# Patient Record
Sex: Female | Born: 1995
Health system: Southern US, Community
[De-identification: ages and names within clinical notes are randomized; demographics above are authoritative.]

## PROBLEM LIST (undated history)

## (undated) DIAGNOSIS — E119 Type 2 diabetes mellitus without complications: Secondary | ICD-10-CM

## (undated) DIAGNOSIS — R519 Headache, unspecified: Secondary | ICD-10-CM

## (undated) DIAGNOSIS — J353 Hypertrophy of tonsils with hypertrophy of adenoids: Secondary | ICD-10-CM

## (undated) DIAGNOSIS — R51 Headache: Secondary | ICD-10-CM

## (undated) DIAGNOSIS — R030 Elevated blood-pressure reading, without diagnosis of hypertension: Secondary | ICD-10-CM

## (undated) DIAGNOSIS — F419 Anxiety disorder, unspecified: Secondary | ICD-10-CM

## (undated) DIAGNOSIS — O24419 Gestational diabetes mellitus in pregnancy, unspecified control: Secondary | ICD-10-CM

## (undated) DIAGNOSIS — T7840XA Allergy, unspecified, initial encounter: Secondary | ICD-10-CM

## (undated) HISTORY — DX: Anxiety disorder, unspecified: F41.9

## (undated) HISTORY — PX: WISDOM TOOTH EXTRACTION: SHX21

## (undated) HISTORY — DX: Type 2 diabetes mellitus without complications: E11.9

## (undated) HISTORY — DX: Gestational diabetes mellitus in pregnancy, unspecified control: O24.419

---
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
## 2017-01-01 ENCOUNTER — Encounter: Payer: Self-pay | Admitting: Family Medicine

## 2017-02-01 ENCOUNTER — Encounter: Payer: Self-pay | Admitting: Family Medicine

## 2017-03-26 ENCOUNTER — Encounter: Payer: Self-pay | Admitting: Family Medicine

## 2017-08-03 ENCOUNTER — Other Ambulatory Visit: Payer: Self-pay

## 2017-08-03 ENCOUNTER — Ambulatory Visit (HOSPITAL_COMMUNITY)
Admission: EM | Admit: 2017-08-03 | Discharge: 2017-08-03 | Disposition: A | Discharge status: Home / Self Care | Payer: 59 | Attending: Internal Medicine | Admitting: Internal Medicine

## 2017-08-03 ENCOUNTER — Encounter (HOSPITAL_COMMUNITY): Payer: Self-pay | Admitting: *Deleted

## 2017-08-03 DIAGNOSIS — J029 Acute pharyngitis, unspecified: Secondary | ICD-10-CM | POA: Insufficient documentation

## 2017-08-03 DIAGNOSIS — Z91040 Latex allergy status: Secondary | ICD-10-CM | POA: Insufficient documentation

## 2017-08-03 DIAGNOSIS — Z833 Family history of diabetes mellitus: Secondary | ICD-10-CM | POA: Diagnosis not present

## 2017-08-03 DIAGNOSIS — Z841 Family history of disorders of kidney and ureter: Secondary | ICD-10-CM | POA: Diagnosis not present

## 2017-08-03 DIAGNOSIS — Z8249 Family history of ischemic heart disease and other diseases of the circulatory system: Secondary | ICD-10-CM | POA: Insufficient documentation

## 2017-08-03 DIAGNOSIS — R05 Cough: Secondary | ICD-10-CM | POA: Insufficient documentation

## 2017-08-03 HISTORY — DX: Headache, unspecified: R51.9

## 2017-08-03 HISTORY — DX: Headache: R51

## 2017-08-03 LAB — POCT RAPID STREP A: STREPTOCOCCUS, GROUP A SCREEN (DIRECT): NEGATIVE

## 2017-08-03 MED ORDER — BENZONATATE 200 MG PO CAPS: 200.0000 mg | ORAL_CAPSULE | Freq: Three times a day (TID) | ORAL | 1 refills | Status: DC | PRN

## 2017-08-03 NOTE — Discharge Instructions (Addendum)
Strep test was negative at the urgent care today; a throat culture is pending.  The urgent care will contact you if further treatment is needed.  If your symptoms seem most likely to be viral.  Anticipate gradual improvement over the next several days.  Recheck for new fever >100.5, increasing phlegm production/nasal discharge, or if not starting to improve in a few days.  Note for work today and tomorrow.  Prescription for benzonatate, a cough drop, was sent to the pharmacy.

## 2017-08-03 NOTE — ED Triage Notes (Signed)
C/O starting with sore throat 2 days ago with cough, painful talking.  Unsure if fevers.  Has been taking Robitussin.

## 2017-08-05 LAB — CULTURE, GROUP A STREP (THRC)

## 2017-08-07 NOTE — ED Provider Notes (Signed)
[facility]    CSN: [id number] Arrival date & time: 08/03/17  1455     History   Chief Complaint Chief Complaint  Patient presents with  . Sore Throat    HPI Susan Branch is a 21 y.o. female. presents with 2d hx irritated sore throat, cough, hoarseness.  Some congestion.  Not febrile.    HPI  Past Medical History:  Diagnosis Date  . Allergy   . Anxiety   . Borderline hypertension    no current med.  Marland Kitchen. Headache   . Tonsillar and adenoid hypertrophy 05/2013   snores during sleep, mother denies apnea    Patient Active Problem List   Diagnosis Date Noted  . Contraceptive management 06/20/2016  . Constipation 06/16/2014  . Obesity 04/22/2014  . Acne vulgaris 04/22/2014  . Migraine headache 04/22/2014  . Routine infant or child health check 04/22/2014  . Irregular menses 04/22/2014    Past Surgical History:  Procedure Laterality Date  . TONSILLECTOMY AND ADENOIDECTOMY N/A 06/02/2013   Procedure: TONSILLECTOMY AND ADENOIDECTOMY;  Surgeon: Darletta MollSui W Teoh, MD;  Location: [facility];  Service: ENT;  Laterality: N/A;  . TURBINATE REDUCTION Bilateral 06/02/2013   Procedure: TURBINATE REDUCTION BILATERAL;  Surgeon: Darletta MollSui W Teoh, MD;  Location: [facility];  Service: ENT;  Laterality: Bilateral;    OB History    No data available       Home Medications    Prior to Admission medications   Medication Sig Start Date End Date Taking? Authorizing Provider  eletriptan (RELPAX) 40 MG tablet One tablet by mouth at onset of headache. May repeat in 2 hours if headache persists or recurs. 06/20/16  Yes [place], Velna HatchetKawanta F, MD  amitriptyline (ELAVIL) 25 MG tablet Take 1 tablet (25 mg total) by mouth at bedtime. 06/20/16   [place], Velna HatchetKawanta F, MD  benzonatate (TESSALON) 200 MG capsule Take 1 capsule (200 mg total) by mouth 3 (three) times daily as needed for cough. 08/03/17   Eustace MooreMurray, Keionna Kinnaird W, MD    Family History Family History  Problem Relation  Age of Onset  . Diabetes Mother   . Diabetes Father   . Hypertension Father   . Heart disease Father        MI  . Hyperlipidemia Father   . Kidney disease Father   . Vision loss Father   . Vision loss Maternal Grandmother     Social History Social History   Tobacco Use  . Smoking status: Passive Smoke Exposure - Never Smoker  . Smokeless tobacco: Never Used  . Tobacco comment: father smokes outside  Substance Use Topics  . Alcohol use: No  . Drug use: No     Allergies   Adhesive [tape]; Latex; and Soap   Review of Systems Review of Systems  All other systems reviewed and are negative.    Physical Exam Triage Vital Signs ED Triage Vitals [08/03/17 1551]  Enc Vitals Group     BP 126/81     Pulse Rate 71     Resp 16     Temp 98.2 F (36.8 C)     Temp Source Oral     SpO2 100 %     Weight      Height      Head Circumference      Peak Flow      Pain Score 4     Pain Loc      Pain Edu?  Excl. in GC?    No data found.  Updated Vital Signs BP 126/81   Pulse 71   Temp 98.2 F (36.8 C) (Oral)   Resp 16   LMP 07/03/2017 (Approximate)   SpO2 100%   Visual Acuity Right Eye Distance:   Left Eye Distance:   Bilateral Distance:    Right Eye Near:   Left Eye Near:    Bilateral Near:     Physical Exam  Constitutional: She is oriented to person, place, and time. No distress.  HENT:  Head: Atraumatic.  B TMs mildly dull, no erythema Mild to mod nasal congestion Throat slighly injected   Eyes:  Conjugate gaze observed, no eye redness/discharge  Neck: Neck supple.  Cardiovascular: Normal rate and regular rhythm.  Pulmonary/Chest: No respiratory distress. She has no wheezes. She has no rales.  Lungs clear, symmetric breath sounds   Abdominal: She exhibits no distension.  Musculoskeletal: Normal range of motion.  Neurological: She is alert and oriented to person, place, and time.  Skin: Skin is warm and dry.  Nursing note and vitals  reviewed.    UC Treatments / Results  Labs Results for orders placed or performed during the hospital encounter of 08/03/17  Culture, group A strep  Result Value Ref Range   Specimen Description THROAT    Special Requests NONE    Culture NO GROUP A STREP (S.PYOGENES) ISOLATED    Report Status 08/05/2017 FINAL   POCT rapid strep A [facility]  Result Value Ref Range   Streptococcus, Group A Screen (Direct) NEGATIVE NEGATIVE    Procedures Procedures (including critical care time) None today  Final Clinical Impressions(s) / UC Diagnoses   Final diagnoses:  Acute pharyngitis, unspecified etiology   Strep test was negative at the urgent care today; a throat culture is pending.  The urgent care will contact you if further treatment is needed.  If your symptoms seem most likely to be viral.  Anticipate gradual improvement over the next several days.  Recheck for new fever >100.5, increasing phlegm production/nasal discharge, or if not starting to improve in a few days.  Note for work today and tomorrow.  Prescription for benzonatate, a cough drop, was sent to the pharmacy.  ED Discharge Orders        Ordered    benzonatate (TESSALON) 200 MG capsule  3 times daily PRN     08/03/17 1707       Controlled Substance Prescriptions [place] Controlled Substance Registry consulted? No   Eustace MooreMurray, Josetta Wigal W, MD 08/07/17 [id number]

## 2017-09-25 ENCOUNTER — Encounter: Payer: Self-pay | Admitting: Family Medicine

## 2017-09-25 ENCOUNTER — Other Ambulatory Visit: Payer: Self-pay

## 2017-09-25 ENCOUNTER — Ambulatory Visit (INDEPENDENT_AMBULATORY_CARE_PROVIDER_SITE_OTHER): Payer: 59 | Admitting: Family Medicine

## 2017-09-25 VITALS — BP 128/74 | HR 76 | Temp 98.7°F | Resp 16 | Ht 64.0 in | Wt 251.0 lb

## 2017-09-25 DIAGNOSIS — F321 Major depressive disorder, single episode, moderate: Secondary | ICD-10-CM

## 2017-09-25 DIAGNOSIS — Z6841 Body Mass Index (BMI) 40.0 and over, adult: Secondary | ICD-10-CM | POA: Diagnosis not present

## 2017-09-25 DIAGNOSIS — Z0000 Encounter for general adult medical examination without abnormal findings: Secondary | ICD-10-CM | POA: Diagnosis not present

## 2017-09-25 DIAGNOSIS — G43009 Migraine without aura, not intractable, without status migrainosus: Secondary | ICD-10-CM

## 2017-09-25 DIAGNOSIS — Z30011 Encounter for initial prescription of contraceptive pills: Secondary | ICD-10-CM | POA: Diagnosis not present

## 2017-09-25 DIAGNOSIS — E7439 Other disorders of intestinal carbohydrate absorption: Secondary | ICD-10-CM | POA: Diagnosis not present

## 2017-09-25 DIAGNOSIS — M222X2 Patellofemoral disorders, left knee: Secondary | ICD-10-CM | POA: Diagnosis not present

## 2017-09-25 DIAGNOSIS — M222X1 Patellofemoral disorders, right knee: Secondary | ICD-10-CM

## 2017-09-25 DIAGNOSIS — Z23 Encounter for immunization: Secondary | ICD-10-CM | POA: Diagnosis not present

## 2017-09-25 LAB — PREGNANCY, URINE: Preg Test, Ur: NEGATIVE

## 2017-09-25 MED ORDER — NORGESTIMATE-ETH ESTRADIOL 0.25-35 MG-MCG PO TABS: 1.0000 | ORAL_TABLET | Freq: Every day | ORAL | 11 refills | Status: DC

## 2017-09-25 MED ORDER — ELETRIPTAN HYDROBROMIDE 40 MG PO TABS: ORAL_TABLET | ORAL | 2 refills | Status: DC

## 2017-09-25 NOTE — Patient Instructions (Addendum)
F/U 3 months  We will call with orders Referral to therapy  Restart the birth control pill Work on diet, try not to eat a burgers/frieds, fried foods
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
## 2017-09-25 NOTE — Assessment & Plan Note (Signed)
Irregular menses which is long-term for her.  Urine pregnancy test negative here in the office.  Will restart Sprintec which worked for her before.

## 2017-09-25 NOTE — Assessment & Plan Note (Signed)
She finds medications.  Believe she is afraid because of her father and his response to medications and the fact that he has bipolar.  She has also the stress of not being able to go back to college until the summer.  She is in agreement with seeing a psychotherapist.  Then to discontinue the amitriptyline because of the side effects.

## 2017-09-25 NOTE — Assessment & Plan Note (Signed)
Utilize Relpax as needed

## 2017-09-26 LAB — CBC WITH DIFFERENTIAL/PLATELET
BASOS PCT: 0.5 %
Basophils Absolute: 53 cells/uL (ref 0–200)
Eosinophils Absolute: 158 cells/uL (ref 15–500)
Eosinophils Relative: 1.5 %
HCT: 41.5 % (ref 35.0–45.0)
HEMOGLOBIN: 13.5 g/dL (ref 11.7–15.5)
Lymphs Abs: 2867 cells/uL (ref 850–3900)
MCH: 27.6 pg (ref 27.0–33.0)
MCHC: 32.5 g/dL (ref 32.0–36.0)
MCV: 84.9 fL (ref 80.0–100.0)
MPV: 11.1 fL (ref 7.5–12.5)
Monocytes Relative: 6.5 %
Neutro Abs: 6741 cells/uL (ref 1500–7800)
Neutrophils Relative %: 64.2 %
Platelets: 408 10*3/uL — ABNORMAL HIGH (ref 140–400)
RBC: 4.89 10*6/uL (ref 3.80–5.10)
RDW: 12.6 % (ref 11.0–15.0)
TOTAL LYMPHOCYTE: 27.3 %
WBC: 10.5 10*3/uL (ref 3.8–10.8)
WBCMIX: 683 {cells}/uL (ref 200–950)

## 2017-09-26 LAB — COMPREHENSIVE METABOLIC PANEL
AG Ratio: 1.5 (calc) (ref 1.0–2.5)
ALT: 12 U/L (ref 5–32)
AST: 12 U/L (ref 12–32)
Albumin: 4.2 g/dL (ref 3.6–5.1)
Alkaline phosphatase (APISO): 75 U/L (ref 47–176)
BUN: 15 mg/dL (ref 7–20)
CO2: 27 mmol/L (ref 20–32)
Calcium: 9.4 mg/dL (ref 8.9–10.4)
Chloride: 104 mmol/L (ref 98–110)
Creat: 0.77 mg/dL (ref 0.50–1.00)
GLUCOSE: 99 mg/dL (ref 65–99)
Globulin: 2.8 g/dL (calc) (ref 2.0–3.8)
Potassium: 4.1 mmol/L (ref 3.8–5.1)
SODIUM: 139 mmol/L (ref 135–146)
TOTAL PROTEIN: 7 g/dL (ref 6.3–8.2)
Total Bilirubin: 0.3 mg/dL (ref 0.2–1.1)

## 2017-09-26 LAB — LIPID PANEL
CHOLESTEROL: 192 mg/dL — AB (ref <170)
HDL: 45 mg/dL — ABNORMAL LOW (ref >45)
LDL CHOLESTEROL (CALC): 130 mg/dL — AB (ref <110)
Non-HDL Cholesterol (Calc): 147 mg/dL (calc) — ABNORMAL HIGH (ref <120)
TRIGLYCERIDES: 75 mg/dL (ref <90)
Total CHOL/HDL Ratio: 4.3 (calc) (ref <5.0)

## 2017-09-26 LAB — HEMOGLOBIN A1C
EAG (MMOL/L): 6.8 (calc)
Hgb A1c MFr Bld: 5.9 % of total Hgb — ABNORMAL HIGH (ref <5.7)
Mean Plasma Glucose: 123 (calc)

## 2017-09-26 LAB — TSH: TSH: 2.06 mIU/L

## 2017-10-03 ENCOUNTER — Other Ambulatory Visit: Payer: Self-pay | Admitting: *Deleted

## 2017-10-03 MED ORDER — METFORMIN HCL 500 MG PO TABS: 500.0000 mg | ORAL_TABLET | Freq: Every day | ORAL | 3 refills | Status: DC

## 2017-10-30 ENCOUNTER — Other Ambulatory Visit: Payer: Self-pay

## 2017-10-30 ENCOUNTER — Encounter: Payer: Self-pay | Admitting: Family Medicine

## 2017-10-30 ENCOUNTER — Ambulatory Visit (INDEPENDENT_AMBULATORY_CARE_PROVIDER_SITE_OTHER): Payer: 59 | Admitting: Family Medicine

## 2017-10-30 ENCOUNTER — Telehealth: Payer: Self-pay

## 2017-10-30 DIAGNOSIS — S161XXA Strain of muscle, fascia and tendon at neck level, initial encounter: Secondary | ICD-10-CM

## 2017-10-30 MED ORDER — CYCLOBENZAPRINE HCL 5 MG PO TABS: 5.0000 mg | ORAL_TABLET | Freq: Three times a day (TID) | ORAL | 0 refills | Status: DC | PRN

## 2017-10-30 MED ORDER — IBUPROFEN 600 MG PO TABS: 600.0000 mg | ORAL_TABLET | Freq: Three times a day (TID) | ORAL | 0 refills | Status: DC | PRN

## 2017-10-30 NOTE — Patient Instructions (Addendum)
Take ibuprofen for pain  Use heating pad Muscle relaxer F/U as needed

## 2017-10-30 NOTE — Telephone Encounter (Signed)
Call placed to patient to inform her that her [employer] acknowledgement of donor Participation in the Plasmapheresis form is available for pick up at the front desk. LVM
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
## 2017-12-25 ENCOUNTER — Ambulatory Visit: Payer: 59 | Admitting: Family Medicine

## 2018-02-14 ENCOUNTER — Other Ambulatory Visit: Payer: Self-pay | Admitting: Family Medicine

## 2018-04-18 ENCOUNTER — Encounter: Payer: Self-pay | Admitting: Family Medicine

## 2018-04-18 ENCOUNTER — Ambulatory Visit (INDEPENDENT_AMBULATORY_CARE_PROVIDER_SITE_OTHER): Payer: 59 | Admitting: Family Medicine

## 2018-04-18 ENCOUNTER — Other Ambulatory Visit: Payer: Self-pay

## 2018-04-18 VITALS — BP 128/68 | HR 80 | Temp 98.4°F | Resp 12 | Ht 64.0 in | Wt 235.0 lb

## 2018-04-18 DIAGNOSIS — Z30017 Encounter for initial prescription of implantable subdermal contraceptive: Secondary | ICD-10-CM

## 2018-04-18 DIAGNOSIS — Z6841 Body Mass Index (BMI) 40.0 and over, adult: Secondary | ICD-10-CM

## 2018-04-18 DIAGNOSIS — R7303 Prediabetes: Secondary | ICD-10-CM | POA: Diagnosis not present

## 2018-04-18 DIAGNOSIS — Z113 Encounter for screening for infections with a predominantly sexual mode of transmission: Secondary | ICD-10-CM

## 2018-04-18 DIAGNOSIS — G43009 Migraine without aura, not intractable, without status migrainosus: Secondary | ICD-10-CM

## 2018-04-18 DIAGNOSIS — Z3009 Encounter for other general counseling and advice on contraception: Secondary | ICD-10-CM | POA: Diagnosis not present

## 2018-04-18 LAB — PREGNANCY, URINE: PREG TEST UR: NEGATIVE

## 2018-04-18 MED ORDER — PROPRANOLOL HCL ER 60 MG PO CP24: 60.0000 mg | ORAL_CAPSULE | Freq: Every day | ORAL | 3 refills | Status: DC

## 2018-04-18 MED ORDER — CYCLOBENZAPRINE HCL 10 MG PO TABS: 10.0000 mg | ORAL_TABLET | Freq: Three times a day (TID) | ORAL | 0 refills | Status: DC | PRN

## 2018-04-18 MED ORDER — KETOROLAC TROMETHAMINE 60 MG/2ML IM SOLN
60.0000 mg | Freq: Once | INTRAMUSCULAR | Status: AC
  Administered 2018-04-18: 60 mg via INTRAMUSCULAR

## 2018-04-18 NOTE — Assessment & Plan Note (Signed)
Has lost some weight.  She is cutting back on portion size.  Continue to cut out the junk food she does have borderline diabetes.  We will recheck her A1c she has strong family history of diabetes and parents

## 2018-04-18 NOTE — Patient Instructions (Signed)
Take inderal once a day  Stop the amitriptyline Use muscle relaxer as needed Toradol given for headache We will call with lab results  Referral to GYN for Nexplanon placement  F/U 2 months

## 2018-04-18 NOTE — Assessment & Plan Note (Addendum)
Side effect with Topamax.  The amitriptyline is not helping.  She wants something that will not make her sleepy as she will be restarting college.  We will try her on Inderal once a day Flexeril as needed for the tension migraines that she gets in neck discomfort.  Relpax will still be her breakthrough medication.  Discussed increasing her hydration cutting out the junk food salty foods that could be triggers.  Toradol given in office

## 2018-04-18 NOTE — Progress Notes (Signed)
Subjective:    Patient ID: Susan Branch, female    DOB: 01-Feb-1996, 22 y.o.   MRN: [id number]  Patient presents for Migraine (frequent HA) and Contraceptive Management (has not been taking her OBC but would like to resume) Pt here to discuss migraines   Headaches have been getting worse past few months. Has been using Relpax. If she gets severe migraine gets blurry vision/ photophobic, unable to function.   She had 1 event she was at an am[place]ement park and vision went black , she ate something and rested and it improved. She is on elavil this was given by [facility] wellness a couple years ago. Tried topamax in past side effects Has a headache currently, has had for past 5 days, no vomiting, +nausea Gets tension in back of head    NOt on metformin has borderline DM   Attends [employer]   Weight continues to decresae, she has cut down portions    Sexually active 1 partner, wants to restart her birth control   Review Of Systems:  GEN- denies fatigue, fever, weight loss,weakness, recent illness HEENT- denies eye drainage, change in vision, nasal discharge, CVS- denies chest pain, palpitations RESP- denies SOB, cough, wheeze ABD- denies N/V, change in stools, abd pain GU- denies dysuria, hematuria, dribbling, incontinence MSK- denies joint pain, muscle aches, injury Neuro- + headache, dizziness, syncope, seizure activity       Objective:    BP 128/68   Pulse 80   Temp 98.4 F (36.9 C) (Oral)   Resp 12   Ht 5\' 4"  (1.626 m)   Wt 235 lb (106.6 kg)   SpO2 97%   BMI 40.34 kg/m  GEN- NAD, alert and oriented x3 HEENT- PERRL, EOMI, non injected sclera, pink conjunctiva, MMM, oropharynx clear Neck- Supple, no thyromegaly CVS- RRR, no murmur RESP-CTAB Neuro-CNII-XII in tact no focal deficits  EXT- No edema Pulses- Radial, DP- 2+        Assessment & Plan:      Problem List Items Addressed This Visit      Unprioritized   Borderline diabetes   Relevant Orders   Basic metabolic panel   Hemoglobin A1c   Contraceptive management - Primary   Relevant Orders   Pregnancy, urine (Completed)   Ambulatory referral to Gynecology   Migraine headache    Side effect with Topamax.  The amitriptyline is not helping.  She wants something that will not make her sleepy as she will be restarting college.  We will try her on Inderal once a day Flexeril as needed for the tension migraines that she gets in neck discomfort.  Relpax will still be her breakthrough medication.  Discussed increasing her hydration cutting out the junk food salty foods that could be triggers.  Toradol given in office      Relevant Medications   cyclobenzaprine (FLEXERIL) 10 MG tablet   propranolol ER (INDERAL LA) 60 MG 24 hr capsule   ketorolac (TORADOL) injection 60 mg (Completed)   Obesity    Has lost some weight.  She is cutting back on portion size.  Continue to cut out the junk food she does have borderline diabetes.  We will recheck her A1c she has strong family history of diabetes and parents       Other Visit Diagnoses    Screen for STD (sexually transmitted disease)       Relevant Orders   C. trachomatis/N. gonorrhoeae RNA   HIV antibody   RPR  Note: This dictation was prepared with Dragon dictation along with smaller phrase technology. Any transcriptional errors that result from this process are unintentional.

## 2018-04-21 LAB — HEMOGLOBIN A1C
EAG (MMOL/L): 6.6 (calc)
Hgb A1c MFr Bld: 5.8 % of total Hgb — ABNORMAL HIGH (ref <5.7)
Mean Plasma Glucose: 120 (calc)

## 2018-04-21 LAB — HIV ANTIBODY (ROUTINE TESTING W REFLEX): HIV: NONREACTIVE

## 2018-04-21 LAB — BASIC METABOLIC PANEL
BUN: 11 mg/dL (ref 7–25)
CO2: 27 mmol/L (ref 20–32)
Calcium: 9.3 mg/dL (ref 8.6–10.2)
Chloride: 102 mmol/L (ref 98–110)
Creat: 0.93 mg/dL (ref 0.50–1.10)
Glucose, Bld: 100 mg/dL — ABNORMAL HIGH (ref 65–99)
Potassium: 4.1 mmol/L (ref 3.5–5.3)
SODIUM: 141 mmol/L (ref 135–146)

## 2018-04-21 LAB — C. TRACHOMATIS/N. GONORRHOEAE RNA
C. trachomatis RNA, TMA: NOT DETECTED
N. gonorrhoeae RNA, TMA: NOT DETECTED

## 2018-04-21 LAB — RPR: RPR: NONREACTIVE

## 2018-04-25 ENCOUNTER — Encounter: Payer: Self-pay | Admitting: *Deleted

## 2018-04-25 MED ORDER — METFORMIN HCL 500 MG PO TABS: 500.0000 mg | ORAL_TABLET | Freq: Every day | ORAL | 3 refills | Status: DC

## 2018-05-06 ENCOUNTER — Encounter: Payer: Self-pay | Admitting: Obstetrics and Gynecology

## 2018-05-06 ENCOUNTER — Other Ambulatory Visit: Payer: Self-pay

## 2018-05-06 ENCOUNTER — Ambulatory Visit: Payer: 59 | Admitting: Obstetrics and Gynecology

## 2018-05-06 VITALS — BP 137/92 | HR 79 | Ht 64.0 in | Wt 233.0 lb

## 2018-05-06 DIAGNOSIS — Z3009 Encounter for other general counseling and advice on contraception: Secondary | ICD-10-CM

## 2018-05-06 NOTE — Progress Notes (Signed)
[facility] Visit  05/06/18            Patient name: Susan Branch MRN [id number]  Date of birth: 30-Apr-1996  CC & HPI:  Susan Branch is a 22 y.o. female presenting today to discuss various types of contraception methods. She voices that she would like a nexplanon. She has been using condoms as her birth control method at this time. Patient's last menstrual period was 04/22/2018 (within days). She denies any other symptoms.    ROS:  ROS  All systems are negative except as noted in the HPI and PMH.   Pertinent History Reviewed:   Reviewed: Significant for borderline HTN Medical         Past Medical History:  Diagnosis Date  . Allergy   . Anxiety   . Borderline hypertension    no current med.  Marland Kitchen. Headache   . Tonsillar and adenoid hypertrophy 05/2013   snores during sleep, mother denies apnea                              Surgical Hx:    Past Surgical History:  Procedure Laterality Date  . TONSILLECTOMY AND ADENOIDECTOMY N/A 06/02/2013   Procedure: TONSILLECTOMY AND ADENOIDECTOMY;  Surgeon: Darletta MollSui W Teoh, MD;  Location: [facility];  Service: ENT;  Laterality: N/A;  . TURBINATE REDUCTION Bilateral 06/02/2013   Procedure: TURBINATE REDUCTION BILATERAL;  Surgeon: Darletta MollSui W Teoh, MD;  Location: [facility];  Service: ENT;  Laterality: Bilateral;   Medications: Reviewed & Updated - see associated section                       Current Outpatient Medications:  .  cyclobenzaprine (FLEXERIL) 10 MG tablet, Take 1 tablet (10 mg total) by mouth 3 (three) times daily as needed for muscle spasms., Disp: 30 tablet, Rfl: 0 .  eletriptan (RELPAX) 40 MG tablet, One tablet by mouth at onset of headache. May repeat in 2 hours if headache persists or recurs., Disp: 10 tablet, Rfl: 2 .  propranolol ER (INDERAL LA) 60 MG 24 hr capsule, Take 1 capsule (60 mg total) by mouth daily. For migraines, Disp: 30 capsule, Rfl: 3 .  metFORMIN (GLUCOPHAGE) 500 MG tablet, Take 1  tablet (500 mg total) by mouth daily with breakfast. (Patient not taking: Reported on 05/06/2018), Disp: 30 tablet, Rfl: 3   Social History: Reviewed -  reports that she is a non-smoker but has been exposed to tobacco smoke. She has never used smokeless tobacco.  Objective Findings:  Vitals: Blood pressure (!) 137/92, pulse 79, height 5\' 4"  (1.626 m), weight 233 lb (105.7 kg), last menstrual period 04/22/2018.  PHYSICAL EXAMINATION General appearance - alert, well appearing, and in no distress and oriented to person, place, and time Mental status - alert, oriented to person, place, and time, normal mood, behavior, speech, dress, motor activity, and thought processes  Discussion: 1. Discussed with pt risks and benefits of contraceptive management, nexplanon candidate  At end of discussion, pt had opportunity to ask questions and has no further questions at this time.   Specific discussion of contraceptive management, nexplanon candidate as noted above. Greater than 50% was spent in counseling and coordination of care with the patient.   Total time greater than: 15 minutes.      Assessment & Plan:   A:  1.  Contraception management, nexplanon candidate  P:  1. Obtain nexplanon through insurance at next menses in 2 weeks     By signing my name below, I, Soijett Blue, attest that this documentation has been prepared under the direction and in the presence of Tilda Burrow, MD. Electronically Signed: Soijett Blue, Stage manager. 05/06/18. 11:26 AM.  I personally performed the services described in this documentation, which was SCRIBED in my presence. The recorded information has been reviewed and considered accurate. It has been edited as necessary during review. Tilda Burrow, MD

## 2018-05-16 ENCOUNTER — Ambulatory Visit: Payer: 59 | Admitting: Obstetrics and Gynecology

## 2018-05-16 ENCOUNTER — Encounter: Payer: Self-pay | Admitting: Obstetrics and Gynecology

## 2018-05-16 VITALS — BP 139/88 | HR 77 | Ht 64.0 in | Wt 236.4 lb

## 2018-05-16 DIAGNOSIS — Z3046 Encounter for surveillance of implantable subdermal contraceptive: Secondary | ICD-10-CM | POA: Diagnosis not present

## 2018-05-16 DIAGNOSIS — Z3202 Encounter for pregnancy test, result negative: Secondary | ICD-10-CM | POA: Diagnosis not present

## 2018-05-16 DIAGNOSIS — Z30017 Encounter for initial prescription of implantable subdermal contraceptive: Secondary | ICD-10-CM

## 2018-05-16 LAB — POCT URINE PREGNANCY: PREG TEST UR: NEGATIVE

## 2018-05-16 MED ORDER — ETONOGESTREL 68 MG ~~LOC~~ IMPL
68.0000 mg | DRUG_IMPLANT | Freq: Once | SUBCUTANEOUS | Status: AC
  Administered 2018-05-16: 68 mg via SUBCUTANEOUS

## 2018-05-16 NOTE — Progress Notes (Signed)
Patient ID: Susan Branch, female   DOB: Jul 22, 1996, 22 y.o.   MRN: [id number]     GYNECOLOGY OFFICE PROCEDURE NOTE  TYAH MERY is a 22 y.o. G0P0000 here for Nexplanon insertion.  No pap under 21.  No other gynecologic concerns. Has been reliant on condoms and pregnancy test was negative today. Her LMP was around 04/22/18. Reliable contraception , neg pregnancy test  Nexplanon Insertion Procedure Patient identified, informed consent performed, consent signed.   Patient does understand that irregular bleeding is a very common side effect of this medication. She was advised to have backup contraception for one week after placement. Pregnancy test in clinic today was negative.  Appropriate time out taken.  Patient's left arm was prepped and draped in the usual sterile fashion. The ruler used to measure and mark insertion area.  Patient was prepped with alcohol swab and then injected with 3 cc of 1% lidocaine.  She was prepped with betadine, Nexplanon removed from packaging,  Device confirmed in needle, then inserted full length of needle and withdrawn per handbook instructions. Nexplanon was able to palpated in the patient's arm; patient palpated the insert herself. There was minimal blood loss.  Patient insertion site covered with guaze and a pressure bandage to reduce any bruising.  The patient tolerated the procedure well and was given post procedure instructions.     By signing my name below, I, Arnette Norris, attest that this documentation has been prepared under the direction and in the presence of Tilda Burrow, MD. Electronically Signed: Arnette Norris Medical Scribe. 05/16/18. 12:05 PM.  I personally performed the services described in this documentation, which was SCRIBED in my presence. The recorded information has been reviewed and considered accurate. It has been edited as necessary during review. Tilda Burrow, MD

## 2018-06-18 ENCOUNTER — Ambulatory Visit: Payer: 59 | Admitting: Physician Assistant

## 2018-07-21 ENCOUNTER — Encounter (HOSPITAL_COMMUNITY): Payer: Self-pay

## 2018-07-21 ENCOUNTER — Ambulatory Visit (HOSPITAL_COMMUNITY)
Admission: EM | Admit: 2018-07-21 | Discharge: 2018-07-21 | Disposition: A | Discharge status: Home / Self Care | Payer: 59 | Attending: Family Medicine | Admitting: Family Medicine

## 2018-07-21 ENCOUNTER — Other Ambulatory Visit: Payer: Self-pay

## 2018-07-21 DIAGNOSIS — B9789 Other viral agents as the cause of diseases classified elsewhere: Secondary | ICD-10-CM | POA: Diagnosis not present

## 2018-07-21 DIAGNOSIS — Z833 Family history of diabetes mellitus: Secondary | ICD-10-CM | POA: Diagnosis not present

## 2018-07-21 DIAGNOSIS — E669 Obesity, unspecified: Secondary | ICD-10-CM | POA: Diagnosis not present

## 2018-07-21 DIAGNOSIS — F419 Anxiety disorder, unspecified: Secondary | ICD-10-CM | POA: Diagnosis not present

## 2018-07-21 DIAGNOSIS — J029 Acute pharyngitis, unspecified: Secondary | ICD-10-CM | POA: Diagnosis present

## 2018-07-21 DIAGNOSIS — J069 Acute upper respiratory infection, unspecified: Secondary | ICD-10-CM | POA: Insufficient documentation

## 2018-07-21 DIAGNOSIS — G43909 Migraine, unspecified, not intractable, without status migrainosus: Secondary | ICD-10-CM | POA: Insufficient documentation

## 2018-07-21 DIAGNOSIS — Z7984 Long term (current) use of oral hypoglycemic drugs: Secondary | ICD-10-CM | POA: Insufficient documentation

## 2018-07-21 DIAGNOSIS — Z7722 Contact with and (suspected) exposure to environmental tobacco smoke (acute) (chronic): Secondary | ICD-10-CM | POA: Diagnosis not present

## 2018-07-21 DIAGNOSIS — Z79899 Other long term (current) drug therapy: Secondary | ICD-10-CM | POA: Insufficient documentation

## 2018-07-21 DIAGNOSIS — R7303 Prediabetes: Secondary | ICD-10-CM | POA: Diagnosis not present

## 2018-07-21 DIAGNOSIS — N760 Acute vaginitis: Secondary | ICD-10-CM | POA: Diagnosis not present

## 2018-07-21 DIAGNOSIS — Z888 Allergy status to other drugs, medicaments and biological substances status: Secondary | ICD-10-CM | POA: Insufficient documentation

## 2018-07-21 DIAGNOSIS — I10 Essential (primary) hypertension: Secondary | ICD-10-CM | POA: Insufficient documentation

## 2018-07-21 DIAGNOSIS — F329 Major depressive disorder, single episode, unspecified: Secondary | ICD-10-CM | POA: Diagnosis not present

## 2018-07-21 DIAGNOSIS — Z8249 Family history of ischemic heart disease and other diseases of the circulatory system: Secondary | ICD-10-CM | POA: Insufficient documentation

## 2018-07-21 MED ORDER — DM-GUAIFENESIN ER 30-600 MG PO TB12: 2.0000 | ORAL_TABLET | Freq: Two times a day (BID) | ORAL | 0 refills | Status: AC

## 2018-07-21 MED ORDER — MOMETASONE FUROATE 50 MCG/ACT NA SUSP
2.0000 | Freq: Every day | NASAL | 12 refills | Status: DC
Start: 2018-07-21 — End: 2020-05-04

## 2018-07-21 MED ORDER — ACETAMINOPHEN 500 MG PO TABS: 500.0000 mg | ORAL_TABLET | Freq: Four times a day (QID) | ORAL | 0 refills | Status: DC | PRN

## 2018-07-21 NOTE — ED Triage Notes (Addendum)
Pt  Co sore throat and cough x 4 days. Pt states she would like to be tested for STDs , pt states she thinks she has a yeast infection 3 days.

## 2018-07-21 NOTE — Discharge Instructions (Signed)
Push fluids to ensure adequate hydration and keep secretions thin.  Tylenol and/or ibuprofen as needed for pain or fevers.  Mucinex dm may help with cough and secretions.  Daily nasal spray.  Will notify you of any positive findings from your vaginal swab and if any changes to treatment are needed.   If symptoms worsen or do not improve in the next week to return to be seen or to follow up with your PCP.

## 2018-07-21 NOTE — ED Provider Notes (Signed)
[facility]    CSN: [id number] Arrival date & time: 07/21/18  1115     History   Chief Complaint Chief Complaint  Patient presents with  . Cough  . Sore Throat    HPI CHINYERE GALIANO is a 22 y.o. female.   Ashyia presents with complaints of sore throat, headache, cough and congestion which started 3-4 days ago. Thick congestion. Non productive cough. No known fevers but felt warm yesterday. No gi/gu complaints. No rash or ear pain. No known ill contacts. Has taken OTC cold RX and benadryl which have minimally helped. Sore throat is worse at night and when she wakes. No asthma history. Smokes either marijuana or black and milds daily. Also states she has vaginal itching. No known discharge. Has two partners. Denies  Concerns for STD's. No sores or lesions. Has had some vaginal spotting since her period last week. Her periods have been irregular since getting a nexplanon implant in September. No abdominal pain. Hx of allergies, htn, headaches, depression, irregular menses, tonsillectomy.     ROS per HPI.      Past Medical History:  Diagnosis Date  . Allergy   . Anxiety   . Borderline hypertension    no current med.  Marland Kitchen Headache   . Tonsillar and adenoid hypertrophy 05/2013   snores during sleep, mother denies apnea    Patient Active Problem List   Diagnosis Date Noted  . Borderline diabetes 04/18/2018  . Depression, major, single episode, moderate (HCC) 09/25/2017  . Contraceptive management 06/20/2016  . Constipation 06/16/2014  . Obesity 04/22/2014  . Acne vulgaris 04/22/2014  . Migraine headache 04/22/2014  . Routine infant or child health check 04/22/2014  . Irregular menses 04/22/2014    Past Surgical History:  Procedure Laterality Date  . TONSILLECTOMY AND ADENOIDECTOMY N/A 06/02/2013   Procedure: TONSILLECTOMY AND ADENOIDECTOMY;  Surgeon: [name], MD;  Location: [facility];  Service: ENT;  Laterality: N/A;  . TURBINATE REDUCTION  Bilateral 06/02/2013   Procedure: TURBINATE REDUCTION BILATERAL;  Surgeon: [name], MD;  Location: [facility];  Service: ENT;  Laterality: Bilateral;    OB History    Gravida  0   Para  0   Term  0   Preterm  0   AB  0   Living  0     SAB  0   TAB  0   Ectopic  0   Multiple  0   Live Births  0            Home Medications    Prior to Admission medications   Medication Sig Start Date End Date Taking? Authorizing Provider  acetaminophen (TYLENOL) 500 MG tablet Take 1 tablet (500 mg total) by mouth every 6 (six) hours as needed. 07/21/18   Georgetta Haber, NP  dextromethorphan-guaiFENesin (MUCINEX DM) 30-600 MG 12hr tablet Take 2 tablets by mouth 2 (two) times daily for 5 days. 07/21/18 07/26/18  Georgetta Haber, NP  eletriptan (RELPAX) 40 MG tablet One tablet by mouth at onset of headache. May repeat in 2 hours if headache persists or recurs. 09/25/17   Salley Scarlet, MD  metFORMIN (GLUCOPHAGE) 500 MG tablet Take 1 tablet (500 mg total) by mouth daily with breakfast. Patient not taking: Reported on 05/06/2018 04/25/18   Salley Scarlet, MD  mometasone (NASONEX) 50 MCG/ACT nasal spray Place 2 sprays into the nose daily. 07/21/18   Georgetta Haber, NP  propranolol ER (INDERAL LA) 60 MG 24 hr capsule Take 1 capsule (60 mg total) by mouth daily. For migraines 04/18/18   Salley Scarlet, MD    Family History Family History  Problem Relation Age of Onset  . Diabetes Mother   . Diabetes Father   . Hypertension Father   . Heart disease Father        MI  . Hyperlipidemia Father   . Kidney disease Father   . Vision loss Father   . Vision loss Maternal Grandmother     Social History Social History   Tobacco Use  . Smoking status: Passive Smoke Exposure - Never Smoker  . Smokeless tobacco: Never Used  . Tobacco comment: father smokes outside  Substance Use Topics  . Alcohol use: No  . Drug use: Yes    Types: Marijuana     Allergies     Adhesive [tape]; Latex; and Soap   Review of Systems Review of Systems   Physical Exam Triage Vital Signs ED Triage Vitals  Enc Vitals Group     BP 07/21/18 1129 131/83     Pulse Rate 07/21/18 1129 79     Resp 07/21/18 1129 18     Temp 07/21/18 1129 98.8 F (37.1 C)     Temp Source 07/21/18 1129 Oral     SpO2 07/21/18 1129 100 %     Weight 07/21/18 1127 240 lb (108.9 kg)     Height --      Head Circumference --      Peak Flow --      Pain Score 07/21/18 1127 3     Pain Loc --      Pain Edu? --      Excl. in GC? --    No data found.  Updated Vital Signs BP 131/83 (BP Location: Right Arm)   Pulse 79   Temp 98.8 F (37.1 C) (Oral)   Resp 18   Wt 240 lb (108.9 kg)   LMP 07/14/2018   SpO2 100%   BMI 41.20 kg/m    Physical Exam  Constitutional: She is oriented to person, place, and time. She appears well-developed and well-nourished. No distress.  HENT:  Head: Normocephalic and atraumatic.  Right Ear: Tympanic membrane, external ear and ear canal normal.  Left Ear: Tympanic membrane, external ear and ear canal normal.  Nose: Nose normal.  Mouth/Throat: Uvula is midline and mucous membranes are normal. Posterior oropharyngeal erythema present. Tonsils are 0 on the right. Tonsils are 0 on the left. No tonsillar exudate.  Eyes: Pupils are equal, round, and reactive to light. Conjunctivae and EOM are normal.  Cardiovascular: Normal rate, regular rhythm and normal heart sounds.  Pulmonary/Chest: Effort normal and breath sounds normal.  Abdominal: Soft. There is no tenderness. There is no rigidity, no rebound, no guarding and no CVA tenderness.  Genitourinary:  Genitourinary Comments: Denies sores, lesions, vaginal bleeding; no pelvic pain; gu exam deferred at this time, vaginal self swab collected.    Neurological: She is alert and oriented to person, place, and time.  Skin: Skin is warm and dry.     UC Treatments / Results  Labs (all labs ordered are listed, but  only abnormal results are displayed) Labs Reviewed  CERVICOVAGINAL ANCILLARY ONLY    EKG None  Radiology No results found.  Procedures Procedures (including critical care time)  Medications Ordered in UC Medications - No data to display  Initial Impression / Assessment and Plan / UC Course  I have reviewed the triage vital signs and the nursing notes.  Pertinent labs & imaging results that were available during my care of the patient were reviewed by me and considered in my medical decision making (see chart for details).     Non toxic. Afebrile. Benign physical exam.  History and physical consistent with viral illness.  Supportive cares recommended. Vaginal cytology pending. Will notify of any positive findings and if any changes to treatment are needed.  If symptoms worsen or do not improve in the next week to return to be seen or to follow up with PCP.  Patient verbalized understanding and agreeable to plan.   Final Clinical Impressions(s) / UC Diagnoses   Final diagnoses:  Viral URI with cough  Acute vaginitis     Discharge Instructions     Push fluids to ensure adequate hydration and keep secretions thin.  Tylenol and/or ibuprofen as needed for pain or fevers.  Mucinex dm may help with cough and secretions.  Daily nasal spray.  Will notify you of any positive findings from your vaginal swab and if any changes to treatment are needed.   If symptoms worsen or do not improve in the next week to return to be seen or to follow up with your PCP.     ED Prescriptions    Medication Sig Dispense Auth. Provider   acetaminophen (TYLENOL) 500 MG tablet Take 1 tablet (500 mg total) by mouth every 6 (six) hours as needed. 30 tablet ,  B, NP   mometasone (NASONEX) 50 MCG/ACT nasal spray Place 2 sprays into the nose daily. 17 g Linus Mako B, NP   dextromethorphan-guaiFENesin (MUCINEX DM) 30-600 MG 12hr tablet Take 2 tablets by mouth 2 (two) times daily for 5 days.  20 tablet Georgetta Haber, NP     Controlled Substance Prescriptions [place] Controlled Substance Registry consulted? Not Applicable   Georgetta Haber, NP 07/21/18 1158

## 2018-07-22 LAB — CERVICOVAGINAL ANCILLARY ONLY
Bacterial vaginitis: POSITIVE — AB
CHLAMYDIA, DNA PROBE: NEGATIVE
Candida vaginitis: POSITIVE — AB
NEISSERIA GONORRHEA: NEGATIVE
Trichomonas: NEGATIVE

## 2018-07-24 ENCOUNTER — Telehealth (HOSPITAL_COMMUNITY): Payer: Self-pay

## 2018-07-24 MED ORDER — FLUCONAZOLE 150 MG PO TABS: 150.0000 mg | ORAL_TABLET | Freq: Every day | ORAL | 0 refills | Status: AC

## 2018-07-24 MED ORDER — METRONIDAZOLE 500 MG PO TABS: 500.0000 mg | ORAL_TABLET | Freq: Two times a day (BID) | ORAL | 0 refills | Status: DC

## 2018-07-24 NOTE — Telephone Encounter (Signed)
Bacterial vaginosis is positive. This was not treated at the urgent care visit.  Flagyl 500 mg BID x 7 days #14 no refills sent to patients pharmacy of choice.    Test for candida (yeast) was positive.  Prescription for fluconazole 150mg  po now, repeat dose in 3d if needed, #2 no refills, sent to the pharmacy of record.  Recheck or followup with PCP for further evaluation if symptoms are not improving.    Patient contacted, denies any concerns. Answered all questions.

## 2018-08-01 ENCOUNTER — Ambulatory Visit (HOSPITAL_COMMUNITY)
Admission: EM | Admit: 2018-08-01 | Discharge: 2018-08-01 | Disposition: A | Discharge status: Home / Self Care | Payer: 59 | Attending: Family Medicine | Admitting: Family Medicine

## 2018-08-01 ENCOUNTER — Encounter (HOSPITAL_COMMUNITY): Payer: Self-pay

## 2018-08-01 DIAGNOSIS — F419 Anxiety disorder, unspecified: Secondary | ICD-10-CM | POA: Diagnosis not present

## 2018-08-01 DIAGNOSIS — R030 Elevated blood-pressure reading, without diagnosis of hypertension: Secondary | ICD-10-CM | POA: Insufficient documentation

## 2018-08-01 DIAGNOSIS — J0110 Acute frontal sinusitis, unspecified: Secondary | ICD-10-CM | POA: Diagnosis not present

## 2018-08-01 DIAGNOSIS — R05 Cough: Secondary | ICD-10-CM | POA: Diagnosis present

## 2018-08-01 DIAGNOSIS — Z79899 Other long term (current) drug therapy: Secondary | ICD-10-CM | POA: Insufficient documentation

## 2018-08-01 DIAGNOSIS — Z7722 Contact with and (suspected) exposure to environmental tobacco smoke (acute) (chronic): Secondary | ICD-10-CM | POA: Diagnosis not present

## 2018-08-01 DIAGNOSIS — Z7951 Long term (current) use of inhaled steroids: Secondary | ICD-10-CM | POA: Diagnosis not present

## 2018-08-01 LAB — POCT RAPID STREP A: STREPTOCOCCUS, GROUP A SCREEN (DIRECT): NEGATIVE

## 2018-08-01 MED ORDER — AMOXICILLIN-POT CLAVULANATE 875-125 MG PO TABS: 1.0000 | ORAL_TABLET | Freq: Two times a day (BID) | ORAL | 0 refills | Status: AC

## 2018-08-01 NOTE — Discharge Instructions (Signed)
Declines chest x-ray Rapid strep was negative.  We will send out to culture and notify you of the results.  Get plenty of rest and push fluids Augmentin prescribed to treat for potential sinus infection Take antibiotic as directed and to completion Follow up with PCP for recheck and/or if symptoms persists Return or go to ER if you have any new or worsening symptoms such as fever, chills, fatigue, shortness of breath, wheezing, chest pain, nausea, changes in bowel or bladder habits, etc...Marland Kitchen

## 2018-08-01 NOTE — ED Triage Notes (Signed)
Pt presents with complaints of worsening sore throat and cough since she was seen here on 11/11

## 2018-08-01 NOTE — ED Provider Notes (Signed)
[facility]   [id number] 08/01/18 Arrival Time: 1410  CC: COUGH  SUBJECTIVE:  Susan Branch is a 22 y.o. female who presents sinus HA, and worsening cough and sore throat x 2-3 weeks.  States she was initially getting better, and has had worsening symptoms within the last few days. Seen at [facility] on 11/11 and diagnosed with viral uri with cough.  Treated with nasal spray, cough medicine and tylenol.  Admits to positive sick exposure to relatives with similar symptoms.  Describes cough as intermittent and nonproductive.  Has tried prescribed medications with temporary relief.  Symptoms are made worse at night.   Denies fever, chills, fatigue, SOB, wheezing, chest pain, nausea, changes in bowel or bladder habits.    ROS: As per HPI.  Past Medical History:  Diagnosis Date  . Allergy   . Anxiety   . Borderline hypertension    no current med.  Marland Kitchen. Headache   . Tonsillar and adenoid hypertrophy 05/2013   snores during sleep, mother denies apnea   Past Surgical History:  Procedure Laterality Date  . TONSILLECTOMY AND ADENOIDECTOMY N/A 06/02/2013   Procedure: TONSILLECTOMY AND ADENOIDECTOMY;  Surgeon: Darletta MollSui W Teoh, MD;  Location: [facility];  Service: ENT;  Laterality: N/A;  . TURBINATE REDUCTION Bilateral 06/02/2013   Procedure: TURBINATE REDUCTION BILATERAL;  Surgeon: Darletta MollSui W Teoh, MD;  Location: [facility];  Service: ENT;  Laterality: Bilateral;   Allergies  Allergen Reactions  . Adhesive [Tape] Rash  . Latex Rash  . Soap Rash   No current facility-administered medications on file prior to encounter.    Current Outpatient Medications on File Prior to Encounter  Medication Sig Dispense Refill  . acetaminophen (TYLENOL) 500 MG tablet Take 1 tablet (500 mg total) by mouth every 6 (six) hours as needed. 30 tablet 0  . eletriptan (RELPAX) 40 MG tablet One tablet by mouth at onset of headache. May repeat in 2 hours if headache persists or recurs. 10 tablet  2  . metroNIDAZOLE (FLAGYL) 500 MG tablet Take 1 tablet (500 mg total) by mouth 2 (two) times daily. 14 tablet 0  . mometasone (NASONEX) 50 MCG/ACT nasal spray Place 2 sprays into the nose daily. 17 g 12  . propranolol ER (INDERAL LA) 60 MG 24 hr capsule Take 1 capsule (60 mg total) by mouth daily. For migraines 30 capsule 3    Social History   Socioeconomic History  . Marital status: Single    Spouse name: Not on file  . Number of children: Not on file  . Years of education: Not on file  . Highest education level: Not on file  Occupational History  . Not on file  Social Needs  . Financial resource strain: Not on file  . Food insecurity:    Worry: Not on file    Inability: Not on file  . Transportation needs:    Medical: Not on file    Non-medical: Not on file  Tobacco Use  . Smoking status: Passive Smoke Exposure - Never Smoker  . Smokeless tobacco: Never Used  . Tobacco comment: father smokes outside  Substance and Sexual Activity  . Alcohol use: No  . Drug use: Yes    Types: Marijuana  . Sexual activity: Yes    Birth control/protection: None  Lifestyle  . Physical activity:    Days per week: Not on file    Minutes per session: Not on file  . Stress: Not on file  Relationships  .  Social connections:    Talks on phone: Not on file    Gets together: Not on file    Attends religious service: Not on file    Active member of club or organization: Not on file    Attends meetings of clubs or organizations: Not on file    Relationship status: Not on file  . Intimate partner violence:    Fear of current or ex partner: Not on file    Emotionally abused: Not on file    Physically abused: Not on file    Forced sexual activity: Not on file  Other Topics Concern  . Not on file  Social History Narrative  . Not on file   Family History  Problem Relation Age of Onset  . Diabetes Mother   . Diabetes Father   . Hypertension Father   . Heart disease Father        MI  .  Hyperlipidemia Father   . Kidney disease Father   . Vision loss Father   . Vision loss Maternal Grandmother      OBJECTIVE:  Vitals:   08/01/18 1444  BP: (!) 147/109  Pulse: 76  Resp: 16  Temp: 98.5 F (36.9 C)  TempSrc: Oral  SpO2: 98%     General appearance: Alert, appears fatigued, but nontoxic; speaking in full sentences without difficulty HEENT:NCAT; Ears: EACs clear, TMs pearly gray; Eyes: PERRL.  EOM grossly intact. Nose: nares patent with mild clear rhinorrhea; Sinus: Mild frontal and maxillary sinus tenderness; Throat: tonsils mildly erythematous, not enlarged, uvula midline  Neck: supple without LAD Lungs: clear to auscultation bilaterally without adventitious breath sounds; normal respiratory effort; no cough present Heart: regular rate and rhythm.  Radial pulses 2+ symmetrical bilaterally Skin: warm and dry Psychological: alert and cooperative; normal mood and affect  ASSESSMENT & PLAN:  1. Acute non-recurrent frontal sinusitis    Meds ordered this encounter  Medications  . amoxicillin-clavulanate (AUGMENTIN) 875-125 MG tablet    Sig: Take 1 tablet by mouth every 12 (twelve) hours for 10 days.    Dispense:  20 tablet    Refill:  0    Order Specific Question:   Supervising Provider    Answer:   Isa Rankin [[id number]]   Orders Placed This Encounter  Procedures  . Culture, group A strep (throat)    Standing Status:   Standing    Number of Occurrences:   1  . POCT rapid strep A [facility])    Standing Status:   Standing    Number of Occurrences:   1    Declines chest x-ray Rapid strep was negative.  We will send out to culture and notify you of the results.  Get plenty of rest and push fluids Augmentin prescribed to treat for potential sinus infection Take antibiotic as directed and to completion Follow up with PCP for recheck and/or if symptoms persists Return or go to ER if you have any new or worsening symptoms such as fever, chills,  fatigue, shortness of breath, wheezing, chest pain, nausea, changes in bowel or bladder habits, etc...  Reviewed expectations re: course of current medical issues. Questions answered. Outlined signs and symptoms indicating need for more acute intervention. Patient verbalized understanding. After Visit Summary given.          Rennis Harding, PA-C 08/01/18 1520

## 2018-08-04 LAB — CULTURE, GROUP A STREP (THRC)

## 2018-10-01 ENCOUNTER — Ambulatory Visit (INDEPENDENT_AMBULATORY_CARE_PROVIDER_SITE_OTHER): Payer: 59 | Admitting: Family Medicine

## 2018-10-01 ENCOUNTER — Encounter: Payer: Self-pay | Admitting: Family Medicine

## 2018-10-01 VITALS — BP 110/68 | HR 82 | Temp 98.5°F | Resp 14 | Ht 64.0 in | Wt 237.0 lb

## 2018-10-01 DIAGNOSIS — F411 Generalized anxiety disorder: Secondary | ICD-10-CM | POA: Diagnosis not present

## 2018-10-01 DIAGNOSIS — Z3046 Encounter for surveillance of implantable subdermal contraceptive: Secondary | ICD-10-CM | POA: Diagnosis not present

## 2018-10-01 DIAGNOSIS — R002 Palpitations: Secondary | ICD-10-CM

## 2018-10-01 DIAGNOSIS — Z113 Encounter for screening for infections with a predominantly sexual mode of transmission: Secondary | ICD-10-CM

## 2018-10-01 DIAGNOSIS — N898 Other specified noninflammatory disorders of vagina: Secondary | ICD-10-CM | POA: Diagnosis not present

## 2018-10-01 DIAGNOSIS — R7303 Prediabetes: Secondary | ICD-10-CM

## 2018-10-01 DIAGNOSIS — Z6841 Body Mass Index (BMI) 40.0 and over, adult: Secondary | ICD-10-CM

## 2018-10-01 DIAGNOSIS — R8290 Unspecified abnormal findings in urine: Secondary | ICD-10-CM

## 2018-10-01 DIAGNOSIS — G43009 Migraine without aura, not intractable, without status migrainosus: Secondary | ICD-10-CM

## 2018-10-01 LAB — PREGNANCY, URINE: PREG TEST UR: NEGATIVE

## 2018-10-01 LAB — URINALYSIS, ROUTINE W REFLEX MICROSCOPIC
Bilirubin Urine: NEGATIVE
Glucose, UA: NEGATIVE
HGB URINE DIPSTICK: NEGATIVE
Ketones, ur: NEGATIVE
Nitrite: NEGATIVE
PROTEIN: NEGATIVE
RBC / HPF: NONE SEEN /HPF (ref 0–2)
Specific Gravity, Urine: 1.02 (ref 1.001–1.03)
pH: 6.5 (ref 5.0–8.0)

## 2018-10-01 LAB — WET PREP FOR TRICH, YEAST, CLUE

## 2018-10-01 LAB — MICROSCOPIC MESSAGE

## 2018-10-01 MED ORDER — FLUOXETINE HCL 10 MG PO TABS: 10.0000 mg | ORAL_TABLET | Freq: Every day | ORAL | 3 refills | Status: DC

## 2018-10-01 NOTE — Assessment & Plan Note (Signed)
Start prozac 10mg  once a day for anxiety  Discussed psychotherapy, pt willing to try

## 2018-10-01 NOTE — Assessment & Plan Note (Signed)
Continue propranolol relpax prn

## 2018-10-01 NOTE — Patient Instructions (Signed)
Referral for therapy  Start prozac 10mg  once a day  We will call with lab results F/U 1 month

## 2018-10-01 NOTE — Assessment & Plan Note (Signed)
upreg neg, spotting due to hormone changes with nexplanon, STD screen with vaginal discharge UA fairly unremarkable , await culture before treatment Discussed use of condoms

## 2018-10-01 NOTE — Progress Notes (Signed)
Subjective:    Patient ID: Susan Branch, female    DOB: 10/17/1995, 23 y.o.   MRN: [id number]  Patient presents for Discuss BCP; Stress - increased HR; and Headache (Memory issues)  Having spotting with Nexplanon, pat few months and vaginal discharge.  this placed in September. She was treated for yeast infection in November.   Sexually active - 1 partner     Has odor to urine , but not burning with urination or abdominal pain  Verne SpurrStress-she has had increased stress.  She would like to go back to college but is fearful of filling out the forms again because she missed deadlines last time.  She also does not work she sits around at home most of the day or goes out with her cousin.  Her parents are pain for everything.  She has no particular schedule.  She says she has had episodes where her heart rate starts beating fast and she cannot catch her breath and she just feels jittery all over all the time.  Her father is having significant difficulties with his mood he has not been taking his medications he has been a lot of stress household.  She is not talking with her younger sister states that she has an attitude all the time.  Not have any difficulties with her sleep or her appetite.  Migraines- taking propramolol, taking replax,   Borderine diabetes - last A1C 5.8%   Review Of Systems:  GEN- denies fatigue, fever, weight loss,weakness, recent illness HEENT- denies eye drainage, change in vision, nasal discharge, CVS- denies chest pain, palpitations RESP- denies SOB, cough, wheeze ABD- denies N/V, change in stools, abd pain GU- denies dysuria, hematuria, dribbling, incontinence MSK- denies joint pain, muscle aches, injury Neuro- denies headache, dizziness, syncope, seizure activity       Objective:    BP 110/68   Pulse 82   Temp 98.5 F (36.9 C) (Oral)   Resp 14   Ht 5\' 4"  (1.626 m)   Wt 237 lb (107.5 kg)   SpO2 98%   BMI 40.68 kg/m  GEN- NAD, alert and oriented x3 HEENT-  PERRL, EOMI, non injected sclera, pink conjunctiva, MMM, oropharynx clear Neck- Supple, no thyromegaly CVS- RRR, no murmur RESP-CTAB ABD-NABS,soft,NT,ND, no CVA tenderness GU- normal external genitalia, vaginal mucosa pink and moist, cervix visualized no growth, mild blood form os, minimal thin clear discharge, no CMT, no ovarian masses, uterus normal size Psych- normal affect and mood EXT- No edema Pulses- Radial, DP- 2+  EKG- NSR, no ST changes        Assessment & Plan:      Problem List Items Addressed This Visit      Unprioritized   Borderline diabetes   Relevant Orders   Hemoglobin A1c   Contraceptive management    upreg neg, spotting due to hormone changes with nexplanon, STD screen with vaginal discharge UA fairly unremarkable , await culture before treatment Discussed use of condoms      Relevant Orders   Pregnancy, urine (Completed)   GAD (generalized anxiety disorder)    Start prozac 10mg  once a day for anxiety  Discussed psychotherapy, pt willing to try       Relevant Medications   FLUoxetine (PROZAC) 10 MG tablet   Migraine headache - Primary    Continue propranolol relpax prn      Relevant Medications   FLUoxetine (PROZAC) 10 MG tablet   Obesity    Other Visit Diagnoses    Palpitations  Relevant Orders   TSH   CBC with Differential/Platelet   Comprehensive metabolic panel   EKG 12-Lead (Completed)   Vaginal discharge       Relevant Orders   WET PREP FOR TRICH, YEAST, CLUE (Completed)   C. trachomatis/N. gonorrhoeae RNA   Screen for STD (sexually transmitted disease)       Relevant Orders   HIV Antibody (routine testing w rflx)   Abnormal urine odor       Relevant Orders   Urinalysis, Routine w reflex microscopic (Completed)   Urine Culture      Note: This dictation was prepared with Dragon dictation along with smaller phrase technology. Any transcriptional errors that result from this process are unintentional.

## 2018-10-02 LAB — CBC WITH DIFFERENTIAL/PLATELET
Absolute Monocytes: 546 cells/uL (ref 200–950)
BASOS PCT: 0.6 %
Basophils Absolute: 62 cells/uL (ref 0–200)
EOS PCT: 2 %
Eosinophils Absolute: 206 cells/uL (ref 15–500)
HEMATOCRIT: 40.8 % (ref 35.0–45.0)
HEMOGLOBIN: 13.8 g/dL (ref 11.7–15.5)
Lymphs Abs: 2843 cells/uL (ref 850–3900)
MCH: 29.1 pg (ref 27.0–33.0)
MCHC: 33.8 g/dL (ref 32.0–36.0)
MCV: 86.1 fL (ref 80.0–100.0)
MPV: 11.2 fL (ref 7.5–12.5)
Monocytes Relative: 5.3 %
NEUTROS ABS: 6644 {cells}/uL (ref 1500–7800)
Neutrophils Relative %: 64.5 %
Platelets: 455 10*3/uL — ABNORMAL HIGH (ref 140–400)
RBC: 4.74 10*6/uL (ref 3.80–5.10)
RDW: 12.3 % (ref 11.0–15.0)
Total Lymphocyte: 27.6 %
WBC: 10.3 10*3/uL (ref 3.8–10.8)

## 2018-10-02 LAB — COMPREHENSIVE METABOLIC PANEL
AG Ratio: 1.4 (calc) (ref 1.0–2.5)
ALBUMIN MSPROF: 4 g/dL (ref 3.6–5.1)
ALKALINE PHOSPHATASE (APISO): 60 U/L (ref 33–115)
ALT: 13 U/L (ref 6–29)
AST: 12 U/L (ref 10–30)
BUN: 13 mg/dL (ref 7–25)
CHLORIDE: 107 mmol/L (ref 98–110)
CO2: 25 mmol/L (ref 20–32)
CREATININE: 0.93 mg/dL (ref 0.50–1.10)
Calcium: 9.7 mg/dL (ref 8.6–10.2)
GLOBULIN: 2.8 g/dL (ref 1.9–3.7)
Glucose, Bld: 101 mg/dL — ABNORMAL HIGH (ref 65–99)
POTASSIUM: 4.4 mmol/L (ref 3.5–5.3)
Sodium: 142 mmol/L (ref 135–146)
Total Bilirubin: 0.2 mg/dL (ref 0.2–1.2)
Total Protein: 6.8 g/dL (ref 6.1–8.1)

## 2018-10-02 LAB — C. TRACHOMATIS/N. GONORRHOEAE RNA
C. trachomatis RNA, TMA: NOT DETECTED
N. GONORRHOEAE RNA, TMA: NOT DETECTED

## 2018-10-02 LAB — HEMOGLOBIN A1C
EAG (MMOL/L): 6.3 (calc)
Hgb A1c MFr Bld: 5.6 % of total Hgb (ref <5.7)
Mean Plasma Glucose: 114 (calc)

## 2018-10-02 LAB — TSH: TSH: 0.74 mIU/L

## 2018-10-02 LAB — HIV ANTIBODY (ROUTINE TESTING W REFLEX): HIV 1&2 Ab, 4th Generation: NONREACTIVE

## 2018-10-04 LAB — URINE CULTURE
MICRO NUMBER:: 89409
SPECIMEN QUALITY:: ADEQUATE

## 2018-10-05 MED ORDER — FLUCONAZOLE 150 MG PO TABS: 150.0000 mg | ORAL_TABLET | Freq: Once | ORAL | 0 refills | Status: AC

## 2018-10-05 MED ORDER — NITROFURANTOIN MONOHYD MACRO 100 MG PO CAPS: 100.0000 mg | ORAL_CAPSULE | Freq: Two times a day (BID) | ORAL | 0 refills | Status: DC

## 2018-10-05 NOTE — Addendum Note (Signed)
Addended by: [place], Kairon Shock F on: 10/05/2018 11:29 AM   Modules accepted: Orders  

## 2018-10-05 NOTE — Addendum Note (Signed)
Addended by: Milinda Antis F on: 10/05/2018 11:29 AM   Modules accepted: Orders

## 2018-10-06 ENCOUNTER — Other Ambulatory Visit: Payer: Self-pay | Admitting: *Deleted

## 2018-10-06 MED ORDER — FLUCONAZOLE 150 MG PO TABS: 150.0000 mg | ORAL_TABLET | Freq: Once | ORAL | 0 refills | Status: AC

## 2018-10-31 ENCOUNTER — Ambulatory Visit: Payer: 59 | Admitting: Family Medicine

## 2018-11-17 ENCOUNTER — Encounter (HOSPITAL_COMMUNITY): Payer: Self-pay | Admitting: Emergency Medicine

## 2018-11-17 ENCOUNTER — Ambulatory Visit (HOSPITAL_COMMUNITY)
Admission: EM | Admit: 2018-11-17 | Discharge: 2018-11-17 | Disposition: A | Discharge status: Home / Self Care | Payer: 59 | Attending: Internal Medicine | Admitting: Internal Medicine

## 2018-11-17 DIAGNOSIS — J029 Acute pharyngitis, unspecified: Secondary | ICD-10-CM | POA: Diagnosis not present

## 2018-11-17 DIAGNOSIS — G501 Atypical facial pain: Secondary | ICD-10-CM | POA: Diagnosis not present

## 2018-11-17 DIAGNOSIS — B349 Viral infection, unspecified: Secondary | ICD-10-CM | POA: Diagnosis not present

## 2018-11-17 DIAGNOSIS — R05 Cough: Secondary | ICD-10-CM | POA: Diagnosis not present

## 2018-11-17 DIAGNOSIS — J3489 Other specified disorders of nose and nasal sinuses: Secondary | ICD-10-CM

## 2018-11-17 DIAGNOSIS — R51 Headache: Secondary | ICD-10-CM | POA: Diagnosis not present

## 2018-11-17 MED ORDER — KETOROLAC TROMETHAMINE 30 MG/ML IJ SOLN
30.0000 mg | Freq: Once | INTRAMUSCULAR | Status: AC
  Administered 2018-11-17: 30 mg via INTRAMUSCULAR

## 2018-11-17 MED ORDER — IPRATROPIUM BROMIDE 0.06 % NA SOLN: 2.0000 | Freq: Four times a day (QID) | NASAL | 0 refills | Status: DC

## 2018-11-17 MED ORDER — FLUTICASONE PROPIONATE 50 MCG/ACT NA SUSP: 2.0000 | Freq: Every day | NASAL | 0 refills | Status: DC

## 2018-11-17 MED ORDER — KETOROLAC TROMETHAMINE 30 MG/ML IJ SOLN
INTRAMUSCULAR | Status: AC
  Filled 2018-11-17: qty 1

## 2018-11-17 NOTE — Discharge Instructions (Signed)
Toradol injection in office today for headache. Start flonase, atrovent nasal spray for nasal congestion/drainage. You can use over the counter nasal saline rinse such as neti pot for nasal congestion. Keep hydrated, your urine should be clear to pale yellow in color. Tylenol/motrin for fever and pain. Monitor for any worsening of symptoms, chest pain, shortness of breath, wheezing, swelling of the throat, follow up for reevaluation.   For sore throat/cough try using a honey-based tea. Use 3 teaspoons of honey with juice squeezed from half lemon. Place shaved pieces of ginger into 1/2-1 cup of water and warm over stove top. Then mix the ingredients and repeat every 4 hours as needed.

## 2018-11-17 NOTE — ED Triage Notes (Signed)
Pt sts URI and sore throat  

## 2018-11-17 NOTE — ED Provider Notes (Signed)
[facility]    CSN: [id number] Arrival date & time: 11/17/18  1929     History   Chief Complaint Chief Complaint  Patient presents with  . Sore Throat    HPI Susan Branch is a 23 y.o. female.   23 year old female comes in for 2 to 3-day history of URI symptoms.  Has had sore throat, cough, sinus pressure.  Denies rhinorrhea, nasal congestion.  Denies fever, chills, night sweats.  Has also noticed right facial pain that has been intermittent.  No facial swelling, dental pain.  Denies swelling of the throat, trouble swallowing, tripoding, drooling, trismus.  Took aspirin and lozenges without relief.  No obvious sick contact.  History of tonsillectomy.     Past Medical History:  Diagnosis Date  . Allergy   . Anxiety   . Borderline hypertension    no current med.  Marland Kitchen Headache   . Tonsillar and adenoid hypertrophy 05/2013   snores during sleep, mother denies apnea    Patient Active Problem List   Diagnosis Date Noted  . GAD (generalized anxiety disorder) 10/01/2018  . Borderline diabetes 04/18/2018  . Depression, major, single episode, moderate (HCC) 09/25/2017  . Contraceptive management 06/20/2016  . Constipation 06/16/2014  . Obesity 04/22/2014  . Acne vulgaris 04/22/2014  . Migraine headache 04/22/2014  . Routine infant or child health check 04/22/2014  . Irregular menses 04/22/2014    Past Surgical History:  Procedure Laterality Date  . TONSILLECTOMY AND ADENOIDECTOMY N/A 06/02/2013   Procedure: TONSILLECTOMY AND ADENOIDECTOMY;  Surgeon: [name], MD;  Location: [facility];  Service: ENT;  Laterality: N/A;  . TURBINATE REDUCTION Bilateral 06/02/2013   Procedure: TURBINATE REDUCTION BILATERAL;  Surgeon: [name], MD;  Location: [facility];  Service: ENT;  Laterality: Bilateral;    OB History    Gravida  0   Para  0   Term  0   Preterm  0   AB  0   Living  0     SAB  0   TAB  0   Ectopic  0   Multiple   0   Live Births  0            Home Medications    Prior to Admission medications   Medication Sig Start Date End Date Taking? Authorizing Provider  acetaminophen (TYLENOL) 500 MG tablet Take 1 tablet (500 mg total) by mouth every 6 (six) hours as needed. 07/21/18   Georgetta Haber, NP  eletriptan (RELPAX) 40 MG tablet One tablet by mouth at onset of headache. May repeat in 2 hours if headache persists or recurs. 09/25/17   Salley Scarlet, MD  FLUoxetine (PROZAC) 10 MG tablet Take 1 tablet (10 mg total) by mouth daily. 10/01/18   Salley Scarlet, MD  fluticasone [facility]) 50 MCG/ACT nasal spray Place 2 sprays into both nostrils daily. 11/17/18   Cathie Hoops, Eppie Barhorst V, PA-C  ipratropium (ATROVENT) 0.06 % nasal spray Place 2 sprays into both nostrils 4 (four) times daily. 11/17/18   Cathie Hoops, Jennessa Trigo V, PA-C  mometasone (NASONEX) 50 MCG/ACT nasal spray Place 2 sprays into the nose daily. 07/21/18   Georgetta Haber, NP  nitrofurantoin, macrocrystal-monohydrate, (MACROBID) 100 MG capsule Take 1 capsule (100 mg total) by mouth 2 (two) times daily. 10/05/18   Salley Scarlet, MD  propranolol ER (INDERAL LA) 60 MG 24 hr capsule Take 1 capsule (60 mg total) by mouth daily. For migraines  04/18/18   Salley Scarlet, MD    Family History Family History  Problem Relation Age of Onset  . Diabetes Mother   . Diabetes Father   . Hypertension Father   . Heart disease Father        MI  . Hyperlipidemia Father   . Kidney disease Father   . Vision loss Father   . Vision loss Maternal Grandmother     Social History Social History   Tobacco Use  . Smoking status: Passive Smoke Exposure - Never Smoker  . Smokeless tobacco: Never Used  . Tobacco comment: father smokes outside  Substance Use Topics  . Alcohol use: No  . Drug use: Yes    Types: Marijuana     Allergies   Adhesive [tape]; Latex; and Soap   Review of Systems Review of Systems  Reason unable to perform ROS: See HPI as above.      Physical Exam Triage Vital Signs ED Triage Vitals  Enc Vitals Group     BP 11/17/18 2006 138/85     Pulse Rate 11/17/18 2006 94     Resp 11/17/18 2006 18     Temp 11/17/18 2006 98.6 F (37 C)     Temp Source 11/17/18 2006 Oral     SpO2 11/17/18 2006 98 %     Weight --      Height --      Head Circumference --      Peak Flow --      Pain Score 11/17/18 2007 4     Pain Loc --      Pain Edu? --      Excl. in GC? --    No data found.  Updated Vital Signs BP 138/85 (BP Location: Left Arm)   Pulse 94   Temp 98.6 F (37 C) (Oral)   Resp 18   SpO2 98%   Physical Exam Constitutional:      General: She is not in acute distress.    Appearance: She is well-developed. She is not ill-appearing, toxic-appearing or diaphoretic.  HENT:     Head: Normocephalic and atraumatic.     Right Ear: Tympanic membrane, ear canal and external ear normal. Tympanic membrane is not erythematous or bulging.     Left Ear: Tympanic membrane, ear canal and external ear normal. Tympanic membrane is not erythematous or bulging.     Nose:     Right Sinus: Maxillary sinus tenderness and frontal sinus tenderness present.     Left Sinus: Maxillary sinus tenderness and frontal sinus tenderness present.     Mouth/Throat:     Mouth: Mucous membranes are moist.     Pharynx: Oropharynx is clear. Uvula midline.     Comments: Patient with tenderness to the TMJ with jaw movement.  No crepitus felt.  No obvious dental caries/abnormality seen.  No tenderness to palpation of tooth/gum.  Floor of mouth soft to palpation.  No facial swelling. Eyes:     Conjunctiva/sclera: Conjunctivae normal.     Pupils: Pupils are equal, round, and reactive to light.  Neck:     Musculoskeletal: Normal range of motion and neck supple.  Cardiovascular:     Rate and Rhythm: Normal rate and regular rhythm.     Heart sounds: Normal heart sounds. No murmur. No friction rub. No gallop.   Pulmonary:     Effort: Pulmonary effort is  normal. No accessory muscle usage, prolonged expiration, respiratory distress or retractions.     Breath  sounds: Normal breath sounds. No stridor, decreased air movement or transmitted upper airway sounds. No decreased breath sounds, wheezing, rhonchi or rales.  Skin:    General: Skin is warm and dry.  Neurological:     Mental Status: She is alert and oriented to person, place, and time.     UC Treatments / Results  Labs (all labs ordered are listed, but only abnormal results are displayed) Labs Reviewed - No data to display  EKG None  Radiology No results found.  Procedures Procedures (including critical care time)  Medications Ordered in UC Medications  ketorolac (TORADOL) 30 MG/ML injection 30 mg (has no administration in time range)    Initial Impression / Assessment and Plan / UC Course  I have reviewed the triage vital signs and the nursing notes.  Pertinent labs & imaging results that were available during my care of the patient were reviewed by me and considered in my medical decision making (see chart for details).    Discussed with patient history and exam most consistent with viral URI. Symptomatic treatment as needed. Push fluids. Return precautions given.   Patient requesting pain medication for headache.  States has history of migraines, was wondering if she could get injection to help abort headache prior to returning to her migraine.  Has not taken any ibuprofen/naproxen in the last 8 hours.  Will provide Toradol injection in office today.  Final Clinical Impressions(s) / UC Diagnoses   Final diagnoses:  Viral illness   ED Prescriptions    Medication Sig Dispense Auth. Provider   fluticasone (FLONASE) 50 MCG/ACT nasal spray Place 2 sprays into both nostrils daily. 1 g Channon Brougher V, PA-C   ipratropium (ATROVENT) 0.06 % nasal spray Place 2 sprays into both nostrils 4 (four) times daily. 15 mL Threasa Alpha, [place] 11/17/18 2020

## 2018-11-18 ENCOUNTER — Ambulatory Visit: Payer: 59 | Admitting: Family Medicine

## 2018-12-08 ENCOUNTER — Ambulatory Visit: Payer: 59 | Admitting: Family Medicine

## 2019-02-23 ENCOUNTER — Other Ambulatory Visit: Payer: Self-pay

## 2019-02-23 ENCOUNTER — Ambulatory Visit
Admission: EM | Admit: 2019-02-23 | Discharge: 2019-02-23 | Disposition: A | Discharge status: Home / Self Care | Payer: 59 | Attending: Emergency Medicine | Admitting: Emergency Medicine

## 2019-02-23 DIAGNOSIS — S70311A Abrasion, right thigh, initial encounter: Secondary | ICD-10-CM

## 2019-02-23 MED ORDER — MUPIROCIN CALCIUM 2 % EX CREA: 1.0000 "application " | TOPICAL_CREAM | Freq: Two times a day (BID) | CUTANEOUS | 0 refills | Status: DC

## 2019-02-23 NOTE — ED Provider Notes (Signed)
[place]   [id number] 02/23/19 Arrival Time: 1519  CC: Wound   SUBJECTIVE:  Susan Branch is a 23 y.o. female hx significant for allergies, anxiety, borderline HTN, HA, and tonsillar hypertrophy, who presents with a wound to left inner thigh that occurred 4 days ago.  Denies precipitating event or trauma.  Patient speculates symptoms began after thighs were rubbing together.  Has tried peroxide and alcohol without relief.  Symptoms are made worse with friction and hot showers.  Denies similar symptoms in the past.   Denies fever, chills, nausea, vomiting, swelling, discharge, SOB, chest pain, abdominal pain, changes in bowel or bladder function.    ROS: As per HPI.  Past Medical History:  Diagnosis Date  . Allergy   . Anxiety   . Borderline hypertension    no current med.  Marland Kitchen Headache   . Tonsillar and adenoid hypertrophy 05/2013   snores during sleep, mother denies apnea   Past Surgical History:  Procedure Laterality Date  . TONSILLECTOMY AND ADENOIDECTOMY N/A 06/02/2013   Procedure: TONSILLECTOMY AND ADENOIDECTOMY;  Surgeon: Ascencion Dike, MD;  Location: [facility];  Service: ENT;  Laterality: N/A;  . TURBINATE REDUCTION Bilateral 06/02/2013   Procedure: TURBINATE REDUCTION BILATERAL;  Surgeon: Ascencion Dike, MD;  Location: [facility];  Service: ENT;  Laterality: Bilateral;   Allergies  Allergen Reactions  . Adhesive [Tape] Rash  . Latex Rash  . Soap Rash   No current facility-administered medications on file prior to encounter.    Current Outpatient Medications on File Prior to Encounter  Medication Sig Dispense Refill  . acetaminophen (TYLENOL) 500 MG tablet Take 1 tablet (500 mg total) by mouth every 6 (six) hours as needed. 30 tablet 0  . eletriptan (RELPAX) 40 MG tablet One tablet by mouth at onset of headache. May repeat in 2 hours if headache persists or recurs. 10 tablet 2  . FLUoxetine (PROZAC) 10 MG tablet Take 1 tablet (10 mg  total) by mouth daily. 30 tablet 3  . fluticasone (FLONASE) 50 MCG/ACT nasal spray Place 2 sprays into both nostrils daily. 1 g 0  . ipratropium (ATROVENT) 0.06 % nasal spray Place 2 sprays into both nostrils 4 (four) times daily. 15 mL 0  . mometasone (NASONEX) 50 MCG/ACT nasal spray Place 2 sprays into the nose daily. 17 g 12  . propranolol ER (INDERAL LA) 60 MG 24 hr capsule Take 1 capsule (60 mg total) by mouth daily. For migraines 30 capsule 3   Social History   Socioeconomic History  . Marital status: Single    Spouse name: Not on file  . Number of children: Not on file  . Years of education: Not on file  . Highest education level: Not on file  Occupational History  . Not on file  Social Needs  . Financial resource strain: Not on file  . Food insecurity    Worry: Not on file    Inability: Not on file  . Transportation needs    Medical: Not on file    Non-medical: Not on file  Tobacco Use  . Smoking status: Passive Smoke Exposure - Never Smoker  . Smokeless tobacco: Never Used  . Tobacco comment: father smokes outside  Substance and Sexual Activity  . Alcohol use: No  . Drug use: Yes    Types: Marijuana  . Sexual activity: Yes    Birth control/protection: None  Lifestyle  . Physical activity    Days per  week: Not on file    Minutes per session: Not on file  . Stress: Not on file  Relationships  . Social Musicianconnections    Talks on phone: Not on file    Gets together: Not on file    Attends religious service: Not on file    Active member of club or organization: Not on file    Attends meetings of clubs or organizations: Not on file    Relationship status: Not on file  . Intimate partner violence    Fear of current or ex partner: Not on file    Emotionally abused: Not on file    Physically abused: Not on file    Forced sexual activity: Not on file  Other Topics Concern  . Not on file  Social History Narrative  . Not on file   Family History  Problem Relation  Age of Onset  . Diabetes Mother   . Diabetes Father   . Hypertension Father   . Heart disease Father        MI  . Hyperlipidemia Father   . Kidney disease Father   . Vision loss Father   . Vision loss Maternal Grandmother     OBJECTIVE: Vitals:   02/23/19 1516  BP: 123/82  Pulse: 86  Resp: 20  Temp: 98.6 F (37 C)  SpO2: 98%    General appearance: alert; no distress Head: NCAT Lungs: clear to auscultation bilaterally Heart: regular rate and rhythm.  Radial pulse 2+ bilaterally Extremities: no edema Skin: warm and dry; small abrasion to right inner thigh, mildly erythematous, no active bleeding or discharge; approximately 2 cm in length; mildly TTP Psychological: alert and cooperative; normal mood and affect  ASSESSMENT & PLAN:  1. Abrasion, right thigh, initial encounter     Meds ordered this encounter  Medications  . mupirocin cream (BACTROBAN) 2 %    Sig: Apply 1 application topically 2 (two) times daily.    Dispense:  15 g    Refill:  0    Order Specific Question:   Supervising Provider    Answer:   Eustace MooreELSON, YVONNE SUE [[id number]]   Bandage applied Wash gently with warm water and mild soap.   Bactroban prescribed.  Use as instructed to help prevent infection Keep area covered to prevent friction Take OTC tylenol as needed for pain relief  Follow up here or with PCP if symptoms do not improve or worsening such as increased redness, swelling, drainage, pain, fever, chills, nausea, vomiting, etc...  Reviewed expectations re: course of current medical issues. Questions answered. Outlined signs and symptoms indicating need for more acute intervention. Patient verbalized understanding. After Visit Summary given.   Rennis HardingWurst, Willmar Stockinger, PA-C 02/23/19 1532

## 2019-02-23 NOTE — Discharge Instructions (Signed)
Bandage applied Wash gently with warm water and mild soap.   Bactroban prescribed.  Use as instructed to help prevent infection Keep area covered to prevent friction Take OTC tylenol as needed for pain relief  Follow up here or with PCP if symptoms do not improve or worsening such as increased redness, swelling, drainage, pain, fever, chills, nausea, vomiting, etc..Marland Kitchen

## 2019-02-23 NOTE — ED Triage Notes (Signed)
Pt has small abscess on right thigh .

## 2019-09-28 ENCOUNTER — Encounter: Payer: Self-pay | Admitting: Emergency Medicine

## 2019-09-28 ENCOUNTER — Ambulatory Visit
Admission: EM | Admit: 2019-09-28 | Discharge: 2019-09-28 | Disposition: A | Discharge status: Home / Self Care | Payer: 59 | Attending: Emergency Medicine | Admitting: Emergency Medicine

## 2019-09-28 ENCOUNTER — Other Ambulatory Visit: Payer: Self-pay

## 2019-09-28 DIAGNOSIS — Z20822 Contact with and (suspected) exposure to covid-19: Secondary | ICD-10-CM | POA: Diagnosis not present

## 2019-09-28 NOTE — Discharge Instructions (Signed)

## 2019-09-28 NOTE — ED Provider Notes (Signed)
[facility]   [id number] 09/28/19 Arrival Time: 1801   CC: COVID exposure  SUBJECTIVE: History from: patient.  Susan Branch is a 24 y.o. female who presents for COVID testing.  Possible COVID exposure 1 week ago.  Denies recent travel.  Denies aggravating or alleviating symptoms.  Denies previous COVID infection.   Denies fever, chills, fatigue, nasal congestion, rhinorrhea, sore throat, cough, SOB, wheezing, chest pain, nausea, vomiting, changes in bowel or bladder habits.    ROS: As per HPI.  All other pertinent ROS negative.     Past Medical History:  Diagnosis Date  . Allergy   . Anxiety   . Borderline hypertension    no current med.  Marland Kitchen Headache   . Tonsillar and adenoid hypertrophy 05/2013   snores during sleep, mother denies apnea   Past Surgical History:  Procedure Laterality Date  . TONSILLECTOMY AND ADENOIDECTOMY N/A 06/02/2013   Procedure: TONSILLECTOMY AND ADENOIDECTOMY;  Surgeon: [name], MD;  Location: [facility];  Service: ENT;  Laterality: N/A;  . TURBINATE REDUCTION Bilateral 06/02/2013   Procedure: TURBINATE REDUCTION BILATERAL;  Surgeon: [name], MD;  Location: [facility];  Service: ENT;  Laterality: Bilateral;   Allergies  Allergen Reactions  . Adhesive [Tape] Rash  . Latex Rash  . Soap Rash   No current facility-administered medications on file prior to encounter.   Current Outpatient Medications on File Prior to Encounter  Medication Sig Dispense Refill  . acetaminophen (TYLENOL) 500 MG tablet Take 1 tablet (500 mg total) by mouth every 6 (six) hours as needed. 30 tablet 0  . eletriptan (RELPAX) 40 MG tablet One tablet by mouth at onset of headache. May repeat in 2 hours if headache persists or recurs. 10 tablet 2  . FLUoxetine (PROZAC) 10 MG tablet Take 1 tablet (10 mg total) by mouth daily. 30 tablet 3  . fluticasone (FLONASE) 50 MCG/ACT nasal spray Place 2 sprays into both nostrils daily. 1 g 0  .  ipratropium (ATROVENT) 0.06 % nasal spray Place 2 sprays into both nostrils 4 (four) times daily. 15 mL 0  . mometasone (NASONEX) 50 MCG/ACT nasal spray Place 2 sprays into the nose daily. 17 g 12  . mupirocin cream (BACTROBAN) 2 % Apply 1 application topically 2 (two) times daily. 15 g 0  . propranolol ER (INDERAL LA) 60 MG 24 hr capsule Take 1 capsule (60 mg total) by mouth daily. For migraines 30 capsule 3   Social History   Socioeconomic History  . Marital status: Single    Spouse name: Not on file  . Number of children: Not on file  . Years of education: Not on file  . Highest education level: Not on file  Occupational History  . Not on file  Tobacco Use  . Smoking status: Passive Smoke Exposure - Never Smoker  . Smokeless tobacco: Never Used  . Tobacco comment: father smokes outside  Substance and Sexual Activity  . Alcohol use: No  . Drug use: Yes    Types: Marijuana  . Sexual activity: Yes    Birth control/protection: None  Other Topics Concern  . Not on file  Social History Narrative  . Not on file   Social Determinants of Health   Financial Resource Strain:   . Difficulty of Paying Living Expenses: Not on file  Food Insecurity:   . Worried About Programme researcher, broadcasting/film/video in the Last Year: Not on file  . Ran Out  of Food in the Last Year: Not on file  Transportation Needs:   . Lack of Transportation (Medical): Not on file  . Lack of Transportation (Non-Medical): Not on file  Physical Activity:   . Days of Exercise per Week: Not on file  . Minutes of Exercise per Session: Not on file  Stress:   . Feeling of Stress : Not on file  Social Connections:   . Frequency of Communication with Friends and Family: Not on file  . Frequency of Social Gatherings with Friends and Family: Not on file  . Attends Religious Services: Not on file  . Active Member of Clubs or Organizations: Not on file  . Attends Archivist Meetings: Not on file  . Marital Status: Not on file   Intimate Partner Violence:   . Fear of Current or Ex-Partner: Not on file  . Emotionally Abused: Not on file  . Physically Abused: Not on file  . Sexually Abused: Not on file   Family History  Problem Relation Age of Onset  . Diabetes Mother   . Diabetes Father   . Hypertension Father   . Heart disease Father        MI  . Hyperlipidemia Father   . Kidney disease Father   . Vision loss Father   . Vision loss Maternal Grandmother     OBJECTIVE:  Vitals:   09/28/19 1809  BP: 113/77  Pulse: 82  Resp: 17  Temp: 99.1 F (37.3 C)  TempSrc: Oral  SpO2: 97%     General appearance: alert; well-appearing, nontoxic; speaking in full sentences and tolerating own secretions HEENT: NCAT; Ears: EACs clear, TMs pearly gray; Eyes: PERRL.  EOM grossly intact.Nose: nares patent without rhinorrhea, Throat: oropharynx clear, tonsils non erythematous or enlarged, uvula midline  Neck: supple without LAD Lungs: unlabored respirations, symmetrical air entry; cough: absent; no respiratory distress; CTAB Heart: regular rate and rhythm.  Skin: warm and dry Psychological: alert and cooperative; normal mood and affect   ASSESSMENT & PLAN:  1. Exposure to COVID-19 virus    COVID testing ordered.  It will take between 5-7 days for test results.  Someone will contact you regarding abnormal results.    In the meantime: You should remain isolated in your home for 10 days from symptom onset AND greater than 72 hours after symptoms resolution (absence of fever without the use of fever-reducing medication and improvement in respiratory symptoms), whichever is longer OR 14 days from exposure Get plenty of rest and push fluids Use OTC zyrtec for nasal congestion, runny nose, and/or sore throat Use OTC flonase for nasal congestion and runny nose Use medications daily for symptom relief Use OTC medications like ibuprofen or tylenol as needed fever or pain Call or go to the ED if you have any new or  worsening symptoms such as fever, cough, shortness of breath, chest tightness, chest pain, turning blue, changes in mental status, etc...   Reviewed expectations re: course of current medical issues. Questions answered. Outlined signs and symptoms indicating need for more acute intervention. Patient verbalized understanding. After Visit Summary given.         Lestine Box, PA-C 09/28/19 1827

## 2019-09-28 NOTE — ED Triage Notes (Signed)
Pt presents to UC for covid test. Denies symptoms. States she had secondary positive covid exposure.

## 2019-09-29 LAB — NOVEL CORONAVIRUS, NAA: SARS-CoV-2, NAA: NOT DETECTED

## 2019-11-04 ENCOUNTER — Encounter: Payer: Self-pay | Admitting: Family Medicine

## 2019-11-04 ENCOUNTER — Other Ambulatory Visit: Payer: Self-pay

## 2019-11-04 ENCOUNTER — Ambulatory Visit: Payer: 59 | Admitting: Family Medicine

## 2019-11-04 ENCOUNTER — Ambulatory Visit (INDEPENDENT_AMBULATORY_CARE_PROVIDER_SITE_OTHER): Payer: 59 | Admitting: Family Medicine

## 2019-11-04 VITALS — BP 130/74 | HR 88 | Temp 98.4°F | Resp 16 | Ht 64.0 in | Wt 237.0 lb

## 2019-11-04 DIAGNOSIS — G43009 Migraine without aura, not intractable, without status migrainosus: Secondary | ICD-10-CM

## 2019-11-04 DIAGNOSIS — Z124 Encounter for screening for malignant neoplasm of cervix: Secondary | ICD-10-CM | POA: Diagnosis not present

## 2019-11-04 DIAGNOSIS — F411 Generalized anxiety disorder: Secondary | ICD-10-CM | POA: Diagnosis not present

## 2019-11-04 DIAGNOSIS — N926 Irregular menstruation, unspecified: Secondary | ICD-10-CM

## 2019-11-04 DIAGNOSIS — F321 Major depressive disorder, single episode, moderate: Secondary | ICD-10-CM | POA: Diagnosis not present

## 2019-11-04 LAB — WET PREP FOR TRICH, YEAST, CLUE

## 2019-11-04 LAB — PREGNANCY, URINE: Preg Test, Ur: NEGATIVE

## 2019-11-04 MED ORDER — PROPRANOLOL HCL ER 60 MG PO CP24: 60.0000 mg | ORAL_CAPSULE | Freq: Every day | ORAL | 6 refills | Status: DC

## 2019-11-04 MED ORDER — ELETRIPTAN HYDROBROMIDE 40 MG PO TABS: ORAL_TABLET | ORAL | 2 refills | Status: DC

## 2019-11-04 MED ORDER — NORGESTIMATE-ETH ESTRADIOL 0.25-35 MG-MCG PO TABS: 1.0000 | ORAL_TABLET | Freq: Every day | ORAL | 1 refills | Status: DC

## 2019-11-04 NOTE — Assessment & Plan Note (Signed)
After discussion she would like to proceed with psychotherapy.  We will set this up.

## 2019-11-04 NOTE — Assessment & Plan Note (Signed)
Regular menses even with the Nexplanon.  We will rule out infection so we will check wet prep along with gonorrhea chlamydia.  Urine pregnancy test was negative in the office.  I also went ahead and obtain Pap smear since she is overdue for this.  If these things are normal then I will proceed with starting her on Sprintec estrogen-containing oral contraception to come back her Nexplanon effects.  We will see how she does with a 21-day course of the hormone.  If her bleeding returns heavy like before I will get her over to GYN to see if there is an alternative for her Nexplanon.

## 2019-11-04 NOTE — Progress Notes (Signed)
Subjective:    Patient ID: Susan Branch, female    DOB: 1996-06-23, 24 y.o.   MRN: [id number]  Patient presents for Irregular Menses (has nexplanon implant- severe cramping and bleeding throughout month) Patient here with irregular menstrual cycle the past couple months.  She has a Nexplanon which was placed back in 2019.  States that her periods have been normal until recently.  She has had heavy cramping along with the bleeding.  This month she is actually had 2 cycles.  Her first one was heavy and lasted about 10 days and seem to go away now she is back to spotting.  She has cramps along with the bleeding.  Denies any UTI symptoms.  Has not noted any vaginal discharge.  No blood in the stool bowels are normal.  She is sexually active with one partner.  Migraine headaches this is been controlled for propanolol and when she does take her Maxalt it tends to make her go to sleep but she feels like her headaches are controlled.  Generalized anxiety and depression.  At her last visit a year ago she was started on Prozac 10 mg.  She states she took it for 2 weeks but it made her feel worse so she stopped the medication.  Her anxiety continues to be high she seems to be very irritable and she gets frustrated and angry very quick and lashes out on people.  She knows that she needs to do something about her mood changes.  Other times she just feels sad and depressed.  She is willing to try psychotherapy.  She is overdue for PAP Smear   Review Of Systems:  GEN- denies fatigue, fever, weight loss,weakness, recent illness HEENT- denies eye drainage, change in vision, nasal discharge, CVS- denies chest pain, palpitations RESP- denies SOB, cough, wheeze ABD- denies N/V, change in stools, abd pain GU- denies dysuria, hematuria, dribbling, incontinence MSK- denies joint pain, muscle aches, injury Neuro- denies headache, dizziness, syncope, seizure activity       Objective:    BP 130/74   Pulse 88    Temp 98.4 F (36.9 C) (Temporal)   Resp 16   Ht 5\' 4"  (1.626 m)   Wt 237 lb (107.5 kg)   BMI 40.68 kg/m  GEN- NAD, alert and oriented x3 CVS- RRR, no murmur RESP-CTAB ABD-NABS,soft,NT,ND Psych- normal affect and mood, very pleasant, no SI, phq 9 score  8  GU- normal external genitalia, vaginal mucosa pink and moist, cervix visualized no growth, no blood form os, minimal thin clear discharge, no CMT, no ovarian masses, uterus normal size EXT- No edema Pulses- Radial, DP- 2+        Assessment & Plan:      Problem List Items Addressed This Visit      Unprioritized   Depression, major, single episode, moderate ([place])    After discussion she would like to proceed with psychotherapy.  We will set this up.      GAD (generalized anxiety disorder)   Irregular menses - Primary    Regular menses even with the Nexplanon.  We will rule out infection so we will check wet prep along with gonorrhea chlamydia.  Urine pregnancy test was negative in the office.  I also went ahead and obtain Pap smear since she is overdue for this.  If these things are normal then I will proceed with starting her on Sprintec estrogen-containing oral contraception to come back her Nexplanon effects.  We will see how she  does with a 21-day course of the hormone.  If her bleeding returns heavy like before I will get her over to GYN to see if there is an alternative for her Nexplanon.      Relevant Orders   Pregnancy, urine (Completed)   WET PREP FOR TRICH, YEAST, CLUE (Completed)   C. trachomatis/N. gonorrhoeae RNA   Migraine headache    Controlled no changes to meds       Relevant Medications   propranolol ER (INDERAL LA) 60 MG 24 hr capsule   eletriptan (RELPAX) 40 MG tablet    Other Visit Diagnoses    Cervical cancer screening       Relevant Orders   Pap IG w/ reflex to HPV when ASC-U      Note: This dictation was prepared with Dragon dictation along with smaller phrase technology. Any  transcriptional errors that result from this process are unintentional.

## 2019-11-04 NOTE — Assessment & Plan Note (Signed)
Controlled no changes to meds 

## 2019-11-04 NOTE — Patient Instructions (Addendum)
Referral to therapy  Medications refilled We will call with results I have sent birth control to the pharmacy called sprintec but do not start yet until we call with your results  F/U 6 months for Physical

## 2019-11-05 LAB — C. TRACHOMATIS/N. GONORRHOEAE RNA
C. trachomatis RNA, TMA: NOT DETECTED
N. gonorrhoeae RNA, TMA: NOT DETECTED

## 2019-11-10 ENCOUNTER — Other Ambulatory Visit: Payer: Self-pay | Admitting: *Deleted

## 2019-11-10 DIAGNOSIS — R87810 Cervical high risk human papillomavirus (HPV) DNA test positive: Secondary | ICD-10-CM

## 2019-11-10 DIAGNOSIS — N926 Irregular menstruation, unspecified: Secondary | ICD-10-CM

## 2019-11-10 DIAGNOSIS — R87612 Low grade squamous intraepithelial lesion on cytologic smear of cervix (LGSIL): Secondary | ICD-10-CM

## 2019-11-10 LAB — PAP IG W/ RFLX HPV ASCU

## 2019-11-10 LAB — HUMAN PAPILLOMAVIRUS, HIGH RISK: HPV DNA High Risk: DETECTED — AB

## 2019-12-03 ENCOUNTER — Telehealth: Payer: Self-pay | Admitting: Family Medicine

## 2019-12-03 NOTE — Telephone Encounter (Signed)
Per Dr. Despina Hidden at [facility] patient does not need a follow up until 1 year later per the new cancer guidelines as of 2019. Patient was notified of this by [facility]. Dr. Jeanice Lim was also made aware.

## 2019-12-22 ENCOUNTER — Telehealth: Payer: Self-pay | Admitting: Obstetrics and Gynecology

## 2019-12-22 ENCOUNTER — Ambulatory Visit
Admission: EM | Admit: 2019-12-22 | Discharge: 2019-12-22 | Disposition: A | Discharge status: Home / Self Care | Payer: 59 | Attending: Emergency Medicine | Admitting: Emergency Medicine

## 2019-12-22 DIAGNOSIS — R519 Headache, unspecified: Secondary | ICD-10-CM

## 2019-12-22 MED ORDER — DEXAMETHASONE SODIUM PHOSPHATE 10 MG/ML IJ SOLN
10.0000 mg | Freq: Once | INTRAMUSCULAR | Status: AC
  Administered 2019-12-22: 12:00:00 10 mg via INTRAMUSCULAR

## 2019-12-22 MED ORDER — KETOROLAC TROMETHAMINE 30 MG/ML IJ SOLN
30.0000 mg | Freq: Once | INTRAMUSCULAR | Status: AC
  Administered 2019-12-22: 30 mg via INTRAMUSCULAR

## 2019-12-22 MED ORDER — ONDANSETRON 4 MG PO TBDP
4.0000 mg | ORAL_TABLET | Freq: Once | ORAL | Status: AC
  Administered 2019-12-22: 12:00:00 4 mg via ORAL

## 2019-12-22 NOTE — Discharge Instructions (Addendum)
Migraine cocktail given in office Rest, ice and heat as needed Expect some increased pain in the next 1-3 days.  It may take 3-4 weeks for complete resolution of symptoms Will f/u with her doctor or here if not seeing significant improvement within one week. Return here or go to ER if you have any new or worsening symptoms such as numbness/tingling of the inner thighs, loss of bladder or bowel control, severe headache/blurry vision, nausea/vomiting, confusion/altered mental status, dizziness, weakness, passing out, imbalance, etc..Marland Kitchen

## 2019-12-22 NOTE — Telephone Encounter (Signed)
Pt got Nexplanon placed in 2019. Pt started having pain around Nexplanon site and numbness in the whole arm for 2 months. Pt may want Nexplanon removed. Pt advised to call back to schedule an appt. Front short staffed. JSY
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
## 2019-12-22 NOTE — Telephone Encounter (Signed)
Pt states that her arm that has the nexplanon is having pain and numbness. She is unsure if it is related to the nexplanon or not. Requesting to speak with a nurse.
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
## 2020-05-04 ENCOUNTER — Ambulatory Visit (INDEPENDENT_AMBULATORY_CARE_PROVIDER_SITE_OTHER): Payer: 59 | Admitting: Family Medicine

## 2020-05-04 ENCOUNTER — Encounter: Payer: Self-pay | Admitting: Family Medicine

## 2020-05-04 ENCOUNTER — Other Ambulatory Visit: Payer: Self-pay

## 2020-05-04 VITALS — BP 128/76 | HR 88 | Temp 98.6°F | Resp 16 | Ht 64.0 in | Wt 242.0 lb

## 2020-05-04 DIAGNOSIS — F321 Major depressive disorder, single episode, moderate: Secondary | ICD-10-CM

## 2020-05-04 DIAGNOSIS — Z0001 Encounter for general adult medical examination with abnormal findings: Secondary | ICD-10-CM

## 2020-05-04 DIAGNOSIS — Z23 Encounter for immunization: Secondary | ICD-10-CM

## 2020-05-04 DIAGNOSIS — Z1159 Encounter for screening for other viral diseases: Secondary | ICD-10-CM

## 2020-05-04 DIAGNOSIS — Z0000 Encounter for general adult medical examination without abnormal findings: Secondary | ICD-10-CM

## 2020-05-04 DIAGNOSIS — F411 Generalized anxiety disorder: Secondary | ICD-10-CM | POA: Diagnosis not present

## 2020-05-04 DIAGNOSIS — Z833 Family history of diabetes mellitus: Secondary | ICD-10-CM

## 2020-05-04 DIAGNOSIS — Z6841 Body Mass Index (BMI) 40.0 and over, adult: Secondary | ICD-10-CM

## 2020-05-04 MED ORDER — ELETRIPTAN HYDROBROMIDE 40 MG PO TABS
ORAL_TABLET | ORAL | 2 refills | Status: DC
Start: 2020-05-04 — End: 2021-12-20

## 2020-05-04 MED ORDER — FLUOXETINE HCL 20 MG PO TABS: 20.0000 mg | ORAL_TABLET | Freq: Every day | ORAL | 3 refills | Status: DC

## 2020-05-04 MED ORDER — PROPRANOLOL HCL ER 60 MG PO CP24
60.0000 mg | ORAL_CAPSULE | Freq: Every day | ORAL | 6 refills | Status: DC
Start: 2020-05-04 — End: 2021-12-20

## 2020-05-04 NOTE — Assessment & Plan Note (Signed)
Discussed dietary changes Screen for diabetes

## 2020-05-04 NOTE — Assessment & Plan Note (Signed)
Both anxiety and depressive symptoms Referral to therapy see if we can find a cheaper one Restart prozac at 20mg  once a day  F/u in a few weeks

## 2020-05-04 NOTE — Progress Notes (Signed)
Subjective:    Patient ID: Susan Branch, female    DOB: 04-01-1996, 24 y.o.   MRN: [id number]  Patient presents for Annual Exam (is fasating)  Migraines-doing well on propanolol.  She has not use the Relpax.  ANXIETY/Depression   she continues to have a lot of stress at home.  She would like to go back to school but is worried about the findings.  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  She lives with her boyfriend and her boyfriend's mother her boyfriend is not working and that stresses her.  She is also worried about her family's health concern father has been in the hospital recently.  She would like to go back on her medication to help with her nerves.  We did try to set her up for psychotherapy however they required a significant down payment before she could be seen and she did not have the money to do so.  Contraception- She still has some irregular bleeding but not severe, has 1 more year left nexplann  Sexually active 1 partner   PAP Smear UTD - had LSIL with HPV, reviewe dby GYN told repeat PAPA in Feb of next year   Obesity- she drinking more water, cut down fast food eatin more veggies, meatless meals   Occ ETOH,  COVID -19 Vaccine UTD, due for TDAP   Review Of Systems:  GEN- denies fatigue, fever, weight loss,weakness, recent illness HEENT- denies eye drainage, change in vision, nasal discharge, CVS- denies chest pain, palpitations RESP- denies SOB, cough, wheeze ABD- denies N/V, change in stools, abd pain GU- denies dysuria, hematuria, dribbling, incontinence MSK- denies joint pain, muscle aches, injury Neuro- denies headache, dizziness, syncope, seizure activity       Objective:    BP 128/76   Pulse 88   Temp 98.6 F (37 C) (Temporal)   Resp 16   Ht 5\' 4"  (1.626 m)   Wt 242 lb (109.8 kg)   SpO2 95%   BMI 41.54 kg/m  GEN- NAD, alert and oriented x3 HEENT- PERRL, EOMI, non injected sclera,  pink conjunctiva, MMM, oropharynx clear Neck- Supple, no thyromegaly CVS- RRR, no murmur RESP-CTAB ABD-NABS,soft,NT,ND Psych - normal affect and mood PHQ 9 score 14 EXT- No edema Pulses- Radial, DP- 2+        Assessment & Plan:      Problem List Items Addressed This Visit      Unprioritized   Depression, major, single episode, moderate ([facility])    Both anxiety and depressive symptoms Referral to therapy see if we can find a cheaper one Restart prozac at 20mg  once a day  F/u in a few weeks      Relevant Medications   FLUoxetine (PROZAC) 20 MG tablet   GAD (generalized anxiety disorder)   Relevant Medications   FLUoxetine (PROZAC) 20 MG tablet   Obesity    Discussed dietary changes Screen for diabetes       Other Visit Diagnoses    Routine general medical examination at a health care facility    -  Primary   CPE done, TDAP given, fasting labs, GYN UTD, repeat PAP next Feb   Relevant Orders   CBC with Differential/Platelet   Comprehensive metabolic panel   Lipid panel   Hemoglobin A1c   TSH   Tdap vaccine greater than or equal to 7yo IM (Completed)   Family history of diabetes mellitus (DM)  Need for hepatitis C screening test       Relevant Orders   Hepatitis C antibody   Need for tetanus, diphtheria, and acellular pertussis (Tdap) vaccine in patient of adolescent age or older       Relevant Orders   Tdap vaccine greater than or equal to 7yo IM (Completed)      Note: This dictation was prepared with Dragon dictation along with smaller phrase technology. Any transcriptional errors that result from this process are unintentional.

## 2020-05-04 NOTE — Patient Instructions (Addendum)
Start prozac 20mg  once a day  Continue propranolol for headaches We will call with lab results Tetanus Booster given  F/U 6 weeks in office or Telehealth

## 2020-05-05 LAB — CBC WITH DIFFERENTIAL/PLATELET
Absolute Monocytes: 694 cells/uL (ref 200–950)
Basophils Absolute: 67 cells/uL (ref 0–200)
Basophils Relative: 0.6 %
Eosinophils Absolute: 504 cells/uL — ABNORMAL HIGH (ref 15–500)
Eosinophils Relative: 4.5 %
HCT: 42.9 % (ref 35.0–45.0)
Hemoglobin: 14.2 g/dL (ref 11.7–15.5)
Lymphs Abs: 2352 cells/uL (ref 850–3900)
MCH: 28.9 pg (ref 27.0–33.0)
MCHC: 33.1 g/dL (ref 32.0–36.0)
MCV: 87.4 fL (ref 80.0–100.0)
MPV: 11 fL (ref 7.5–12.5)
Monocytes Relative: 6.2 %
Neutro Abs: 7582 cells/uL (ref 1500–7800)
Neutrophils Relative %: 67.7 %
Platelets: 407 10*3/uL — ABNORMAL HIGH (ref 140–400)
RBC: 4.91 10*6/uL (ref 3.80–5.10)
RDW: 12.5 % (ref 11.0–15.0)
Total Lymphocyte: 21 %
WBC: 11.2 10*3/uL — ABNORMAL HIGH (ref 3.8–10.8)

## 2020-05-05 LAB — COMPREHENSIVE METABOLIC PANEL
AG Ratio: 1.5 (calc) (ref 1.0–2.5)
ALT: 15 U/L (ref 6–29)
AST: 13 U/L (ref 10–30)
Albumin: 4.1 g/dL (ref 3.6–5.1)
Alkaline phosphatase (APISO): 70 U/L (ref 31–125)
BUN: 13 mg/dL (ref 7–25)
CO2: 25 mmol/L (ref 20–32)
Calcium: 9.4 mg/dL (ref 8.6–10.2)
Chloride: 104 mmol/L (ref 98–110)
Creat: 0.84 mg/dL (ref 0.50–1.10)
Globulin: 2.8 g/dL (calc) (ref 1.9–3.7)
Glucose, Bld: 99 mg/dL (ref 65–99)
Potassium: 4.5 mmol/L (ref 3.5–5.3)
Sodium: 139 mmol/L (ref 135–146)
Total Bilirubin: 0.3 mg/dL (ref 0.2–1.2)
Total Protein: 6.9 g/dL (ref 6.1–8.1)

## 2020-05-05 LAB — HEMOGLOBIN A1C
Hgb A1c MFr Bld: 5.6 % of total Hgb (ref <5.7)
Mean Plasma Glucose: 114 (calc)
eAG (mmol/L): 6.3 (calc)

## 2020-05-05 LAB — LIPID PANEL
Cholesterol: 190 mg/dL (ref <200)
HDL: 46 mg/dL — ABNORMAL LOW (ref >=50)
LDL Cholesterol (Calc): 127 mg/dL (calc) — ABNORMAL HIGH
Non-HDL Cholesterol (Calc): 144 mg/dL (calc) — ABNORMAL HIGH (ref <130)
Total CHOL/HDL Ratio: 4.1 (calc) (ref <5.0)
Triglycerides: 78 mg/dL (ref <150)

## 2020-05-05 LAB — HEPATITIS C ANTIBODY
Hepatitis C Ab: NONREACTIVE
SIGNAL TO CUT-OFF: 0.13 (ref <1.00)

## 2020-05-05 LAB — TSH: TSH: 0.71 mIU/L

## 2020-05-17 ENCOUNTER — Ambulatory Visit (INDEPENDENT_AMBULATORY_CARE_PROVIDER_SITE_OTHER): Payer: 59 | Admitting: Psychology

## 2020-05-17 DIAGNOSIS — F321 Major depressive disorder, single episode, moderate: Secondary | ICD-10-CM | POA: Diagnosis not present

## 2020-05-31 ENCOUNTER — Ambulatory Visit (INDEPENDENT_AMBULATORY_CARE_PROVIDER_SITE_OTHER): Payer: 59 | Admitting: Psychology

## 2020-05-31 DIAGNOSIS — F321 Major depressive disorder, single episode, moderate: Secondary | ICD-10-CM

## 2020-06-14 ENCOUNTER — Ambulatory Visit (INDEPENDENT_AMBULATORY_CARE_PROVIDER_SITE_OTHER): Payer: 59 | Admitting: Psychology

## 2020-06-14 DIAGNOSIS — F321 Major depressive disorder, single episode, moderate: Secondary | ICD-10-CM | POA: Diagnosis not present

## 2020-06-15 ENCOUNTER — Encounter: Payer: Self-pay | Admitting: Family Medicine

## 2020-06-15 ENCOUNTER — Other Ambulatory Visit: Payer: Self-pay

## 2020-06-15 ENCOUNTER — Ambulatory Visit (INDEPENDENT_AMBULATORY_CARE_PROVIDER_SITE_OTHER): Payer: 59 | Admitting: Family Medicine

## 2020-06-15 VITALS — BP 124/80 | HR 68 | Temp 97.9°F | Resp 14 | Ht 64.0 in | Wt 236.0 lb

## 2020-06-15 DIAGNOSIS — F321 Major depressive disorder, single episode, moderate: Secondary | ICD-10-CM

## 2020-06-15 DIAGNOSIS — R0982 Postnasal drip: Secondary | ICD-10-CM | POA: Diagnosis not present

## 2020-06-15 DIAGNOSIS — F411 Generalized anxiety disorder: Secondary | ICD-10-CM

## 2020-06-15 MED ORDER — FLUOXETINE HCL 40 MG PO CAPS: 40.0000 mg | ORAL_CAPSULE | Freq: Every day | ORAL | 3 refills | Status: DC

## 2020-06-15 NOTE — Progress Notes (Signed)
   Subjective:    Patient ID: Susan Branch, female    DOB: [birth year], 24 y.o.   MRN: [id number]  Patient presents for Follow-up (is fasting) and Head Congestion (states that she has a lot of mucus draining down her throat)  Here to follow-up medications.  She was started on Prozac 20 mg at the last visit.  She was also referred to psychotherapy.  She did start psychotherapy with low Nechama Guard and has had 3 visits which she felt has been very helpful.  She has been seen every 2 weeks.  She does admit that her depressed mood in mood swings have improved but they are still lingering some.  Her sleep is also better.  She is back with her therapist and they thought that she would benefit from a higher dose of medication.  Her other concern was drainage in the back of her throat that has been present for weeks.  She denies any cough congestion no difficulty breathing no sore throat.  But always has mucus in her throat.  She has tried taking some Mucinex.  She has not used any allergy medication.  She has had nasal sprays but has not used these.   Review Of Systems:  GEN- denies fatigue, fever, weight loss,weakness, recent illness HEENT- denies eye drainage, change in vision, nasal discharge, CVS- denies chest pain, palpitations RESP- denies SOB, cough, wheeze ABD- denies N/V, change in stools, abd pain GU- denies dysuria, hematuria, dribbling, incontinence MSK- denies joint pain, muscle aches, injury Neuro- denies headache, dizziness, syncope, seizure activity       Objective:    BP 124/80   Pulse 68   Temp 97.9 F (36.6 C) (Temporal)   Resp 14   Ht 5\' 4"  (1.626 m)   Wt 236 lb (107 kg)   SpO2 97%   BMI 40.51 kg/m  GEN- NAD, alert and oriented x3 HEENT- PERRL, EOMI, non injected sclera, pink conjunctiva, MMM, oropharynx clear, nares clear rhinorrhea , no sinus tenderness  Neck- Supple, no LAD  CVS- RRR, no murmur RESP-CTAB Psych normal affect and mood         Assessment & Plan:       Problem List Items Addressed This Visit      Unprioritized   Depression, major, single episode, moderate ([facility])    Increase prozac to 40mg  once a day Continue with psychotherapy Call for any concerns F/u in a couple of months Symptoms have improved       Relevant Medications   FLUoxetine (PROZAC) 40 MG capsule   GAD (generalized anxiety disorder)   Relevant Medications   FLUoxetine (PROZAC) 40 MG capsule    Other Visit Diagnoses    Post-nasal drip    -  Primary   restart nasal spray,given samples of zyrtec       Note: This dictation was prepared with Dragon dictation along with smaller phrase technology. Any transcriptional errors that result from this process are unintentional.

## 2020-06-15 NOTE — Assessment & Plan Note (Signed)
Increase prozac to 40mg  once a day Continue with psychotherapy Call for any concerns F/u in a couple of months Symptoms have improved

## 2020-06-15 NOTE — Patient Instructions (Signed)
Use the nasal spray  Take zyrtec Prozac increased to 40mg  F/U 2 months

## 2020-06-28 ENCOUNTER — Ambulatory Visit (INDEPENDENT_AMBULATORY_CARE_PROVIDER_SITE_OTHER): Payer: 59 | Admitting: Psychology

## 2020-06-28 DIAGNOSIS — F321 Major depressive disorder, single episode, moderate: Secondary | ICD-10-CM

## 2020-07-05 ENCOUNTER — Ambulatory Visit (INDEPENDENT_AMBULATORY_CARE_PROVIDER_SITE_OTHER): Payer: 59 | Admitting: Psychology

## 2020-07-05 DIAGNOSIS — F321 Major depressive disorder, single episode, moderate: Secondary | ICD-10-CM | POA: Diagnosis not present

## 2020-07-12 ENCOUNTER — Ambulatory Visit: Payer: 59 | Admitting: Psychology

## 2020-07-20 ENCOUNTER — Ambulatory Visit (INDEPENDENT_AMBULATORY_CARE_PROVIDER_SITE_OTHER): Payer: 59 | Admitting: Psychology

## 2020-07-20 DIAGNOSIS — F321 Major depressive disorder, single episode, moderate: Secondary | ICD-10-CM

## 2020-07-26 ENCOUNTER — Ambulatory Visit (INDEPENDENT_AMBULATORY_CARE_PROVIDER_SITE_OTHER): Payer: 59 | Admitting: Psychology

## 2020-07-26 DIAGNOSIS — F321 Major depressive disorder, single episode, moderate: Secondary | ICD-10-CM

## 2020-08-09 ENCOUNTER — Ambulatory Visit (INDEPENDENT_AMBULATORY_CARE_PROVIDER_SITE_OTHER): Payer: 59 | Admitting: Psychology

## 2020-08-09 DIAGNOSIS — F321 Major depressive disorder, single episode, moderate: Secondary | ICD-10-CM | POA: Diagnosis not present

## 2020-08-22 ENCOUNTER — Ambulatory Visit: Payer: 59 | Admitting: Family Medicine

## 2020-08-23 ENCOUNTER — Ambulatory Visit: Payer: 59 | Admitting: Psychology

## 2020-09-06 ENCOUNTER — Ambulatory Visit (INDEPENDENT_AMBULATORY_CARE_PROVIDER_SITE_OTHER): Payer: 59 | Admitting: Psychology

## 2020-09-06 DIAGNOSIS — F321 Major depressive disorder, single episode, moderate: Secondary | ICD-10-CM | POA: Diagnosis not present

## 2020-09-07 ENCOUNTER — Other Ambulatory Visit: Payer: 59

## 2020-09-07 DIAGNOSIS — Z20822 Contact with and (suspected) exposure to covid-19: Secondary | ICD-10-CM

## 2020-09-08 LAB — NOVEL CORONAVIRUS, NAA: SARS-CoV-2, NAA: NOT DETECTED

## 2020-09-08 LAB — SARS-COV-2, NAA 2 DAY TAT

## 2020-09-19 ENCOUNTER — Ambulatory Visit (INDEPENDENT_AMBULATORY_CARE_PROVIDER_SITE_OTHER): Payer: 59 | Admitting: Psychology

## 2020-09-19 DIAGNOSIS — F321 Major depressive disorder, single episode, moderate: Secondary | ICD-10-CM | POA: Diagnosis not present

## 2020-09-20 ENCOUNTER — Ambulatory Visit: Payer: 59 | Admitting: Psychology

## 2020-09-21 ENCOUNTER — Ambulatory Visit: Payer: 59 | Admitting: Family Medicine

## 2020-09-27 ENCOUNTER — Encounter: Payer: Self-pay | Admitting: Family Medicine

## 2020-09-27 ENCOUNTER — Other Ambulatory Visit: Payer: Self-pay

## 2020-09-27 ENCOUNTER — Telehealth (INDEPENDENT_AMBULATORY_CARE_PROVIDER_SITE_OTHER): Payer: 59 | Admitting: Family Medicine

## 2020-09-27 DIAGNOSIS — F321 Major depressive disorder, single episode, moderate: Secondary | ICD-10-CM

## 2020-09-27 DIAGNOSIS — F411 Generalized anxiety disorder: Secondary | ICD-10-CM | POA: Diagnosis not present

## 2020-09-27 MED ORDER — SERTRALINE HCL 25 MG PO TABS: ORAL_TABLET | ORAL | 1 refills | Status: DC

## 2020-09-27 NOTE — Progress Notes (Signed)
Virtual Visit via Telephone Note  I connected with Susan Branch on 09/27/20 at 9:25am by telephone and verified that I am speaking with the correct person using two identifiers.  Location: Patient: at home Provider: AT HOME    I discussed the limitations, risks, security and privacy concerns of performing an evaluation and management service by telephone and the availability of in person appointments. I also discussed with the patient that there may be a patient responsible charge related to this service. The patient expressed understanding and agreed to proceed.   History of Present Illness: MDD/GAD - follow up visit to discuss medications. She has been on prozac since end Lacey. Does was adjusted to 40mg  in Oct. Initially she felt a little change in her anxiety but now it seems like the medicine doesn't work at all. She feels the same, down and depressed and gets anxious randomly. She is working with a Nov at [facility] [employer] weekly with virtual visits. Her sleep is okay and she is working which she enjoys, but the down town she feels depressed and often cant put it into words. She has been drinking alcohol a few days a week when she feels her self being very anxious or having an anxiety attack.  Her most recent episode she woke up in the morning and just felt anxious for no particular reason her chest felt tight and she felt like she couldn't breathe., her friend was over but instead of talking about her anxiety she decided to drink ETOH until she felt more calm No SE    Observations/Objective: NAD noted over phone   Assessment and Plan: MDD with GAD, strong family history of mental health problems as well. Continue with psychotherapy As she feels no real benefit from prozac will taper off and try zoloft. Her mother is on this medicine. Hopefully it will target the anxiety better. I did consider adding abilify but she states more anxiety than depression. Discussed not drinking ETOH to  help her symptoms. This interferes with how well the meds work and she can develop a depedence will f/u with in 2-3 weeks to see how she is doing on meds  For taper- decrease to prozac 20mg  daiy x 1 week, pt has at home, then stop for 1 week for washout, then start zoloft 25mg  once a day   Follow Up Instructions: Therapy weekly, phone in 2-3 weeks    I discussed the assessment and treatment plan with the patient. The patient was provided an opportunity to ask questions and all were answered. The patient agreed with the plan and demonstrated an understanding of the instructions.   The patient was advised to call back or seek an in-person evaluation if the symptoms worsen or if the condition fails to improve as anticipated.  I provided 18 minutes of non-face-to-face time during this encounter. End Time: 9:43pm  Haig Prophet, MD

## 2020-09-29 ENCOUNTER — Ambulatory Visit (INDEPENDENT_AMBULATORY_CARE_PROVIDER_SITE_OTHER): Payer: 59 | Admitting: Psychology

## 2020-09-29 DIAGNOSIS — F321 Major depressive disorder, single episode, moderate: Secondary | ICD-10-CM

## 2020-10-04 ENCOUNTER — Ambulatory Visit: Payer: 59 | Admitting: Psychology

## 2020-10-05 ENCOUNTER — Ambulatory Visit: Payer: 59 | Admitting: Psychology

## 2020-10-10 ENCOUNTER — Ambulatory Visit (INDEPENDENT_AMBULATORY_CARE_PROVIDER_SITE_OTHER): Payer: 59 | Admitting: Psychology

## 2020-10-10 DIAGNOSIS — F321 Major depressive disorder, single episode, moderate: Secondary | ICD-10-CM | POA: Diagnosis not present

## 2020-10-13 ENCOUNTER — Ambulatory Visit: Payer: 59 | Admitting: Psychology

## 2020-10-18 ENCOUNTER — Ambulatory Visit: Payer: 59 | Admitting: Psychology

## 2020-10-24 ENCOUNTER — Ambulatory Visit: Payer: 59 | Admitting: Psychology

## 2020-10-27 ENCOUNTER — Ambulatory Visit: Payer: 59 | Admitting: Psychology

## 2020-10-31 ENCOUNTER — Telehealth: Payer: Self-pay | Admitting: *Deleted

## 2020-10-31 NOTE — Telephone Encounter (Signed)
-----   Message from Salley Scarlet, MD sent at 10/28/2020 11:40 AM EST ----- Regarding: FW: f/u zoloft Call pt see howshe is doing on zoloft?    ----- Message ----- From: Salley Scarlet, MD Sent: 10/25/2020  12:00 AM EST To: Salley Scarlet, MD Subject: f/u zoloft

## 2020-10-31 NOTE — Telephone Encounter (Signed)
Call placed to patient. LMTRC.  

## 2020-11-01 ENCOUNTER — Ambulatory Visit: Payer: 59 | Admitting: Psychology

## 2020-11-02 NOTE — Telephone Encounter (Signed)
Agree, pt just switched over, no change in dose

## 2020-11-02 NOTE — Telephone Encounter (Signed)
Call placed to patient.   States that she feels like she is doing well on medication. States that she has been taking Zoloft x1 week.   Reports that she has not noted any side effects, but she has not seen much improved. States that she is aware it will need to build up in system.  Reports that she will continue current dose and we will F/U in another week.

## 2020-11-07 ENCOUNTER — Ambulatory Visit: Payer: 59 | Admitting: Psychology

## 2020-11-09 ENCOUNTER — Ambulatory Visit
Admission: EM | Admit: 2020-11-09 | Discharge: 2020-11-09 | Disposition: A | Discharge status: Home / Self Care | Payer: 59 | Attending: Emergency Medicine | Admitting: Emergency Medicine

## 2020-11-09 ENCOUNTER — Encounter: Payer: Self-pay | Admitting: Emergency Medicine

## 2020-11-09 ENCOUNTER — Other Ambulatory Visit: Payer: Self-pay

## 2020-11-09 DIAGNOSIS — J0140 Acute pansinusitis, unspecified: Secondary | ICD-10-CM

## 2020-11-09 MED ORDER — CETIRIZINE HCL 10 MG PO TABS: 10.0000 mg | ORAL_TABLET | Freq: Every day | ORAL | 0 refills | Status: DC

## 2020-11-09 MED ORDER — FLUTICASONE PROPIONATE 50 MCG/ACT NA SUSP: 1.0000 | Freq: Every day | NASAL | 0 refills | Status: DC

## 2020-11-09 MED ORDER — AMOXICILLIN-POT CLAVULANATE 875-125 MG PO TABS: 1.0000 | ORAL_TABLET | Freq: Two times a day (BID) | ORAL | 0 refills | Status: DC

## 2020-11-09 MED ORDER — BENZONATATE 100 MG PO CAPS: 100.0000 mg | ORAL_CAPSULE | Freq: Three times a day (TID) | ORAL | 0 refills | Status: DC

## 2020-11-09 NOTE — ED Triage Notes (Signed)
Runny nose and headache.  States her allergies are flared up.  Yellow nasal discharge.

## 2020-11-09 NOTE — Discharge Instructions (Addendum)
Get plenty of rest and push fluids Tessalon Perles prescribed for cough zyrtec for nasal congestion, runny nose, and/or sore throat  flonase for nasal congestion and runny nose Augmentin prescribed for possible sinusitis Use medications daily for symptom relief Use OTC medications like ibuprofen or tylenol as needed fever or pain Call or go to the ED if you have any new or worsening symptoms such as fever, worsening cough, shortness of breath, chest tightness, chest pain, turning blue, changes in mental status, etc..Marland Kitchen

## 2020-11-09 NOTE — ED Provider Notes (Addendum)
[facility]ENTER   [id number] 11/09/20 Arrival Time: 0847   CC: URI  SUBJECTIVE: History from: patient.  Susan Branch is a 25 y.o. female who presented to the urgent care with a complaint of cough, nasal congestion with green nasal discharge and sinus pressure for the past 3 days.  Denies sick exposure to COVID, flu or strep.  Denies recent travel.  Has tried OTC medication without relief.  Denies any aggravating factor.  Denies previous symptoms in the past.   Denies fever, chills, fatigue, sinus pain,  sore throat, SOB, wheezing, chest pain, nausea, changes in bowel or bladder habits.     ROS: As per HPI.  All other pertinent ROS negative.     Past Medical History:  Diagnosis Date  . Allergy   . Anxiety   . Borderline hypertension    no current med.  Marland Kitchen Headache   . Tonsillar and adenoid hypertrophy 05/2013   snores during sleep, mother denies apnea   Past Surgical History:  Procedure Laterality Date  . TONSILLECTOMY AND ADENOIDECTOMY N/A 06/02/2013   Procedure: TONSILLECTOMY AND ADENOIDECTOMY;  Surgeon: [name], MD;  Location: [facility];  Service: ENT;  Laterality: N/A;  . TURBINATE REDUCTION Bilateral 06/02/2013   Procedure: TURBINATE REDUCTION BILATERAL;  Surgeon: [name], MD;  Location: [facility];  Service: ENT;  Laterality: Bilateral;   Allergies  Allergen Reactions  . Adhesive [Tape] Rash  . Latex Rash  . Soap Rash   No current facility-administered medications on file prior to encounter.   Current Outpatient Medications on File Prior to Encounter  Medication Sig Dispense Refill  . eletriptan (RELPAX) 40 MG tablet One tablet by mouth at onset of headache. May repeat in 2 hours if headache persists or recurs. 10 tablet 2  . FLUoxetine (PROZAC) 40 MG capsule Take 1 capsule (40 mg total) by mouth daily. 30 capsule 3  . ipratropium (ATROVENT) 0.06 % nasal spray Place 2 sprays into both nostrils 4 (four) times daily. 15 mL 0  .  propranolol ER (INDERAL LA) 60 MG 24 hr capsule Take 1 capsule (60 mg total) by mouth daily. For migraines 30 capsule 6  . sertraline (ZOLOFT) 25 MG tablet Take 1 tablet daily  x 1 week, then increase to 2 tablets once a day 60 tablet 1  . [DISCONTINUED] norgestimate-ethinyl estradiol (SPRINTEC 28) 0.25-35 MG-MCG tablet Take 1 tablet by mouth daily. 1 Package 1   Social History   Socioeconomic History  . Marital status: Single    Spouse name: Not on file  . Number of children: Not on file  . Years of education: Not on file  . Highest education level: Not on file  Occupational History  . Not on file  Tobacco Use  . Smoking status: Passive Smoke Exposure - Never Smoker  . Smokeless tobacco: Never Used  . Tobacco comment: father smokes outside  Substance and Sexual Activity  . Alcohol use: No  . Drug use: Yes    Types: Marijuana  . Sexual activity: Yes    Birth control/protection: None  Other Topics Concern  . Not on file  Social History Narrative  . Not on file   Social Determinants of Health   Financial Resource Strain: Not on file  Food Insecurity: Not on file  Transportation Needs: Not on file  Physical Activity: Not on file  Stress: Not on file  Social Connections: Not on file  Intimate Partner Violence: Not on file  Family History  Problem Relation Age of Onset  . Diabetes Mother   . Diabetes Father   . Hypertension Father   . Heart disease Father        MI  . Hyperlipidemia Father   . Kidney disease Father   . Vision loss Father   . Vision loss Maternal Grandmother     OBJECTIVE:  Vitals:   11/09/20 0936  BP: 134/87  Pulse: 82  Resp: 18  Temp: 98.2 F (36.8 C)  TempSrc: Oral  SpO2: 98%     General appearance: alert; appears fatigued, but nontoxic; speaking in full sentences and tolerating own secretions HEENT: NCAT; Ears: EACs clear, TMs pearly gray; Eyes: PERRL.  EOM grossly intact.  Maxillary sinuses tender; Nose: nares patent without  rhinorrhea, Throat: oropharynx clear, tonsils non erythematous or enlarged, uvula midline  Neck: supple without LAD Lungs: unlabored respirations, symmetrical air entry; cough: mild; no respiratory distress; CTAB Heart: regular rate and rhythm.  Radial pulses 2+ symmetrical bilaterally Skin: warm and dry Psychological: alert and cooperative; normal mood and affect  LABS:  No results found for this or any previous visit (from the past 24 hour(s)).   ASSESSMENT & PLAN:  1. Acute non-recurrent pansinusitis     Meds ordered this encounter  Medications  . amoxicillin-clavulanate (AUGMENTIN) 875-125 MG tablet    Sig: Take 1 tablet by mouth every 12 (twelve) hours.    Dispense:  14 tablet    Refill:  0  . cetirizine (ZYRTEC ALLERGY) 10 MG tablet    Sig: Take 1 tablet (10 mg total) by mouth daily.    Dispense:  30 tablet    Refill:  0  . fluticasone (FLONASE) 50 MCG/ACT nasal spray    Sig: Place 1 spray into both nostrils daily for 14 days.    Dispense:  16 g    Refill:  0  . benzonatate (TESSALON) 100 MG capsule    Sig: Take 1 capsule (100 mg total) by mouth every 8 (eight) hours.    Dispense:  21 capsule    Refill:  0   Patient is stable at discharge.  Symptom is likely an allergy related to the fact that she has a green nasal discharge.  Will prescribe Augmentin for possible pansinusitis.   Discharge instructions   Get plenty of rest and push fluids Tessalon Perles prescribed for cough zyrtec for nasal congestion, runny nose, and/or sore throat  flonase for nasal congestion and runny nose Augmentin prescribed for possible sinusitis Use medications daily for symptom relief Use OTC medications like ibuprofen or tylenol as needed fever or pain Call or go to the ED if you have any new or worsening symptoms such as fever, worsening cough, shortness of breath, chest tightness, chest pain, turning blue, changes in mental status, etc...   Reviewed expectations re: course of  current medical issues. Questions answered. Outlined signs and symptoms indicating need for more acute intervention. Patient verbalized understanding. After Visit Summary given.         Durward Parcel, FNP 11/09/20 0951    Durward Parcel, FNP 11/09/20 [phone]

## 2020-11-10 ENCOUNTER — Ambulatory Visit: Payer: 59 | Admitting: Psychology

## 2020-11-15 ENCOUNTER — Ambulatory Visit: Payer: 59 | Admitting: Psychology

## 2020-11-21 ENCOUNTER — Ambulatory Visit: Payer: 59 | Admitting: Psychology

## 2020-11-24 ENCOUNTER — Ambulatory Visit: Payer: 59 | Admitting: Psychology

## 2020-11-29 ENCOUNTER — Ambulatory Visit: Payer: 59 | Admitting: Psychology

## 2020-11-30 ENCOUNTER — Ambulatory Visit
Admission: EM | Admit: 2020-11-30 | Discharge: 2020-11-30 | Disposition: A | Discharge status: Home / Self Care | Payer: 59 | Attending: Emergency Medicine | Admitting: Emergency Medicine

## 2020-11-30 ENCOUNTER — Other Ambulatory Visit: Payer: Self-pay

## 2020-11-30 ENCOUNTER — Ambulatory Visit (INDEPENDENT_AMBULATORY_CARE_PROVIDER_SITE_OTHER): Payer: 59

## 2020-11-30 DIAGNOSIS — S99922A Unspecified injury of left foot, initial encounter: Secondary | ICD-10-CM

## 2020-11-30 DIAGNOSIS — M79672 Pain in left foot: Secondary | ICD-10-CM

## 2020-11-30 DIAGNOSIS — M79675 Pain in left toe(s): Secondary | ICD-10-CM

## 2020-11-30 MED ORDER — MELOXICAM 15 MG PO TABS: 15.0000 mg | ORAL_TABLET | Freq: Every day | ORAL | 0 refills | Status: DC

## 2020-11-30 NOTE — ED Triage Notes (Signed)
Pt presents with left toe injury from hitting dresser last night, 2nd third and 4 toe involved

## 2020-11-30 NOTE — ED Provider Notes (Signed)
[facility]ENTER   [id number] 11/30/20 Arrival Time: 9379  CC: foot pain  SUBJECTIVE: History from: patient. Susan Branch is a 25 y.o. female complains of LT foot/ toe pain and injury x last night.  Stubbed toes on dresser.  Localizes the pain to the 2-4th digits of foot.  Describes the pain as constant and throbbing in character.  Has NOT tried OTC medications.  Symptoms are made worse with weight-bearing.  Denies similar symptoms in the past.  Complains of swelling, now resolved.  Denies fever, chills, erythema, ecchymosis, weakness, numbness and tingling.  ROS: As per HPI.  All other pertinent ROS negative.     Past Medical History:  Diagnosis Date  . Allergy   . Anxiety   . Borderline hypertension    no current med.  Marland Kitchen Headache   . Tonsillar and adenoid hypertrophy 05/2013   snores during sleep, mother denies apnea   Past Surgical History:  Procedure Laterality Date  . TONSILLECTOMY AND ADENOIDECTOMY N/A 06/02/2013   Procedure: TONSILLECTOMY AND ADENOIDECTOMY;  Surgeon: [name], MD;  Location: [facility];  Service: ENT;  Laterality: N/A;  . TURBINATE REDUCTION Bilateral 06/02/2013   Procedure: TURBINATE REDUCTION BILATERAL;  Surgeon: [name], MD;  Location: [facility];  Service: ENT;  Laterality: Bilateral;   Allergies  Allergen Reactions  . Adhesive [Tape] Rash  . Latex Rash  . Soap Rash   No current facility-administered medications on file prior to encounter.   Current Outpatient Medications on File Prior to Encounter  Medication Sig Dispense Refill  . eletriptan (RELPAX) 40 MG tablet One tablet by mouth at onset of headache. May repeat in 2 hours if headache persists or recurs. 10 tablet 2  . FLUoxetine (PROZAC) 40 MG capsule Take 1 capsule (40 mg total) by mouth daily. 30 capsule 3  . ipratropium (ATROVENT) 0.06 % nasal spray Place 2 sprays into both nostrils 4 (four) times daily. 15 mL 0  . propranolol ER (INDERAL LA) 60 MG  24 hr capsule Take 1 capsule (60 mg total) by mouth daily. For migraines 30 capsule 6  . sertraline (ZOLOFT) 25 MG tablet Take 1 tablet daily  x 1 week, then increase to 2 tablets once a day 60 tablet 1  . [DISCONTINUED] cetirizine (ZYRTEC ALLERGY) 10 MG tablet Take 1 tablet (10 mg total) by mouth daily. 30 tablet 0  . [DISCONTINUED] fluticasone (FLONASE) 50 MCG/ACT nasal spray Place 1 spray into both nostrils daily for 14 days. 16 g 0  . [DISCONTINUED] norgestimate-ethinyl estradiol (SPRINTEC 28) 0.25-35 MG-MCG tablet Take 1 tablet by mouth daily. 1 Package 1   Social History   Socioeconomic History  . Marital status: Single    Spouse name: Not on file  . Number of children: Not on file  . Years of education: Not on file  . Highest education level: Not on file  Occupational History  . Not on file  Tobacco Use  . Smoking status: Passive Smoke Exposure - Never Smoker  . Smokeless tobacco: Never Used  . Tobacco comment: father smokes outside  Substance and Sexual Activity  . Alcohol use: No  . Drug use: Yes    Types: Marijuana  . Sexual activity: Yes    Birth control/protection: None  Other Topics Concern  . Not on file  Social History Narrative  . Not on file   Social Determinants of Health   Financial Resource Strain: Not on file  Food Insecurity: Not  on file  Transportation Needs: Not on file  Physical Activity: Not on file  Stress: Not on file  Social Connections: Not on file  Intimate Partner Violence: Not on file   Family History  Problem Relation Age of Onset  . Diabetes Mother   . Diabetes Father   . Hypertension Father   . Heart disease Father        MI  . Hyperlipidemia Father   . Kidney disease Father   . Vision loss Father   . Vision loss Maternal Grandmother     OBJECTIVE:  Vitals:   11/30/20 0831  BP: 127/83  Pulse: 88  Resp: 18  Temp: 98.4 F (36.9 C)  SpO2: 96%    General appearance: ALERT; in no acute distress.  Head: NCAT Lungs:  Normal respiratory effort CV: Dorsalis pedis pulse 2+ Musculoskeletal: LT foot Inspection: Skin warm, dry, clear and intact without obvious erythema, effusion, or ecchymosis.  Palpation: diffusely TTP over 2-4th digits of LT foot ROM: FROM active and passive Strength: deferred Skin: warm and dry Neurologic: Ambulates without difficulty; Sensation intact about the lower extremities Psychological: alert and cooperative; normal mood and affect  DIAGNOSTIC STUDIES:  DG Foot Complete Left  Result Date: 11/30/2020 CLINICAL DATA:  Blunt trauma to the third through fifth toes with pain, initial encounter EXAM: LEFT FOOT - COMPLETE 3+ VIEW COMPARISON:  None. FINDINGS: There is no evidence of fracture or dislocation. There is no evidence of arthropathy or other focal bone abnormality. Soft tissues are unremarkable. IMPRESSION: No acute abnormality noted. Electronically Signed   By: Alcide Clever M.D.   On: 11/30/2020 08:47    X-rays negative for bony abnormalities including fracture, or dislocation.  No soft tissue swelling.    I have reviewed the x-rays myself and the radiologist interpretation. I am in agreement with the radiologist interpretation.     ASSESSMENT & PLAN:  1. Left foot pain   2. Toe pain, left   3. Injury of toe on left foot, initial encounter     Meds ordered this encounter  Medications  . meloxicam (MOBIC) 15 MG tablet    Sig: Take 1 tablet (15 mg total) by mouth daily.    Dispense:  30 tablet    Refill:  0    Order Specific Question:   Supervising Provider    Answer:   Eustace Moore [[id number]]   X-ray negative for fracture or dislocation Continue conservative management of rest, ice, and elevation Take mobic as needed for pain relief (may cause abdominal discomfort, ulcers, and GI bleeds avoid taking with other NSAIDs) Follow up with PCP if symptoms persist Return or go to the ER if you have any new or worsening symptoms (fever, chills, chest pain, redness,  swelling, discoloration, coldness, no improvement with medication, etc...)    Reviewed expectations re: course of current medical issues. Questions answered. Outlined signs and symptoms indicating need for more acute intervention. Patient verbalized understanding. After Visit Summary given.    Alvino Chapel [place], PA-C 11/30/20 [phone]

## 2020-11-30 NOTE — Discharge Instructions (Signed)
X-ray negative for fracture or dislocation Continue conservative management of rest, ice, and elevation Take mobic as needed for pain relief (may cause abdominal discomfort, ulcers, and GI bleeds avoid taking with other NSAIDs) Follow up with PCP if symptoms persist Return or go to the ER if you have any new or worsening symptoms (fever, chills, chest pain, redness, swelling, discoloration, coldness, no improvement with medication, etc...)

## 2020-12-13 ENCOUNTER — Ambulatory Visit: Payer: 59 | Admitting: Psychology

## 2021-03-28 ENCOUNTER — Encounter: Payer: 59 | Admitting: Adult Health

## 2021-04-10 ENCOUNTER — Other Ambulatory Visit: Payer: Self-pay

## 2021-04-10 ENCOUNTER — Ambulatory Visit (INDEPENDENT_AMBULATORY_CARE_PROVIDER_SITE_OTHER): Payer: 59 | Admitting: Adult Health

## 2021-04-10 ENCOUNTER — Encounter: Payer: Self-pay | Admitting: Adult Health

## 2021-04-10 VITALS — BP 117/82 | HR 67 | Ht 64.0 in | Wt 250.5 lb

## 2021-04-10 DIAGNOSIS — Z3046 Encounter for surveillance of implantable subdermal contraceptive: Secondary | ICD-10-CM | POA: Diagnosis not present

## 2021-04-10 DIAGNOSIS — Z113 Encounter for screening for infections with a predominantly sexual mode of transmission: Secondary | ICD-10-CM | POA: Diagnosis not present

## 2021-04-10 DIAGNOSIS — Z3202 Encounter for pregnancy test, result negative: Secondary | ICD-10-CM | POA: Diagnosis not present

## 2021-04-10 DIAGNOSIS — Z30011 Encounter for initial prescription of contraceptive pills: Secondary | ICD-10-CM | POA: Diagnosis not present

## 2021-04-10 LAB — POCT URINE PREGNANCY: Preg Test, Ur: NEGATIVE

## 2021-04-10 MED ORDER — NORETHIN ACE-ETH ESTRAD-FE 1-20 MG-MCG PO TABS: 1.0000 | ORAL_TABLET | Freq: Every day | ORAL | 11 refills | Status: DC

## 2021-04-10 NOTE — Progress Notes (Signed)
  Subjective:     Patient ID: Susan Branch, female   DOB: October 25, 1995, 25 y.o.   MRN: [id number]  HPI Susan Branch is a 25 year old black female,single, G0P0, in for nexplanon removal and requests OCs. She had LSIl pap 2021. PCP is Susan Marseilles NP  Review of Systems For nexplanon removal Reviewed past medical,surgical, social and family history. Reviewed medications and allergies.     Objective:   Physical Exam BP 117/82 (BP Location: Right Arm, Patient Position: Sitting, Cuff Size: Normal)   Pulse 67   Ht 5\' 4"  (1.626 m)   Wt 250 lb 8 oz (113.6 kg)   BMI 43.00 kg/m  UPT is negative.Conset signed, time out called. Left arm cleansed with betadine, and injected with 1.5 cc 2% lidocaine and waited til numb.Under sterile technique a #11 blade was used to make small vertical incision, and a curved forceps was used to easily remove rod. Steri strips applied. Pressure dressing applied.      Upstream - 04/10/21 0845       Pregnancy Intention Screening   Does the patient want to become pregnant in the next year? No    Does the patient's partner want to become pregnant in the next year? No    Would the patient like to discuss contraceptive options today? Yes      Contraception Wrap Up   Current Method Hormonal Implant    End Method Oral Contraceptive    Contraception Counseling Provided Yes             Assessment:     1. Screen for STD (sexually transmitted disease) Urine sent for GC/CHL  2. Pregnancy test negative   3. Encounter for Nexplanon removal Use condoms x 4 weeks, keep clean and dry x 24 hours, no heavy lifting, keep steri strips on x 72 hours, Keep pressure dressing on x 24 hours. Follow up prn problems.   4. Encounter for initial prescription of contraceptive pills Will rx junel 1/20 start today, use condoms for 1 pack at least Meds ordered this encounter  Medications   norethindrone-ethinyl estradiol-FE (LOESTRIN FE) 1-20 MG-MCG tablet    Sig: Take 1 tablet by  mouth daily.    Dispense:  28 tablet    Refill:  11    Order Specific Question:   Supervising Provider    Answer:   2/20 [[id number]]       Plan:     Return in 6 weeks for pap and physical

## 2021-04-10 NOTE — Patient Instructions (Addendum)
Use condoms x 4 weeks, keep clean and dry x 24 hours, no heavy lifting, keep steri strips on x 72 hours, Keep pressure dressing on x 24 hours. Follow up prn problems.  Thank you for choosing our office today! We appreciate the opportunity to meet your healthcare needs. You may receive a short survey by e-mail or through MyChart. If you are happy with your care we would appreciate if you could take just a few minutes to complete the survey questions. We read all of your comments and take your feedback very seriously. Thank you again for choosing our office. [facility] for [employer]

## 2021-04-13 LAB — GC/CHLAMYDIA PROBE AMP
Chlamydia trachomatis, NAA: NEGATIVE
Neisseria Gonorrhoeae by PCR: NEGATIVE

## 2021-05-23 ENCOUNTER — Other Ambulatory Visit: Payer: 59 | Admitting: Adult Health

## 2021-12-20 ENCOUNTER — Ambulatory Visit (INDEPENDENT_AMBULATORY_CARE_PROVIDER_SITE_OTHER): Payer: 59 | Admitting: *Deleted

## 2021-12-20 DIAGNOSIS — Z3201 Encounter for pregnancy test, result positive: Secondary | ICD-10-CM

## 2021-12-20 DIAGNOSIS — Z789 Other specified health status: Secondary | ICD-10-CM

## 2021-12-20 DIAGNOSIS — N926 Irregular menstruation, unspecified: Secondary | ICD-10-CM

## 2021-12-20 LAB — POCT URINE PREGNANCY: Preg Test, Ur: POSITIVE — AB

## 2021-12-20 NOTE — Progress Notes (Signed)
? ?  NURSE VISIT- PREGNANCY CONFIRMATION  ? ?SUBJECTIVE:  ?Susan Branch is a 26 y.o. G1P0000 female at Unknown by  UNKNOWN LMP  of No LMP recorded (lmp unknown). Patient is pregnant. Here for pregnancy confirmation.  Home pregnancy test: positive x 3   She reports no complaints.  She is taking prenatal vitamins.   ? ?OBJECTIVE:  ?LMP  (LMP Unknown)   ?Appears well, in no apparent distress ? ?Results for orders placed or performed in visit on 12/20/21 (from the past 24 hour(s))  ?POCT urine pregnancy  ? Collection Time: 12/20/21  3:31 PM  ?Result Value Ref Range  ? Preg Test, Ur Positive (A) Negative  ? ? ?ASSESSMENT: ?Positive pregnancy test, Unknown by LMP   ? ?PLAN: ?Schedule for dating ultrasound in 4 weeks ?Prenatal vitamins: plans to begin OTC ASAP   ?Nausea medicines: not currently needed   ?OB packet given: Yes ? ?HCG DRAWN SINCE PATIENT UNSURE OF LMP.  ? ?Susan Branch  ?12/20/2021 ?3:35 PM  ?

## 2021-12-21 LAB — BETA HCG QUANT (REF LAB): hCG Quant: 3693 m[IU]/mL

## 2021-12-26 ENCOUNTER — Encounter (HOSPITAL_COMMUNITY): Payer: Self-pay | Admitting: Obstetrics and Gynecology

## 2021-12-26 ENCOUNTER — Inpatient Hospital Stay (HOSPITAL_COMMUNITY): Payer: 59

## 2021-12-26 ENCOUNTER — Other Ambulatory Visit: Payer: Self-pay

## 2021-12-26 ENCOUNTER — Inpatient Hospital Stay (HOSPITAL_COMMUNITY)
Admission: AD | Admit: 2021-12-26 | Discharge: 2021-12-27 | Disposition: A | Discharge status: Home / Self Care | Payer: 59 | Attending: Obstetrics and Gynecology | Admitting: Obstetrics and Gynecology

## 2021-12-26 DIAGNOSIS — R102 Pelvic and perineal pain: Secondary | ICD-10-CM | POA: Insufficient documentation

## 2021-12-26 DIAGNOSIS — O26891 Other specified pregnancy related conditions, first trimester: Secondary | ICD-10-CM | POA: Diagnosis present

## 2021-12-26 DIAGNOSIS — Z3A01 Less than 8 weeks gestation of pregnancy: Secondary | ICD-10-CM | POA: Diagnosis not present

## 2021-12-26 DIAGNOSIS — B3731 Acute candidiasis of vulva and vagina: Secondary | ICD-10-CM | POA: Insufficient documentation

## 2021-12-26 DIAGNOSIS — O3680X0 Pregnancy with inconclusive fetal viability, not applicable or unspecified: Secondary | ICD-10-CM

## 2021-12-26 DIAGNOSIS — O98811 Other maternal infectious and parasitic diseases complicating pregnancy, first trimester: Secondary | ICD-10-CM | POA: Diagnosis not present

## 2021-12-26 LAB — WET PREP, GENITAL
Sperm: NONE SEEN
Trich, Wet Prep: NONE SEEN
WBC, Wet Prep HPF POC: <10 (ref <10)

## 2021-12-26 LAB — URINALYSIS, ROUTINE W REFLEX MICROSCOPIC
Bilirubin Urine: NEGATIVE
Glucose, UA: NEGATIVE mg/dL
Hgb urine dipstick: NEGATIVE
Ketones, ur: NEGATIVE mg/dL
Leukocytes,Ua: NEGATIVE
Nitrite: NEGATIVE
Protein, ur: NEGATIVE mg/dL
Specific Gravity, Urine: 1.019 (ref 1.005–1.030)
pH: 6 (ref 5.0–8.0)

## 2021-12-26 LAB — ABO/RH: ABO/RH(D): B POS

## 2021-12-26 LAB — CBC
HCT: 38.1 % (ref 36.0–46.0)
Hemoglobin: 13.5 g/dL (ref 12.0–15.0)
MCH: 30 pg (ref 26.0–34.0)
MCHC: 35.4 g/dL (ref 30.0–36.0)
MCV: 84.7 fL (ref 80.0–100.0)
Platelets: 375 10*3/uL (ref 150–400)
RBC: 4.5 MIL/uL (ref 3.87–5.11)
RDW: 12.6 % (ref 11.5–15.5)
WBC: 14.4 10*3/uL — ABNORMAL HIGH (ref 4.0–10.5)
nRBC: 0 % (ref 0.0–0.2)

## 2021-12-26 MED ORDER — TERCONAZOLE 0.4 % VA CREA: 1.0000 | TOPICAL_CREAM | Freq: Every day | VAGINAL | 0 refills | Status: DC

## 2021-12-26 NOTE — MAU Note (Signed)
.  Susan Branch is a 26 y.o. at Unknown here in MAU reporting: having abd cramping on and off for 3-4 weeks. Denies any vag bleeding . Reports some white vag discharge.  C/o headache as well. ?LMP: 11/02/21(aprox) ?Onset of complaint: 3-4 weeks ?Pain score: 5 ?Vitals:  ? 12/26/21 2224  ?BP: 128/74  ?Pulse: (!) 111  ?Resp: 18  ?Temp: 98.7 ?F (37.1 ?C)  ?   ?FHT:n/a ?Lab orders placed from triage:   u/a ?

## 2021-12-26 NOTE — MAU Provider Note (Signed)
Chief Complaint: Abdominal Pain ? ? Event Date/Time  ? First Provider Initiated Contact with Patient 12/26/21 2239   ?  ?   ?SUBJECTIVE ?HPI: Susan Branch is a 26 y.o. G1P0000 at 102w5d by LMP who presents to maternity admissions reporting pelvic cramping for 3 weeks.  States nothing changed tonight, but her mother told her to come in.  Denies bleeding.  Marland Kitchen ?She denies vaginal bleeding, vaginal itching/burning, urinary symptoms, h/a, dizziness, n/v, or fever/chills.   ? ?Abdominal Pain ?This is a recurrent problem. The current episode started 1 to 4 weeks ago. The onset quality is gradual. The problem occurs intermittently. The problem has been unchanged. The pain is located in the LLQ, RLQ and suprapubic region. The pain is moderate. The quality of the pain is cramping. The abdominal pain does not radiate. Associated symptoms include headaches (history of migraines). Pertinent negatives include no anorexia, constipation, diarrhea, dysuria, fever, frequency, myalgias, nausea or vomiting.  ?RN Note: ?Susan Branch is a 26 y.o. at Unknown here in MAU reporting: having abd cramping on and off for 3-4 weeks. Denies any vag bleeding . Reports some white vag discharge.  C/o headache as well. ?LMP: 11/02/21(aprox) ?Onset of complaint: 3-4 weeks ?Pain score: 5 ? ?Past Medical History:  ?Diagnosis Date  ? Allergy   ? Anxiety   ? Borderline hypertension   ? no current med.  ? Headache   ? Tonsillar and adenoid hypertrophy 05/2013  ? snores during sleep, mother denies apnea  ? ?Past Surgical History:  ?Procedure Laterality Date  ? TONSILLECTOMY AND ADENOIDECTOMY N/A 06/02/2013  ? Procedure: TONSILLECTOMY AND ADENOIDECTOMY;  Surgeon: [name], MD;  Location: [facility];  Service: ENT;  Laterality: N/A;  ? TURBINATE REDUCTION Bilateral 06/02/2013  ? Procedure: TURBINATE REDUCTION BILATERAL;  Surgeon: [name], MD;  Location: [facility];  Service: ENT;  Laterality: Bilateral;  ? ?Social History   ? ?Socioeconomic History  ? Marital status: Single  ?  Spouse name: Not on file  ? Number of children: Not on file  ? Years of education: Not on file  ? Highest education level: Not on file  ?Occupational History  ? Not on file  ?Tobacco Use  ? Smoking status: Never  ?  Passive exposure: Yes  ? Smokeless tobacco: Never  ? Tobacco comments:  ?  father smokes outside  ?Vaping Use  ? Vaping Use: Never used  ?Substance and Sexual Activity  ? Alcohol use: No  ? Drug use: Yes  ?  Types: Marijuana  ? Sexual activity: Yes  ?  Birth control/protection: Pill, Condom  ?Other Topics Concern  ? Not on file  ?Social History Narrative  ? Not on file  ? ?Social Determinants of Health  ? ?Financial Resource Strain: Not on file  ?Food Insecurity: Not on file  ?Transportation Needs: Not on file  ?Physical Activity: Not on file  ?Stress: Not on file  ?Social Connections: Not on file  ?Intimate Partner Violence: Not on file  ? ?No current facility-administered medications on file prior to encounter.  ? ?Current Outpatient Medications on File Prior to Encounter  ?Medication Sig Dispense Refill  ? [DISCONTINUED] cetirizine (ZYRTEC ALLERGY) 10 MG tablet Take 1 tablet (10 mg total) by mouth daily. 30 tablet 0  ? [DISCONTINUED] fluticasone (FLONASE) 50 MCG/ACT nasal spray Place 1 spray into both nostrils daily for 14 days. 16 g 0  ? [DISCONTINUED] norgestimate-ethinyl estradiol (SPRINTEC 28) 0.25-35 MG-MCG tablet Take 1  tablet by mouth daily. 1 Package 1  ? ?Allergies  ?Allergen Reactions  ? Adhesive [Tape] Rash  ? Latex Rash  ? Soap Rash  ? ? ?I have reviewed patient's Past Medical Hx, Surgical Hx, Family Hx, Social Hx, medications and allergies.  ? ?ROS:  ?Review of Systems  ?Constitutional:  Negative for fever.  ?Gastrointestinal:  Positive for abdominal pain. Negative for anorexia, constipation, diarrhea, nausea and vomiting.  ?Genitourinary:  Negative for dysuria and frequency.  ?Musculoskeletal:  Negative for myalgias.  ?Neurological:   Positive for headaches (history of migraines).  ?Review of Systems  ?Other systems negative ? ? ?Physical Exam  ?Physical Exam ?Patient Vitals for the past 24 hrs: ? BP Temp Pulse Resp Height Weight  ?12/26/21 2224 128/74 98.7 ?F (37.1 ?C) (!) 111 18 5\' 6"  (1.676 m) 106.1 kg  ? ?Constitutional: Well-developed, well-nourished female in no acute distress.  ?Cardiovascular: normal rate ?Respiratory: normal effort ?GI: Abd soft, non-tender.  ?MS: Extremities nontender, no edema, normal ROM ?Neurologic: Alert and oriented x 4.  ?GU: Neg CVAT. ? ?PELVIC EXAM: Deferred in lieu of ultrasound.  We did collect cultures. ? ?LAB RESULTS ?Results for orders placed or performed during the hospital encounter of 12/26/21 (from the past 24 hour(s))  ?Urinalysis, Routine w reflex microscopic Urine, Clean Catch     Status: Abnormal  ? Collection Time: 12/26/21 10:29 PM  ?Result Value Ref Range  ? Color, Urine YELLOW YELLOW  ? APPearance HAZY (A) CLEAR  ? Specific Gravity, Urine 1.019 1.005 - 1.030  ? pH 6.0 5.0 - 8.0  ? Glucose, UA NEGATIVE NEGATIVE mg/dL  ? Hgb urine dipstick NEGATIVE NEGATIVE  ? Bilirubin Urine NEGATIVE NEGATIVE  ? Ketones, ur NEGATIVE NEGATIVE mg/dL  ? Protein, ur NEGATIVE NEGATIVE mg/dL  ? Nitrite NEGATIVE NEGATIVE  ? Leukocytes,Ua NEGATIVE NEGATIVE  ?Wet prep, genital     Status: Abnormal  ? Collection Time: 12/26/21 10:51 PM  ?Result Value Ref Range  ? Yeast Wet Prep HPF POC PRESENT (A) NONE SEEN  ? Trich, Wet Prep NONE SEEN NONE SEEN  ? Clue Cells Wet Prep HPF POC PRESENT (A) NONE SEEN  ? WBC, Wet Prep HPF POC <10 <10  ? Sperm NONE SEEN   ?CBC     Status: Abnormal  ? Collection Time: 12/26/21 10:58 PM  ?Result Value Ref Range  ? WBC 14.4 (H) 4.0 - 10.5 K/uL  ? RBC 4.50 3.87 - 5.11 MIL/uL  ? Hemoglobin 13.5 12.0 - 15.0 g/dL  ? HCT 38.1 36.0 - 46.0 %  ? MCV 84.7 80.0 - 100.0 fL  ? MCH 30.0 26.0 - 34.0 pg  ? MCHC 35.4 30.0 - 36.0 g/dL  ? RDW 12.6 11.5 - 15.5 %  ? Platelets 375 150 - 400 K/uL  ? nRBC 0.0 0.0 - 0.2  %  ?hCG, quantitative, pregnancy     Status: Abnormal  ? Collection Time: 12/26/21 10:58 PM  ?Result Value Ref Range  ? hCG, Beta Chain, Quant, S 28,518 (H) <5 mIU/mL  ?ABO/Rh     Status: None  ? Collection Time: 12/26/21 10:58 PM  ?Result Value Ref Range  ? ABO/RH(D) B POS   ? No rh immune globuloin    ?  NOT A RH IMMUNE GLOBULIN CANDIDATE, PT RH POSITIVE ?Performed at [facility], [address] [place], [place] [id number] ?  ? ?  ? ?IMAGING ?US OB LESS THAN 14 WEEKS WITH OB TRANSVAGINAL ? ?Result Date: 12/26/2021 ?CLINICAL DATA:  Abdominal cramping. EXAM: OBSTETRIC <14 WK [place] AND TRANSVAGINAL OB US TECHNIQUE: Both transabdominal and transvaginal ultrasound examinations were performed for complete evaluation of the gestation as well as the maternal uterus, adnexal regions, and pelvic cul-de-sac. Transvaginal technique was performed to assess early pregnancy. COMPARISON:  None. FINDINGS: Intrauterine gestational sac: Single Yolk sac:  Visualized. Embryo:  Visualized. Cardiac Activity: Visualized. Heart Rate: 104 bpm CRL:  3.3 mm   5 w   6 d                  [place] EDC: 08/22/2022 Subchorionic hemorrhage:  None visualized. Maternal uterus/adnexae: The ovaries appear within normal limits. No pelvic free fluid. IMPRESSION: 1. Single live intrauterine gestation measuring 5 weeks 6 days by crown-rump length. Electronically Signed   By: Darliss Cheney M.D.   On: 12/26/2021 23:45   ? ? ?MAU Management/MDM: ?Ordered usual first trimester r/o ectopic labs.   ?Pelvic cultures done ?Will check baseline Ultrasound to rule out ectopic. ? ?This bleeding/pain can represent a normal pregnancy with bleeding, spontaneous abortion or even an ectopic which can be life-threatening.  The process as listed above helps to determine which of these is present. ? ?Reviewed findings which confirm pregnancy is intrauterine ?Will send Rx for yeast vaginitis.  Discussed prenatal care, pt has arranged ? ?ASSESSMENT ?Pregnancy at [redacted]w[redacted]d ?Single  intrauterine pregnancy ?Pelvic cramping, unclear source.  ?Yeast vaginitis ? ?PLAN ?Discharge home ?Rx Terazol for yeast ?Encouraged to start prenatal care ? ?Pt stable at time of discharge. ?Encouraged to r

## 2021-12-27 DIAGNOSIS — R102 Pelvic and perineal pain: Secondary | ICD-10-CM

## 2021-12-27 DIAGNOSIS — Z3A01 Less than 8 weeks gestation of pregnancy: Secondary | ICD-10-CM

## 2021-12-27 DIAGNOSIS — O26891 Other specified pregnancy related conditions, first trimester: Secondary | ICD-10-CM

## 2021-12-27 LAB — GC/CHLAMYDIA PROBE AMP (~~LOC~~) NOT AT ARMC
Chlamydia: NEGATIVE
Comment: NEGATIVE
Comment: NORMAL
Neisseria Gonorrhea: NEGATIVE

## 2021-12-27 LAB — HCG, QUANTITATIVE, PREGNANCY: hCG, Beta Chain, Quant, S: 28518 m[IU]/mL — ABNORMAL HIGH (ref <5)

## 2022-01-16 ENCOUNTER — Other Ambulatory Visit: Payer: Self-pay | Admitting: Obstetrics & Gynecology

## 2022-01-16 DIAGNOSIS — O3680X0 Pregnancy with inconclusive fetal viability, not applicable or unspecified: Secondary | ICD-10-CM

## 2022-01-17 ENCOUNTER — Ambulatory Visit: Payer: 59

## 2022-01-28 ENCOUNTER — Inpatient Hospital Stay (HOSPITAL_COMMUNITY)
Admission: AD | Admit: 2022-01-28 | Discharge: 2022-01-28 | Disposition: A | Discharge status: Home / Self Care | Payer: 59 | Attending: Obstetrics & Gynecology | Admitting: Obstetrics & Gynecology

## 2022-01-28 ENCOUNTER — Encounter (HOSPITAL_COMMUNITY): Payer: Self-pay | Admitting: Obstetrics & Gynecology

## 2022-01-28 DIAGNOSIS — Z79899 Other long term (current) drug therapy: Secondary | ICD-10-CM | POA: Insufficient documentation

## 2022-01-28 DIAGNOSIS — Z3A10 10 weeks gestation of pregnancy: Secondary | ICD-10-CM | POA: Diagnosis not present

## 2022-01-28 DIAGNOSIS — O26891 Other specified pregnancy related conditions, first trimester: Secondary | ICD-10-CM | POA: Diagnosis not present

## 2022-01-28 DIAGNOSIS — O209 Hemorrhage in early pregnancy, unspecified: Secondary | ICD-10-CM | POA: Insufficient documentation

## 2022-01-28 DIAGNOSIS — O169 Unspecified maternal hypertension, unspecified trimester: Secondary | ICD-10-CM

## 2022-01-28 DIAGNOSIS — R030 Elevated blood-pressure reading, without diagnosis of hypertension: Secondary | ICD-10-CM | POA: Insufficient documentation

## 2022-01-28 MED ORDER — PRENATAL COMPLETE 14-0.4 MG PO TABS: 1.0000 | ORAL_TABLET | Freq: Every day | ORAL | 6 refills | Status: AC

## 2022-01-28 NOTE — MAU Note (Addendum)
Susan Branch is a 26 y.o. at [redacted]w[redacted]d here in MAU reporting: bleeding when she wiped after using the bathroom. Just noticed within the last hour. Not wearing a pad.  Also concerned about a bug bite on her arm that seems to be spreading. LMP: 11/02/21 Onset of complaint: today Pain score: 0/10 Vitals:   01/28/22 0029  BP: 130/86  Pulse: 81  Resp: 18  Temp: 98.1 F (36.7 C)  SpO2: 100%     FHT: Lab orders placed from triage: Urinalysis

## 2022-01-28 NOTE — MAU Provider Note (Signed)
History     [id number]  Arrival date and time: 01/28/22 0018    Chief Complaint  Patient presents with   Vaginal Bleeding     HPI 26yo G1P0@ [redacted]w[redacted]d by LMP who presents with concerns about vaginal bleeding.  She noted episode of pink bleeding- not heavy, but more than just spotting x1..  She noted this when she went to the bathroom and since states it has started to improve to just pink spotting.  Denies pelvic pain.  Had intercourse last night.  Denies nausea/vomiting.  Denies urinary concerns.  Notes slight headache, but states she has a h/o migraines.  Reports no other acute complaints.  She just wanted to make sure the baby was ok  OB History     Gravida  1   Para  0   Term  0   Preterm  0   AB  0   Living  0      SAB  0   IAB  0   Ectopic  0   Multiple  0   Live Births  0           Past Medical History:  Diagnosis Date   Allergy    Anxiety    Borderline hypertension    no current med.   Headache    Tonsillar and adenoid hypertrophy 05/2013   snores during sleep, mother denies apnea    Past Surgical History:  Procedure Laterality Date   TONSILLECTOMY AND ADENOIDECTOMY N/A 06/02/2013   Procedure: TONSILLECTOMY AND ADENOIDECTOMY;  Surgeon: [name], MD;  Location: [facility];  Service: ENT;  Laterality: N/A;   TURBINATE REDUCTION Bilateral 06/02/2013   Procedure: TURBINATE REDUCTION BILATERAL;  Surgeon: [name], MD;  Location: [facility];  Service: ENT;  Laterality: Bilateral;    Family History  Problem Relation Age of Onset   Diabetes Mother    Diabetes Father    Hypertension Father    Heart disease Father        MI   Hyperlipidemia Father    Kidney disease Father    Vision loss Father    Vision loss Maternal Grandmother     Allergies  Allergen Reactions   Adhesive [Tape] Rash   Latex Rash   Soap Rash    No current facility-administered medications on file prior to encounter.   Current Outpatient  Medications on File Prior to Encounter  Medication Sig Dispense Refill   Prenatal Vit-Fe Fumarate-FA (PRENATAL VITAMIN PO) Take 1 tablet by mouth.     terconazole (TERAZOL 7) 0.4 % vaginal cream Place 1 applicator vaginally at bedtime. 45 g 0   [DISCONTINUED] cetirizine (ZYRTEC ALLERGY) 10 MG tablet Take 1 tablet (10 mg total) by mouth daily. 30 tablet 0   [DISCONTINUED] fluticasone (FLONASE) 50 MCG/ACT nasal spray Place 1 spray into both nostrils daily for 14 days. 16 g 0   [DISCONTINUED] norgestimate-ethinyl estradiol (SPRINTEC 28) 0.25-35 MG-MCG tablet Take 1 tablet by mouth daily. 1 Package 1     Review of Systems  Constitutional:  Negative for chills, fever and malaise/fatigue.  Respiratory: Negative.    Cardiovascular: Negative.   Gastrointestinal:  Negative for abdominal pain, heartburn, nausea and vomiting.  Genitourinary:  Negative for dysuria, frequency and urgency.  Musculoskeletal: Negative.   Skin: Negative.   Neurological:  Positive for headaches. Negative for dizziness and weakness.  Psychiatric/Behavioral: Negative.    Pertinent positives and negative per HPI, all others reviewed and negative  Physical Exam   BP (!) 141/80 (BP Location: Right Arm)   Pulse 75   Temp 98.2 F (36.8 C) (Oral)   Resp 17   Ht 5\' 6"  (1.676 m)   Wt 105.2 kg   LMP 11/02/2021 (Within Weeks)   SpO2 100%   BMI 37.45 kg/m   Patient Vitals for the past 24 hrs:  BP Temp Temp src Pulse Resp SpO2 Height Weight  01/28/22 0054 (!) 141/80 98.2 F (36.8 C) Oral 75 17 100 % -- --  01/28/22 0029 130/86 98.1 F (36.7 C) Oral 81 18 100 % 5\' 6"  (1.676 m) 105.2 kg    Physical Exam Vitals reviewed.  Constitutional:      Appearance: Normal appearance.  Cardiovascular:     Rate and Rhythm: Normal rate and regular rhythm.  Pulmonary:     Effort: Pulmonary effort is normal. No respiratory distress.     Breath sounds: Normal breath sounds. No wheezing.  Abdominal:     General: There is no  distension.     Palpations: Abdomen is soft.     Tenderness: There is no abdominal tenderness.  Genitourinary:    Comments: Normal external genitialia, vagina pink moist mucosa, no abnormal discharge, minimal light pink blood noted in vaginal vault- less than 5cc, cervix visualized- closed, no active bleeding noted.  On bimanual exam: cervix closed Skin:    General: Skin is warm and dry.  Neurological:     Mental Status: She is alert and oriented to person, place, and time.  Psychiatric:        Mood and Affect: Mood normal.        Behavior: Behavior normal.     Bedside Ultrasound Viable IUP    MAU Course  Procedures Lab Orders  No laboratory test(s) ordered today   Meds ordered this encounter  Medications   Prenatal Vit-Fe Fumarate-FA (PRENATAL COMPLETE) 14-0.4 MG TABS    Sig: Take 1 tablet by mouth daily.    Dispense:  60 tablet    Refill:  6   Imaging Orders  No imaging studies ordered today    MDM -history and exam completed -bedside 01/30/22 confirmed viable IUP Assessment and Plan   1. Vaginal bleeding in pregnancy, first trimester   2. [redacted] weeks gestation of pregnancy   3. Elevated blood pressure affecting pregnancy, antepartum    -reassured pt that spotting likely due to recent intercourse -advised follow up at [facility] appointment  -Elevated BP noted x 1, plan to follow in office -reviewed 1st trimester precautions -questions and concerns were addressed  [place], DO 01/28/22 10:36 AM

## 2022-02-13 ENCOUNTER — Other Ambulatory Visit: Payer: Self-pay | Admitting: Obstetrics & Gynecology

## 2022-02-13 DIAGNOSIS — Z3682 Encounter for antenatal screening for nuchal translucency: Secondary | ICD-10-CM

## 2022-02-13 DIAGNOSIS — Z3400 Encounter for supervision of normal first pregnancy, unspecified trimester: Secondary | ICD-10-CM | POA: Insufficient documentation

## 2022-02-14 ENCOUNTER — Other Ambulatory Visit (HOSPITAL_COMMUNITY)
Admission: RE | Admit: 2022-02-14 | Discharge: 2022-02-14 | Disposition: A | Discharge status: Home / Self Care | Payer: 59 | Source: Ambulatory Visit | Attending: Advanced Practice Midwife | Admitting: Advanced Practice Midwife

## 2022-02-14 ENCOUNTER — Ambulatory Visit (INDEPENDENT_AMBULATORY_CARE_PROVIDER_SITE_OTHER): Payer: 59 | Admitting: Advanced Practice Midwife

## 2022-02-14 ENCOUNTER — Encounter: Payer: Self-pay | Admitting: Advanced Practice Midwife

## 2022-02-14 ENCOUNTER — Ambulatory Visit: Payer: 59 | Admitting: *Deleted

## 2022-02-14 ENCOUNTER — Ambulatory Visit (INDEPENDENT_AMBULATORY_CARE_PROVIDER_SITE_OTHER): Payer: 59

## 2022-02-14 VITALS — BP 124/82 | HR 80 | Wt 227.0 lb

## 2022-02-14 DIAGNOSIS — Z124 Encounter for screening for malignant neoplasm of cervix: Secondary | ICD-10-CM | POA: Insufficient documentation

## 2022-02-14 DIAGNOSIS — Z3A13 13 weeks gestation of pregnancy: Secondary | ICD-10-CM

## 2022-02-14 DIAGNOSIS — Z3401 Encounter for supervision of normal first pregnancy, first trimester: Secondary | ICD-10-CM | POA: Insufficient documentation

## 2022-02-14 DIAGNOSIS — Z3682 Encounter for antenatal screening for nuchal translucency: Secondary | ICD-10-CM | POA: Diagnosis not present

## 2022-02-14 DIAGNOSIS — Z113 Encounter for screening for infections with a predominantly sexual mode of transmission: Secondary | ICD-10-CM

## 2022-02-14 DIAGNOSIS — Z6838 Body mass index (BMI) 38.0-38.9, adult: Secondary | ICD-10-CM

## 2022-02-14 DIAGNOSIS — Z363 Encounter for antenatal screening for malformations: Secondary | ICD-10-CM

## 2022-02-14 DIAGNOSIS — R030 Elevated blood-pressure reading, without diagnosis of hypertension: Secondary | ICD-10-CM

## 2022-02-14 LAB — POCT URINALYSIS DIPSTICK OB
Blood, UA: NEGATIVE
Glucose, UA: NEGATIVE
Ketones, UA: NEGATIVE
Leukocytes, UA: NEGATIVE
Nitrite, UA: NEGATIVE
POC,PROTEIN,UA: NEGATIVE

## 2022-02-14 LAB — OB RESULTS CONSOLE GBS: GBS: POSITIVE

## 2022-02-14 MED ORDER — ASPIRIN 81 MG PO CHEW: 162.0000 mg | CHEWABLE_TABLET | Freq: Every day | ORAL | 7 refills | Status: DC

## 2022-02-14 NOTE — Progress Notes (Signed)
INITIAL OBSTETRICAL VISIT Patient name: Susan Branch MRN [id number]  Date of birth: [birth year] Chief Complaint:   Initial Prenatal Visit  History of Present Illness:   Susan Branch is a 26 y.o. G82P0000 African-American female at [redacted]w[redacted]d by [place] at 5.6 weeks with an Estimated Date of Delivery: 08/22/22 being seen today for her initial obstetrical visit.   Patient's last menstrual period was 11/02/2021 (within weeks). Her obstetrical history is significant for primigravida.   Today she reports  some nausea still but is improving .  Last pap Feb 2021. Results were: LSIL w/ HRHPV not done     02/14/2022    9:47 AM 05/04/2020   10:12 AM 11/04/2019   12:08 PM 04/18/2018    9:08 AM 09/25/2017   10:30 AM  Depression screen PHQ 2/9  Decreased Interest 1 2 1 1 3   Down, Depressed, Hopeless 1 2 1 1 3   PHQ - 2 Score 2 4 2 2 6   Altered sleeping 1 2 1  0 3  Tired, decreased energy 1 2 1  0 3  Change in appetite 2 0 1 0 3  Feeling bad or failure about yourself  1 2 1  0 3  Trouble concentrating 1 2 1  0 3  Moving slowly or fidgety/restless 1 2 1  0 1  Suicidal thoughts 0 0 0 0 2  PHQ-9 Score 9 14 8 2 24   Difficult doing work/chores   Somewhat difficult Not difficult at all Very difficult        02/14/2022   10:04 AM  GAD 7 : Generalized Anxiety Score  Nervous, Anxious, on Edge 2  Control/stop worrying 1  Worry too much - different things 2  Trouble relaxing 1  Restless 1  Easily annoyed or irritable 1  Afraid - awful might happen 1  Total GAD 7 Score 9     Review of Systems:   Pertinent items are noted in HPI Denies cramping/contractions, leakage of fluid, vaginal bleeding, abnormal vaginal discharge w/ itching/odor/irritation, headaches, visual changes, shortness of breath, chest pain, abdominal pain, severe nausea/vomiting, or problems with urination or bowel movements unless otherwise stated above.  Pertinent History Reviewed:  Reviewed past medical,surgical, social, obstetrical and  family history.  Reviewed problem list, medications and allergies. OB History  Gravida Para Term Preterm AB Living  1 0 0 0 0 0  SAB IAB Ectopic Multiple Live Births  0 0 0 0 0    # Outcome Date GA Lbr Len/2nd Weight Sex Delivery Anes PTL Lv  1 Current            Physical Assessment:   Vitals:   02/14/22 0908  BP: 124/82  Pulse: 80  Weight: 227 lb (103 kg)  Body mass index is 36.64 kg/m.       Physical Examination:  General appearance - well appearing, and in no distress  Mental status - alert, oriented to person, place, and time  Psych:  She has a normal mood and affect  Skin - warm and dry, normal color, no suspicious lesions noted  Chest - effort normal, all lung fields clear to auscultation bilaterally  Heart - normal rate and regular rhythm  Abdomen - soft, nontender  Extremities:  No swelling or varicosities noted  Pelvic - VULVA: normal appearing vulva with no masses, tenderness or lesions  VAGINA: normal appearing vagina with normal color and discharge, no lesions  CERVIX: normal appearing cervix without discharge or lesions, no CMT  Thin prep pap is  done without HR HPV cotesting  Chaperone: Latisha Cresenzo    TODAY'S NT [place] 13 wks,measurements c/w dates,FHR 149 bpm,NB present,NT 1.7 mm,CRL 71.31 mm,normal ovaries  No results found for this or any previous visit (from the past 24 hour(s)).  Assessment & Plan:  1) Low-Risk Pregnancy G1P0000 at [redacted]w[redacted]d with an Estimated Date of Delivery: 08/22/22   2) Initial OB visit  3) Anxiety, no meds currently  4) Mild range BP elevation in MAU 5/21, states she had a migraine at the time, but will get baseline pre-e labs and start bASA  Meds:  Meds ordered this encounter  Medications   aspirin 81 MG chewable tablet    Sig: Chew 2 tablets (162 mg total) by mouth daily.    Dispense:  60 tablet    Refill:  7    Order Specific Question:   Supervising Provider    Answer:   Janyth Pupa [id number]    Initial labs  obtained Continue prenatal vitamins Reviewed n/v relief measures and warning s/s to report Reviewed recommended weight gain based on pre-gravid BMI Encouraged well-balanced diet Genetic & carrier screening discussed: requests Panorama and NT/IT, requests Horizon  Ultrasound discussed; fetal survey: requested [place] completed> form faxed if has or is planning to apply for medicaid The nature of [place] for [employer] with multiple MDs and other Advanced Practice Providers was explained to patient; also emphasized that fellows, residents, and students are part of our team. Does have home bp cuff. Office bp cuff given: no. Rx sent: n/a. Check bp weekly, let us know if consistently >140/90.   Indications for ASA therapy (per uptodate)  OR Two or more of the following: Nulliparity Yes Obesity (BMI>30 kg/m2) Yes   Indications for early A1C (per uptodate) BMI >=25 (>=23 in Asian women) AND one of the following High-risk race/ethnicity (eg, African American, Latino, Native American, [place] American, [place]) Yes   Follow-up: Return in about 4 weeks (around 03/14/2022) for 4 wk LROB, 2nd IT; then 7wk LROB w anatomy u/s.   Orders Placed This Encounter  Procedures   Urine Culture   US OB Comp + 14 Wk   Integrated 1   Genetic Screening   CBC/D/Plt+RPR+Rh+ABO+RubIgG...   Hemoglobin A1c   Protein / creatinine ratio, urine   Comprehensive metabolic panel   POC Urinalysis Dipstick OB    Myrtis Ser [facility] 02/14/2022 10:21 AM

## 2022-02-14 NOTE — Patient Instructions (Signed)
Bridgette, thank you for choosing our office today! We appreciate the opportunity to meet your healthcare needs. You may receive a short survey by mail, e-mail, or through [employer]. If you are happy with your care we would appreciate if you could take just a few minutes to complete the survey questions. We read all of your comments and take your feedback very seriously. Thank you again for choosing our office.  Center for [employer] [facility] Team at [facility]  [facility] ([address] [place], [place] [address]) Entrance C, located off of [address] Free 24/7 valet parking   Nausea & Vomiting Have saltine crackers or pretzels by your bed and eat a few bites before you raise your head out of bed in the morning Eat small frequent meals throughout the day instead of large meals Drink plenty of fluids throughout the day to stay hydrated, just don't drink a lot of fluids with your meals.  This can make your stomach fill up faster making you feel sick Do not brush your teeth right after you eat Products with real ginger are good for nausea, like ginger ale and ginger hard candy Make sure it says made with real ginger! Sucking on sour candy like lemon heads is also good for nausea If your prenatal vitamins make you nauseated, take them at night so you will sleep through the nausea Sea Bands If you feel like you need medicine for the nausea & vomiting please let us know If you are unable to keep any fluids or food down please let us know   Constipation Drink plenty of fluid, preferably water, throughout the day Eat foods high in fiber such as fruits, vegetables, and grains Exercise, such as walking, is a good way to keep your bowels regular Drink warm fluids, especially warm prune juice, or decaf coffee Eat a 1/2 cup of real oatmeal (not instant), 1/2 cup applesauce, and 1/2-1 cup warm prune juice every day If needed, you may take Colace (docusate sodium) stool softener  once or twice a day to help keep the stool soft.  If you still are having problems with constipation, you may take Miralax once daily as needed to help keep your bowels regular.   Home Blood Pressure Monitoring for Patients   Your provider has recommended that you check your blood pressure (BP) at least once a week at home. If you do not have a blood pressure cuff at home, one will be provided for you. Contact your provider if you have not received your monitor within 1 week.   Helpful Tips for Accurate Home Blood Pressure Checks  Don't smoke, exercise, or drink caffeine 30 minutes before checking your BP Use the restroom before checking your BP (a full bladder can raise your pressure) Relax in a comfortable upright chair Feet on the ground Left arm resting comfortably on a flat surface at the level of your heart Legs uncrossed Back supported Sit quietly and don't talk Place the cuff on your bare arm Adjust snuggly, so that only two fingertips can fit between your skin and the top of the cuff Check 2 readings separated by at least one minute Keep a log of your BP readings For a visual, please reference this diagram: http://ccnc.care/bpdiagram  Provider Name: [facility]     Phone: [phone]  Zone 1: ALL CLEAR  Continue to monitor your symptoms:  BP reading is less than 140 (top number) or less than 90 (bottom  number)  No right upper stomach pain No headaches or seeing spots No feeling nauseated or throwing up No swelling in face and hands  Zone 2: CAUTION Call your doctor's office for any of the following:  BP reading is greater than 140 (top number) or greater than 90 (bottom number)  Stomach pain under your ribs in the middle or right side Headaches or seeing spots Feeling nauseated or throwing up Swelling in face and hands  Zone 3: EMERGENCY  Seek immediate medical care if you have any of the following:  BP reading is greater than160 (top number) or greater than  110 (bottom number) Severe headaches not improving with Tylenol Serious difficulty catching your breath Any worsening symptoms from Zone 2    First Trimester of Pregnancy The first trimester of pregnancy is from week 1 until the end of week 12 (months 1 through 3). A week after a sperm fertilizes an egg, the egg will implant on the wall of the uterus. This embryo will begin to develop into a baby. Genes from you and your partner are forming the baby. The female genes determine whether the baby is a boy or a girl. At 6-8 weeks, the eyes and face are formed, and the heartbeat can be seen on ultrasound. At the end of 12 weeks, all the baby's organs are formed.  Now that you are pregnant, you will want to do everything you can to have a healthy baby. Two of the most important things are to get good prenatal care and to follow your health care provider's instructions. Prenatal care is all the medical care you receive before the baby's birth. This care will help prevent, find, and treat any problems during the pregnancy and childbirth. BODY CHANGES Your body goes through many changes during pregnancy. The changes vary from woman to woman.  You may gain or lose a couple of pounds at first. You may feel sick to your stomach (nauseous) and throw up (vomit). If the vomiting is uncontrollable, call your health care provider. You may tire easily. You may develop headaches that can be relieved by medicines approved by your health care provider. You may urinate more often. Painful urination may mean you have a bladder infection. You may develop heartburn as a result of your pregnancy. You may develop constipation because certain hormones are causing the muscles that push waste through your intestines to slow down. You may develop hemorrhoids or swollen, bulging veins (varicose veins). Your breasts may begin to grow larger and become tender. Your nipples may stick out more, and the tissue that surrounds them  (areola) may become darker. Your gums may bleed and may be sensitive to brushing and flossing. Dark spots or blotches (chloasma, mask of pregnancy) may develop on your face. This will likely fade after the baby is born. Your menstrual periods will stop. You may have a loss of appetite. You may develop cravings for certain kinds of food. You may have changes in your emotions from day to day, such as being excited to be pregnant or being concerned that something may go wrong with the pregnancy and baby. You may have more vivid and strange dreams. You may have changes in your hair. These can include thickening of your hair, rapid growth, and changes in texture. Some women also have hair loss during or after pregnancy, or hair that feels dry or thin. Your hair will most likely return to normal after your baby is born. WHAT TO EXPECT AT YOUR PRENATAL  VISITS During a routine prenatal visit: You will be weighed to make sure you and the baby are growing normally. Your blood pressure will be taken. Your abdomen will be measured to track your baby's growth. The fetal heartbeat will be listened to starting around week 10 or 12 of your pregnancy. Test results from any previous visits will be discussed. Your health care provider may ask you: How you are feeling. If you are feeling the baby move. If you have had any abnormal symptoms, such as leaking fluid, bleeding, severe headaches, or abdominal cramping. If you have any questions. Other tests that may be performed during your first trimester include: Blood tests to find your blood type and to check for the presence of any previous infections. They will also be used to check for low iron levels (anemia) and Rh antibodies. Later in the pregnancy, blood tests for diabetes will be done along with other tests if problems develop. Urine tests to check for infections, diabetes, or protein in the urine. An ultrasound to confirm the proper growth and development  of the baby. An amniocentesis to check for possible genetic problems. Fetal screens for spina bifida and Down syndrome. You may need other tests to make sure you and the baby are doing well. HOME CARE INSTRUCTIONS  Medicines Follow your health care provider's instructions regarding medicine use. Specific medicines may be either safe or unsafe to take during pregnancy. Take your prenatal vitamins as directed. If you develop constipation, try taking a stool softener if your health care provider approves. Diet Eat regular, well-balanced meals. Choose a variety of foods, such as meat or vegetable-based protein, fish, milk and low-fat dairy products, vegetables, fruits, and whole grain breads and cereals. Your health care provider will help you determine the amount of weight gain that is right for you. Avoid raw meat and uncooked cheese. These carry germs that can cause birth defects in the baby. Eating four or five small meals rather than three large meals a day may help relieve nausea and vomiting. If you start to feel nauseous, eating a few soda crackers can be helpful. Drinking liquids between meals instead of during meals also seems to help nausea and vomiting. If you develop constipation, eat more high-fiber foods, such as fresh vegetables or fruit and whole grains. Drink enough fluids to keep your urine clear or pale yellow. Activity and Exercise Exercise only as directed by your health care provider. Exercising will help you: Control your weight. Stay in shape. Be prepared for labor and delivery. Experiencing pain or cramping in the lower abdomen or low back is a good sign that you should stop exercising. Check with your health care provider before continuing normal exercises. Try to avoid standing for long periods of time. Move your legs often if you must stand in one place for a long time. Avoid heavy lifting. Wear low-heeled shoes, and practice good posture. You may continue to have sex  unless your health care provider directs you otherwise. Relief of Pain or Discomfort Wear a good support bra for breast tenderness.   Take warm sitz baths to soothe any pain or discomfort caused by hemorrhoids. Use hemorrhoid cream if your health care provider approves.   Rest with your legs elevated if you have leg cramps or low back pain. If you develop varicose veins in your legs, wear support hose. Elevate your feet for 15 minutes, 3-4 times a day. Limit salt in your diet. Prenatal Care Schedule your prenatal visits by the  twelfth week of pregnancy. They are usually scheduled monthly at first, then more often in the last 2 months before delivery. Write down your questions. Take them to your prenatal visits. Keep all your prenatal visits as directed by your health care provider. Safety Wear your seat belt at all times when driving. Make a list of emergency phone numbers, including numbers for family, friends, the hospital, and police and fire departments. General Tips Ask your health care provider for a referral to a local prenatal education class. Begin classes no later than at the beginning of month 6 of your pregnancy. Ask for help if you have counseling or nutritional needs during pregnancy. Your health care provider can offer advice or refer you to specialists for help with various needs. Do not use hot tubs, steam rooms, or saunas. Do not douche or use tampons or scented sanitary pads. Do not cross your legs for long periods of time. Avoid cat litter boxes and soil used by cats. These carry germs that can cause birth defects in the baby and possibly loss of the fetus by miscarriage or stillbirth. Avoid all smoking, herbs, alcohol, and medicines not prescribed by your health care provider. Chemicals in these affect the formation and growth of the baby. Schedule a dentist appointment. At home, brush your teeth with a soft toothbrush and be gentle when you floss. SEEK MEDICAL CARE IF:   You have dizziness. You have mild pelvic cramps, pelvic pressure, or nagging pain in the abdominal area. You have persistent nausea, vomiting, or diarrhea. You have a bad smelling vaginal discharge. You have pain with urination. You notice increased swelling in your face, hands, legs, or ankles. SEEK IMMEDIATE MEDICAL CARE IF:  You have a fever. You are leaking fluid from your vagina. You have spotting or bleeding from your vagina. You have severe abdominal cramping or pain. You have rapid weight gain or loss. You vomit blood or material that looks like coffee grounds. You are exposed to [place] measles and have never had them. You are exposed to fifth disease or chickenpox. You develop a severe headache. You have shortness of breath. You have any kind of trauma, such as from a fall or a car accident. Document Released: 08/21/2001 Document Revised: 01/11/2014 Document Reviewed: 07/07/2013 [facility] Patient Information 2015 [place], [place]. This information is not intended to replace advice given to you by your health care provider. Make sure you discuss any questions you have with your health care provider.

## 2022-02-14 NOTE — Progress Notes (Signed)
[place] 13 wks,measurements c/w dates,FHR 149 bpm,NB present,NT 1.7 mm,CRL 71.31 mm,normal ovaries

## 2022-02-15 LAB — CYTOLOGY - PAP
Adequacy: ABSENT
Chlamydia: NEGATIVE
Comment: NEGATIVE
Comment: NORMAL
Diagnosis: NEGATIVE
Neisseria Gonorrhea: NEGATIVE

## 2022-02-16 ENCOUNTER — Other Ambulatory Visit: Payer: Self-pay | Admitting: Advanced Practice Midwife

## 2022-02-16 ENCOUNTER — Encounter: Payer: Self-pay | Admitting: Advanced Practice Midwife

## 2022-02-16 DIAGNOSIS — I10 Essential (primary) hypertension: Secondary | ICD-10-CM | POA: Insufficient documentation

## 2022-02-16 DIAGNOSIS — R8271 Bacteriuria: Secondary | ICD-10-CM

## 2022-02-16 LAB — INTEGRATED 1
Crown Rump Length: 71.3 mm
Gest. Age on Collection Date: 13.1 weeks
Maternal Age at EDD: 24.8 yr
Nuchal Translucency (NT): 1.7 mm
Number of Fetuses: 1
PAPP-A Value: 910.7 ng/mL
Weight: 227 [lb_av]

## 2022-02-16 LAB — COMPREHENSIVE METABOLIC PANEL
ALT: 17 IU/L (ref 0–32)
AST: 13 IU/L (ref 0–40)
Albumin/Globulin Ratio: 1.4 (ref 1.2–2.2)
Albumin: 4.1 g/dL (ref 3.9–5.0)
Alkaline Phosphatase: 70 IU/L (ref 44–121)
BUN/Creatinine Ratio: 15 (ref 9–23)
BUN: 8 mg/dL (ref 6–20)
Bilirubin Total: <0.2 mg/dL (ref 0.0–1.2)
CO2: 20 mmol/L (ref 20–29)
Calcium: 9.3 mg/dL (ref 8.7–10.2)
Chloride: 103 mmol/L (ref 96–106)
Creatinine, Ser: 0.55 mg/dL — ABNORMAL LOW (ref 0.57–1.00)
Globulin, Total: 2.9 g/dL (ref 1.5–4.5)
Glucose: 82 mg/dL (ref 70–99)
Potassium: 4.2 mmol/L (ref 3.5–5.2)
Sodium: 138 mmol/L (ref 134–144)
Total Protein: 7 g/dL (ref 6.0–8.5)
eGFR: 131 mL/min/{1.73_m2} (ref >59)

## 2022-02-16 LAB — CBC/D/PLT+RPR+RH+ABO+RUBIGG...
Antibody Screen: NEGATIVE
Basophils Absolute: 0.1 10*3/uL (ref 0.0–0.2)
Basos: 1 %
EOS (ABSOLUTE): 0.3 10*3/uL (ref 0.0–0.4)
Eos: 2 %
HCV Ab: NONREACTIVE
HIV Screen 4th Generation wRfx: NONREACTIVE
Hematocrit: 37.5 % (ref 34.0–46.6)
Hemoglobin: 12.9 g/dL (ref 11.1–15.9)
Hepatitis B Surface Ag: NEGATIVE
Immature Grans (Abs): 0.1 10*3/uL (ref 0.0–0.1)
Immature Granulocytes: 1 %
Lymphocytes Absolute: 2.1 10*3/uL (ref 0.7–3.1)
Lymphs: 14 %
MCH: 29.9 pg (ref 26.6–33.0)
MCHC: 34.4 g/dL (ref 31.5–35.7)
MCV: 87 fL (ref 79–97)
Monocytes Absolute: 0.8 10*3/uL (ref 0.1–0.9)
Monocytes: 5 %
Neutrophils Absolute: 11.6 10*3/uL — ABNORMAL HIGH (ref 1.4–7.0)
Neutrophils: 77 %
Platelets: 340 10*3/uL (ref 150–450)
RBC: 4.31 x10E6/uL (ref 3.77–5.28)
RDW: 12.4 % (ref 11.7–15.4)
RPR Ser Ql: NONREACTIVE
Rh Factor: POSITIVE
Rubella Antibodies, IGG: 7.26 index (ref >0.99)
WBC: 14.9 10*3/uL — ABNORMAL HIGH (ref 3.4–10.8)

## 2022-02-16 LAB — PROTEIN / CREATININE RATIO, URINE
Creatinine, Urine: 178.1 mg/dL
Protein, Ur: 31.3 mg/dL
Protein/Creat Ratio: 176 mg/g creat (ref 0–200)

## 2022-02-16 LAB — HCV INTERPRETATION

## 2022-02-16 LAB — HEMOGLOBIN A1C
Est. average glucose Bld gHb Est-mCnc: 114 mg/dL
Hgb A1c MFr Bld: 5.6 % (ref 4.8–5.6)

## 2022-02-16 LAB — URINE CULTURE

## 2022-02-16 MED ORDER — PENICILLIN V POTASSIUM 500 MG PO TABS: 500.0000 mg | ORAL_TABLET | Freq: Four times a day (QID) | ORAL | 0 refills | Status: AC

## 2022-02-26 ENCOUNTER — Encounter: Payer: Self-pay | Admitting: *Deleted

## 2022-02-26 ENCOUNTER — Encounter: Payer: Self-pay | Admitting: Women's Health

## 2022-02-26 DIAGNOSIS — Z148 Genetic carrier of other disease: Secondary | ICD-10-CM | POA: Insufficient documentation

## 2022-03-14 ENCOUNTER — Encounter: Payer: 59 | Admitting: Advanced Practice Midwife

## 2022-03-30 ENCOUNTER — Ambulatory Visit (INDEPENDENT_AMBULATORY_CARE_PROVIDER_SITE_OTHER): Payer: 59 | Admitting: Obstetrics & Gynecology

## 2022-03-30 ENCOUNTER — Encounter: Payer: Self-pay | Admitting: Obstetrics & Gynecology

## 2022-03-30 VITALS — BP 133/76 | HR 88 | Wt 223.0 lb

## 2022-03-30 DIAGNOSIS — Z3402 Encounter for supervision of normal first pregnancy, second trimester: Secondary | ICD-10-CM

## 2022-03-30 DIAGNOSIS — Z1379 Encounter for other screening for genetic and chromosomal anomalies: Secondary | ICD-10-CM

## 2022-03-30 NOTE — Progress Notes (Signed)
   LOW-RISK PREGNANCY VISIT Patient name: Susan Branch MRN [id number]  Date of birth: March 15, 1996 Chief Complaint:   OB visit  History of Present Illness:   Susan Branch is a 26 y.o. G61P0000 female at [redacted]w[redacted]d with an Estimated Date of Delivery: 08/22/22 being seen today for ongoing management of a low-risk pregnancy.     02/14/2022    9:47 AM 05/04/2020   10:12 AM 11/04/2019   12:08 PM 04/18/2018    9:08 AM 09/25/2017   10:30 AM  Depression screen PHQ 2/9  Decreased Interest 1 2 1 1 3   Down, Depressed, Hopeless 1 2 1 1 3   PHQ - 2 Score 2 4 2 2 6   Altered sleeping 1 2 1  0 3  Tired, decreased energy 1 2 1  0 3  Change in appetite 2 0 1 0 3  Feeling bad or failure about yourself  1 2 1  0 3  Trouble concentrating 1 2 1  0 3  Moving slowly or fidgety/restless 1 2 1  0 1  Suicidal thoughts 0 0 0 0 2  PHQ-9 Score 9 14 8 2 24   Difficult doing work/chores   Somewhat difficult Not difficult at all Very difficult    Today she reports no complaints. Contractions: Not present.  Starting to feel flutters.  Denies vaginal bleeding.   . denies leaking of fluid. Review of Systems:   Pertinent items are noted in HPI Denies abnormal vaginal discharge w/ itching/odor/irritation, headaches, visual changes, shortness of breath, chest pain, abdominal pain, severe nausea/vomiting, or problems with urination or bowel movements unless otherwise stated above. Pertinent History Reviewed:  Reviewed past medical,surgical, social, obstetrical and family history.  Reviewed problem list, medications and allergies.  Physical Assessment:   Vitals:   03/30/22 1141  BP: 133/76  Pulse: 88  Weight: 223 lb (101.2 kg)  Body mass index is 35.99 kg/m.        Physical Examination:   General appearance: Well appearing, and in no distress  Mental status: Alert, oriented to person, place, and time  Skin: Warm & dry  Respiratory: Normal respiratory effort, no distress  Abdomen: Soft, gravid, nontender  Pelvic: Cervical  exam deferred         Extremities: Edema: None  Psych:  mood and affect appropriate  Fetal Status: Fetal Heart Rate (bpm): 150        Chaperone: n/a    No results found for this or any previous visit (from the past 24 hour(s)).   Assessment & Plan:  1) Low-risk pregnancy G1P0000 at [redacted]w[redacted]d with an Estimated Date of Delivery: 08/22/22   -anatomy scheduled for next week (pt had to change appt) -IT-2 today  UTI- still on antibiotic will recheck next visit   Meds: No orders of the defined types were placed in this encounter.  Labs/procedures today: IT-2  Plan:  Continue routine obstetrical care  Next visit: prefers in person    Reviewed: Preterm labor symptoms and general obstetric precautions including but not limited to vaginal bleeding, contractions, leaking of fluid and fetal movement were reviewed in detail with the patient.  All questions were answered.   Follow-up: Return for keep appointment as scheduled.  Orders Placed This Encounter  Procedures   INTEGRATED 2    , DO Attending Obstetrician & Gynecologist, [facility] for , [facility]

## 2022-04-03 LAB — INTEGRATED 2
AFP MoM: 1.03
Alpha-Fetoprotein: 42.1 ng/mL
Crown Rump Length: 71.3 mm
DIA MoM: 1.28
DIA Value: 171.6 pg/mL
Estriol, Unconjugated: 1.33 ng/mL
Gest. Age on Collection Date: 13.1 weeks
Gestational Age: 19.4 weeks
Maternal Age at EDD: 24.8 yr
Nuchal Translucency (NT): 1.7 mm
Nuchal Translucency MoM: 1.05
Number of Fetuses: 1
PAPP-A MoM: 1.19
PAPP-A Value: 910.7 ng/mL
Test Results:: NEGATIVE
Weight: 223 [lb_av]
Weight: 227 [lb_av]
hCG MoM: 1.77
hCG Value: 31.5 IU/mL
uE3 MoM: 0.78

## 2022-04-04 ENCOUNTER — Ambulatory Visit (INDEPENDENT_AMBULATORY_CARE_PROVIDER_SITE_OTHER): Payer: 59 | Admitting: Advanced Practice Midwife

## 2022-04-04 ENCOUNTER — Encounter: Payer: Self-pay | Admitting: Obstetrics & Gynecology

## 2022-04-04 ENCOUNTER — Encounter: Payer: Self-pay | Admitting: Advanced Practice Midwife

## 2022-04-04 ENCOUNTER — Ambulatory Visit (INDEPENDENT_AMBULATORY_CARE_PROVIDER_SITE_OTHER): Payer: 59

## 2022-04-04 VITALS — BP 125/88 | HR 81 | Wt 224.0 lb

## 2022-04-04 DIAGNOSIS — Z3402 Encounter for supervision of normal first pregnancy, second trimester: Secondary | ICD-10-CM

## 2022-04-04 DIAGNOSIS — Z3A20 20 weeks gestation of pregnancy: Secondary | ICD-10-CM

## 2022-04-04 DIAGNOSIS — Z363 Encounter for antenatal screening for malformations: Secondary | ICD-10-CM | POA: Diagnosis not present

## 2022-04-04 DIAGNOSIS — Z3401 Encounter for supervision of normal first pregnancy, first trimester: Secondary | ICD-10-CM

## 2022-04-04 DIAGNOSIS — O2342 Unspecified infection of urinary tract in pregnancy, second trimester: Secondary | ICD-10-CM

## 2022-04-04 DIAGNOSIS — I10 Essential (primary) hypertension: Secondary | ICD-10-CM

## 2022-04-04 DIAGNOSIS — R8271 Bacteriuria: Secondary | ICD-10-CM

## 2022-04-04 NOTE — Progress Notes (Signed)
[place] 20 wks,breech,anterior placenta gr 0,normal ovaries,cx 3 cm,SVP of fluid 4.5 cm,FHR 152 bpm,LVEICF,limited view of fetal heart and profile/NB,please have pt come back for additional images,EFW 348 g 65%

## 2022-04-04 NOTE — Progress Notes (Signed)
   LOW-RISK PREGNANCY VISIT Patient name: Susan Branch MRN [id number]  Date of birth: 14-May-1996 Chief Complaint:   Routine Prenatal Visit and Pregnancy Ultrasound  History of Present Illness:   Susan Branch is a 26 y.o. G59P0000 female at [redacted]w[redacted]d with an Estimated Date of Delivery: 08/22/22 being seen today for ongoing management of a low-risk pregnancy.  Today she reports no complaints. Contractions: Not present.  .  Movement: Present. denies leaking of fluid. Review of Systems:   Pertinent items are noted in HPI Denies abnormal vaginal discharge w/ itching/odor/irritation, headaches, visual changes, shortness of breath, chest pain, abdominal pain, severe nausea/vomiting, or problems with urination or bowel movements unless otherwise stated above. Pertinent History Reviewed:  Reviewed past medical,surgical, social, obstetrical and family history.  Reviewed problem list, medications and allergies. Physical Assessment:   Vitals:   04/04/22 0937  BP: 125/88  Pulse: 81  Weight: 224 lb (101.6 kg)  Body mass index is 36.15 kg/m.        Physical Examination:   General appearance: Well appearing, and in no distress  Mental status: Alert, oriented to person, place, and time  Skin: Warm & dry  Cardiovascular: Normal heart rate noted  Respiratory: Normal respiratory effort, no distress  Abdomen: Soft, gravid, nontender  Pelvic: Cervical exam deferred         Extremities: Edema: None  Fetal Status: Fetal Heart Rate (bpm): 152 u/s   Movement: Present    Anatomy u/s: [place] 20 wks,breech,anterior placenta gr 0,normal ovaries,cx 3 cm,SVP of fluid 4.5 cm,FHR 152 bpm,LVEICF,limited view of fetal heart and profile/NB,please have pt come back for additional images,EFW 348 g 65%  No results found for this or any previous visit (from the past 24 hour(s)).  Assessment & Plan:  1) Low-risk pregnancy G1P0000 at [redacted]w[redacted]d with an Estimated Date of Delivery: 08/22/22   2) Previous GBS UTI, POC today; ppx  in labor   Meds: No orders of the defined types were placed in this encounter.  Labs/procedures today: urine culture; anatomy u/s  Plan:  Continue routine obstetrical care   Reviewed: Preterm labor symptoms and general obstetric precautions including but not limited to vaginal bleeding, contractions, leaking of fluid and fetal movement were reviewed in detail with the patient.  All questions were answered. Has home bp cuff.  Check bp weekly, let us know if >140/90.   Follow-up: Return in about 4 weeks (around 05/02/2022) for LROB, [place]: OB F/U anatomy views.  Orders Placed This Encounter  Procedures   Urine Culture   US OB Follow Up   Arabella Merles [facility] 04/04/2022 10:05 AM

## 2022-04-07 ENCOUNTER — Other Ambulatory Visit: Payer: Self-pay | Admitting: Advanced Practice Midwife

## 2022-04-07 DIAGNOSIS — R8271 Bacteriuria: Secondary | ICD-10-CM

## 2022-04-07 LAB — URINE CULTURE

## 2022-04-07 MED ORDER — NITROFURANTOIN MONOHYD MACRO 100 MG PO CAPS: 100.0000 mg | ORAL_CAPSULE | Freq: Two times a day (BID) | ORAL | 0 refills | Status: DC

## 2022-04-17 ENCOUNTER — Encounter: Payer: Self-pay | Admitting: Obstetrics & Gynecology

## 2022-04-24 ENCOUNTER — Ambulatory Visit (INDEPENDENT_AMBULATORY_CARE_PROVIDER_SITE_OTHER): Payer: 59

## 2022-04-24 ENCOUNTER — Ambulatory Visit (INDEPENDENT_AMBULATORY_CARE_PROVIDER_SITE_OTHER): Payer: 59 | Admitting: Women's Health

## 2022-04-24 ENCOUNTER — Encounter: Payer: Self-pay | Admitting: Women's Health

## 2022-04-24 VITALS — BP 113/74 | HR 96 | Wt 226.0 lb

## 2022-04-24 DIAGNOSIS — Z3402 Encounter for supervision of normal first pregnancy, second trimester: Secondary | ICD-10-CM

## 2022-04-24 DIAGNOSIS — Z3A22 22 weeks gestation of pregnancy: Secondary | ICD-10-CM

## 2022-04-24 DIAGNOSIS — Z87440 Personal history of urinary (tract) infections: Secondary | ICD-10-CM

## 2022-04-24 DIAGNOSIS — Z148 Genetic carrier of other disease: Secondary | ICD-10-CM

## 2022-04-24 DIAGNOSIS — Z363 Encounter for antenatal screening for malformations: Secondary | ICD-10-CM

## 2022-04-24 DIAGNOSIS — I10 Essential (primary) hypertension: Secondary | ICD-10-CM

## 2022-04-24 DIAGNOSIS — R8271 Bacteriuria: Secondary | ICD-10-CM

## 2022-04-24 NOTE — Progress Notes (Signed)
[place] 22+6 wks,cephalic,anterior placenta gr 0,normal ovaries,2 LVEICF,FHR 161 bpm,CX 3 cm,SVP of fluid 6.5 cm,EFW 602 74%,anatomy of the face and heart complete

## 2022-04-24 NOTE — Patient Instructions (Addendum)
Susan Branch, thank you for choosing our office today! We appreciate the opportunity to meet your healthcare needs. You may receive a short survey by mail, e-mail, or through [employer]. If you are happy with your care we would appreciate if you could take just a few minutes to complete the survey questions. We read all of your comments and take your feedback very seriously. Thank you again for choosing our office.  Center for [employer] Healthcare Team at [facility]  [facility] at [facility] ([address] [place], [place] [address]) Entrance C, located off of [address] Free 24/7 valet parking   For Headaches:  Stay well hydrated, drink enough water so that your urine is clear, sometimes if you are dehydrated you can get headaches Eat small frequent meals and snacks, sometimes if you are hungry you can get headaches Sometimes you get headaches during pregnancy from the pregnancy hormones You can try tylenol (1-2 regular strength 325mg  or 1-2 extra strength 500mg ) as directed on the box. The least amount of medication that works is best.  Cool compresses (cool wet washcloth or ice pack) to area of head that is hurting You can also try drinking a caffeinated drink to see if this will help If not helping, try below:  For Prevention of Headaches/Migraines: CoQ10 100mg  three times daily Vitamin B2 400mg  daily Magnesium Oxide 400-600mg  daily  Foods to alleviate migraines:  1) dark leafy greens 2) avocado 3) tuna 4) salmon  5) beans and legumes  Foods to avoid: 1) Excessive (or irregular timing) coffee 3) aged cheeses 4) chocolate 5) citrus fruits 6) aspartame and other artifical sweeteners 7) yeast 8) MSG (in processed foods) 9) processed and cured meats 10) nuts and certain seeds 11) chicken livers and other organ meats 12) dairy products like buttermilk, sour cream, and yogurt 13) dried fruits like dates, figs, and raisins 14) garlic 15) onions 16) potato chips 17)  pickled foods like olives and sauerkraut 18) some fresh fruits like ripe banana, papaya, red plums, raspberries, kiwi, pineapple 19) tomato-based products  Recommend to keep a migraine diary: rate daily the severity of your headache (1-10) and what foods you eat that day to help determine patterns.   If You Get a Bad Headache/Migraine: Benadryl 25mg   Magnesium Oxide 1 large Gatorade 2 extra strength Tylenol (1,000mg  total) 1 cup coffee or Coke      If this doesn't help please call @ [phone]   You will have your sugar test next visit.  Please do not eat or drink anything after midnight the night before you come, not even water.  You will be here for at least two hours.  Please make an appointment online for the bloodwork at for 8:00am (or as close to this as possible). Make sure you select the [facility] service center.   CLASSES: Go to [email or web address] to register for classes (childbirth, breastfeeding, waterbirth, infant CPR, daddy bootcamp, etc.)  Call the office [phone]) or go to [facility] if: You begin to have strong, frequent contractions Your water breaks.  Sometimes it is a big gush of fluid, sometimes it is just a trickle that keeps getting your panties wet or running down your legs You have vaginal bleeding.  It is normal to have a small amount of spotting if your cervix was checked.  You don't feel your baby moving like normal.  If you don't, get you something to eat and drink and lay down and focus on  feeling your baby move.   If your baby is still not moving like normal, you should call the office or go to [facility].  Call the office [phone]) or go to [facility] hospital for these signs of pre-eclampsia: Severe headache that does not go away with Tylenol Visual changes- seeing spots, double, blurred vision Pain under your right breast or upper abdomen that does not go away with Tums or heartburn medicine Nausea and/or vomiting Severe  swelling in your hands, feet, and face    [facility] Pediatricians/Family Doctors [facility]): [address] [place], [place]: [address] [place], [place] [facility]): [place], [phone] (call to ask if accepting patients) [facility] Department: [address], [place], [facility]): [address], [address], [place] [facility]: [address] [place], [place] [facility]: [place], [facility]): [phone] [facility]: [address] [place]: [address] [place] [facility]: [phone], [phone]  Home Blood Pressure Monitoring for Patients   Your provider has recommended that you check your blood pressure (BP) at least once a week at home. If you do not have a blood pressure cuff at home, one will be provided for you. Contact your provider if you have not received your monitor within 1 week.   Helpful Tips for Accurate Home Blood Pressure Checks  Don't smoke, exercise, or drink caffeine 30 minutes before checking your BP Use the restroom before checking your BP (a full bladder can raise your pressure) Relax in a comfortable upright chair Feet on the ground Left arm resting comfortably on a flat surface at the level of your heart Legs uncrossed Back supported Sit quietly and don't talk Place the cuff on your bare arm Adjust snuggly, so that only two fingertips can fit between your skin and the top of the cuff Check 2 readings separated by at least one minute Keep a log of your BP readings For a visual, please  reference this diagram: http://ccnc.care/bpdiagram  Provider Name: [facility]     Phone: [phone]  Zone 1: ALL CLEAR  Continue to monitor your symptoms:  BP reading is less than 140 (top number) or less than 90 (bottom number)  No right upper stomach pain No headaches or seeing spots No feeling nauseated or throwing up No swelling in face and hands  Zone 2: CAUTION Call your doctor's office for any of the following:  BP reading is greater than 140 (top number) or greater than 90 (bottom number)  Stomach pain under your ribs in the middle or right side Headaches or seeing spots Feeling nauseated or throwing up Swelling in face and hands  Zone 3: EMERGENCY  Seek immediate medical care if you have any of the following:  BP reading is greater than160 (top number) or greater than 110 (bottom number) Severe headaches not improving with Tylenol Serious difficulty catching your breath Any worsening symptoms from Zone 2   Second Trimester of Pregnancy The second trimester is from week 13 through week 28, months 4 through  6. The second trimester is often a time when you feel your best. Your body has also adjusted to being pregnant, and you begin to feel better physically. Usually, morning sickness has lessened or quit completely, you may have more energy, and you may have an increase in appetite. The second trimester is also a time when the fetus is growing rapidly. At the end of the sixth month, the fetus is about 9 inches long and weighs about 1 pounds. You will likely begin to feel the baby move (quickening) between 18 and 20 weeks of the pregnancy. BODY CHANGES Your body goes through many changes during pregnancy. The changes vary from woman to woman.  Your weight will continue to increase. You will notice your lower abdomen bulging out. You may begin to get stretch marks on your hips, abdomen, and breasts. You may develop headaches that can be relieved by medicines approved  by your health care provider. You may urinate more often because the fetus is pressing on your bladder. You may develop or continue to have heartburn as a result of your pregnancy. You may develop constipation because certain hormones are causing the muscles that push waste through your intestines to slow down. You may develop hemorrhoids or swollen, bulging veins (varicose veins). You may have back pain because of the weight gain and pregnancy hormones relaxing your joints between the bones in your pelvis and as a result of a shift in weight and the muscles that support your balance. Your breasts will continue to grow and be tender. Your gums may bleed and may be sensitive to brushing and flossing. Dark spots or blotches (chloasma, mask of pregnancy) may develop on your face. This will likely fade after the baby is born. A dark line from your belly button to the pubic area (linea nigra) may appear. This will likely fade after the baby is born. You may have changes in your hair. These can include thickening of your hair, rapid growth, and changes in texture. Some women also have hair loss during or after pregnancy, or hair that feels dry or thin. Your hair will most likely return to normal after your baby is born. WHAT TO EXPECT AT YOUR PRENATAL VISITS During a routine prenatal visit: You will be weighed to make sure you and the fetus are growing normally. Your blood pressure will be taken. Your abdomen will be measured to track your baby's growth. The fetal heartbeat will be listened to. Any test results from the previous visit will be discussed. Your health care provider may ask you: How you are feeling. If you are feeling the baby move. If you have had any abnormal symptoms, such as leaking fluid, bleeding, severe headaches, or abdominal cramping. If you have any questions. Other tests that may be performed during your second trimester include: Blood tests that check for: Low iron levels  (anemia). Gestational diabetes (between 24 and 28 weeks). Rh antibodies. Urine tests to check for infections, diabetes, or protein in the urine. An ultrasound to confirm the proper growth and development of the baby. An amniocentesis to check for possible genetic problems. Fetal screens for spina bifida and Down syndrome. HOME CARE INSTRUCTIONS  Avoid all smoking, herbs, alcohol, and unprescribed drugs. These chemicals affect the formation and growth of the baby. Follow your health care provider's instructions regarding medicine use. There are medicines that are either safe or unsafe to take during pregnancy. Exercise only as directed by your health care provider. Experiencing uterine cramps is a  good sign to stop exercising. Continue to eat regular, healthy meals. Wear a good support bra for breast tenderness. Do not use hot tubs, steam rooms, or saunas. Wear your seat belt at all times when driving. Avoid raw meat, uncooked cheese, cat litter boxes, and soil used by cats. These carry germs that can cause birth defects in the baby. Take your prenatal vitamins. Try taking a stool softener (if your health care provider approves) if you develop constipation. Eat more high-fiber foods, such as fresh vegetables or fruit and whole grains. Drink plenty of fluids to keep your urine clear or pale yellow. Take warm sitz baths to soothe any pain or discomfort caused by hemorrhoids. Use hemorrhoid cream if your health care provider approves. If you develop varicose veins, wear support hose. Elevate your feet for 15 minutes, 3-4 times a day. Limit salt in your diet. Avoid heavy lifting, wear low heel shoes, and practice good posture. Rest with your legs elevated if you have leg cramps or low back pain. Visit your dentist if you have not gone yet during your pregnancy. Use a soft toothbrush to brush your teeth and be gentle when you floss. A sexual relationship may be continued unless your health care  provider directs you otherwise. Continue to go to all your prenatal visits as directed by your health care provider. SEEK MEDICAL CARE IF:  You have dizziness. You have mild pelvic cramps, pelvic pressure, or nagging pain in the abdominal area. You have persistent nausea, vomiting, or diarrhea. You have a bad smelling vaginal discharge. You have pain with urination. SEEK IMMEDIATE MEDICAL CARE IF:  You have a fever. You are leaking fluid from your vagina. You have spotting or bleeding from your vagina. You have severe abdominal cramping or pain. You have rapid weight gain or loss. You have shortness of breath with chest pain. You notice sudden or extreme swelling of your face, hands, ankles, feet, or legs. You have not felt your baby move in over an hour. You have severe headaches that do not go away with medicine. You have vision changes. Document Released: 08/21/2001 Document Revised: 09/01/2013 Document Reviewed: 10/28/2012 [facility] Patient Information 2015 [place], [place]. This information is not intended to replace advice given to you by your health care provider. Make sure you discuss any questions you have with your health care provider.

## 2022-04-24 NOTE — Progress Notes (Signed)
LOW-RISK PREGNANCY VISIT Patient name: Susan Branch MRN [id number]  Date of birth: 1996-02-04 Chief Complaint:   Routine Prenatal Visit  History of Present Illness:   Susan Branch is a 26 y.o. G52P0000 female at 78w6dwith an Estimated Date of Delivery: 08/22/22 being seen today for ongoing management of a low-risk pregnancy.   Today she reports headaches. Transferring to [facility] is Dr. BIvor Costananny. Has 1st appt on 8/30.  Contractions: Not present. Vag. Bleeding: None.  Movement: Present. denies leaking of fluid.     02/14/2022    9:47 AM 05/04/2020   10:12 AM 11/04/2019   12:08 PM 04/18/2018    9:08 AM 09/25/2017   10:30 AM  Depression screen PHQ 2/9  Decreased Interest _0 Down, Depressed, Hopeless _1 PHQ - 2 Score _2 Altered sleeping _3 0 3  Tired, decreased energy _4 0 3  Change in appetite 2 0 1 0 3  Feeling bad or failure about yourself  _5 0 3  Trouble concentrating _6 0 3  Moving slowly or fidgety/restless _7 0 1  Suicidal thoughts 0 0 0 0 2  PHQ-9 Score _8 Difficult doing work/chores   Somewhat difficult Not difficult at all Very difficult        02/14/2022   10:04 AM  GAD 7 : Generalized Anxiety Score  Nervous, Anxious, on Edge 2  Control/stop worrying 1  Worry too much - different things 2  Trouble relaxing 1  Restless 1  Easily annoyed or irritable 1  Afraid - awful might happen 1  Total GAD 7 Score 9      Review of Systems:   Pertinent items are noted in HPI Denies abnormal vaginal discharge w/ itching/odor/irritation, headaches, visual changes, shortness of breath, chest pain, abdominal pain, severe nausea/vomiting, or problems with urination or bowel movements unless otherwise stated above. Pertinent History Reviewed:  Reviewed past medical,surgical, social, obstetrical and family history.  Reviewed problem list, medications and allergies. Physical Assessment:   Vitals:   04/24/22 1405   BP: 113/74  Pulse: 96  Weight: 226 lb (102.5 kg)  Body mass index is 36.48 kg/m.        Physical Examination:   General appearance: Well appearing, and in no distress  Mental status: Alert, oriented to person, place, and time  Skin: Warm & dry  Cardiovascular: Normal heart rate noted  Respiratory: Normal respiratory effort, no distress  Abdomen: Soft, gravid, nontender  Pelvic: Cervical exam deferred         Extremities: Edema: None  Fetal Status: Fetal Heart Rate (bpm): 161 u/s   Movement: Present  [place]215+0wks,cephalic,anterior placenta gr 0,normal ovaries,2 LVEICF,FHR 161 bpm,CX 3 cm,SVP of fluid 6.5 cm,EFW 602 g 74%,anatomy of the face and heart complete  Chaperone: N/A   No results found for this or any previous visit (from the past 24 hour(s)).  Assessment & Plan:  1) Low-risk pregnancy G1P0000 at 244w6dith an Estimated Date of Delivery: 08/22/22   2) Headaches, gave printed prevention/relief measures   3) Fetal isolated EICF> LR Panorama, discussed and gave printed info, no further f/u needed  4) Recent ASB> urine cx poc today  5) +SMA carrier> FOB plans testing, has kit, just needs to make appt   Meds: No orders of the defined types were placed  in this encounter.  Labs/procedures today: urine culture  Plan:  Continue routine obstetrical care  Reviewed: Preterm labor symptoms and general obstetric precautions including but not limited to vaginal bleeding, contractions, leaking of fluid and fetal movement were reviewed in detail with the patient.  All questions were answered. Does have home bp cuff. Office bp cuff given: not applicable. Check bp weekly, let us know if consistently >140 and/or >90.  Follow-up: Return for no futher appt w/ [place], transferring care.  No future appointments.   Orders Placed This Encounter  Procedures   Urine Culture   [place], [facility]  04/24/2022 2:35 PM

## 2022-04-26 ENCOUNTER — Telehealth: Payer: Self-pay | Admitting: *Deleted

## 2022-04-26 ENCOUNTER — Encounter (HOSPITAL_COMMUNITY): Payer: Self-pay | Admitting: Obstetrics & Gynecology

## 2022-04-26 ENCOUNTER — Other Ambulatory Visit: Payer: Self-pay

## 2022-04-26 ENCOUNTER — Inpatient Hospital Stay (HOSPITAL_COMMUNITY)
Admission: AD | Admit: 2022-04-26 | Discharge: 2022-04-26 | Disposition: A | Discharge status: Home / Self Care | Payer: 59 | Attending: Obstetrics & Gynecology | Admitting: Obstetrics & Gynecology

## 2022-04-26 DIAGNOSIS — O212 Late vomiting of pregnancy: Secondary | ICD-10-CM | POA: Diagnosis present

## 2022-04-26 DIAGNOSIS — R109 Unspecified abdominal pain: Secondary | ICD-10-CM

## 2022-04-26 DIAGNOSIS — O26892 Other specified pregnancy related conditions, second trimester: Secondary | ICD-10-CM | POA: Diagnosis not present

## 2022-04-26 DIAGNOSIS — Z3A23 23 weeks gestation of pregnancy: Secondary | ICD-10-CM | POA: Diagnosis not present

## 2022-04-26 DIAGNOSIS — I10 Essential (primary) hypertension: Secondary | ICD-10-CM

## 2022-04-26 DIAGNOSIS — R8271 Bacteriuria: Secondary | ICD-10-CM

## 2022-04-26 DIAGNOSIS — Z3401 Encounter for supervision of normal first pregnancy, first trimester: Secondary | ICD-10-CM

## 2022-04-26 LAB — URINALYSIS, ROUTINE W REFLEX MICROSCOPIC
Bilirubin Urine: NEGATIVE
Glucose, UA: NEGATIVE mg/dL
Hgb urine dipstick: NEGATIVE
Ketones, ur: NEGATIVE mg/dL
Leukocytes,Ua: NEGATIVE
Nitrite: NEGATIVE
Protein, ur: NEGATIVE mg/dL
Specific Gravity, Urine: 1.025 (ref 1.005–1.030)
pH: 5 (ref 5.0–8.0)

## 2022-04-26 LAB — URINE CULTURE

## 2022-04-26 MED ORDER — ALUM & MAG HYDROXIDE-SIMETH 200-200-20 MG/5ML PO SUSP
15.0000 mL | Freq: Once | ORAL | Status: AC
  Administered 2022-04-26: 15 mL via ORAL
  Filled 2022-04-26: qty 30

## 2022-04-26 MED ORDER — PANTOPRAZOLE SODIUM 20 MG PO TBEC
20.0000 mg | DELAYED_RELEASE_TABLET | Freq: Every day | ORAL | Status: DC
  Administered 2022-04-26: 20 mg via ORAL
  Filled 2022-04-26: qty 1

## 2022-04-26 MED ORDER — PANTOPRAZOLE SODIUM 20 MG PO TBEC: 20.0000 mg | DELAYED_RELEASE_TABLET | Freq: Every day | ORAL | 5 refills | Status: DC

## 2022-04-26 NOTE — Telephone Encounter (Signed)
Patient called with complaints of severe abdominal pain to the point that she had to pull over on the side of the road for about 40 minutes.  She called EMS who came and took her vitals which were normal.  States she then was able to drive to [facility] ED but is currently in the parking lot not sure if she should go there or come to our office.  States the pain started in her upper abdomen but has now radiated to her lower abdomen.  Woke up vomiting this morning as well.  Denies bleeding or leaking.  Advised to go to [facility] for evaluation as she is only 23wks.  May need fluids or medication to stop contractions. Pt verbalized understanding and agreeable to plan.

## 2022-04-26 NOTE — MAU Provider Note (Addendum)
History     CSN: [id number]  Arrival date and time: 04/26/22 1052   Event Date/Time   First Provider Initiated Contact with Patient 04/26/22 1214      Chief Complaint  Patient presents with   Abdominal Pain   Abdominal Pain   Susan Branch is a G1P0 at [redacted]w[redacted]d who presents with a 6 hour history of acute onset abdominal pain with associated nausea and vomitting. The pain began this morning when she woke up. She has never experience anything like this before. Soon after the pain began she became nauseous and had an episode of vomiting. She has since vomited 4-5 times. She reports that the pain gets worse with walking. She had one small meal this morning after the pain began and did not feel it increased or decreased the pain. She had not eaten for many hours prior to that meal. She describes the pain as somewhat diffuse, but localized in the epigastric region. It does not radiate to the shoulder or back. She denies sick contacts. She denies constipation or diarrhea. She denies pain with urination or increase in urinary frequency. She denies vaginal bleeding or leaking fluid.   OB History     Gravida  1   Para  0   Term  0   Preterm  0   AB  0   Living  0      SAB  0   IAB  0   Ectopic  0   Multiple  0   Live Births  0           Past Medical History:  Diagnosis Date   Allergy    Anxiety    Borderline hypertension    no current med.   Headache    Tonsillar and adenoid hypertrophy 05/2013   snores during sleep, mother denies apnea    Past Surgical History:  Procedure Laterality Date   TONSILLECTOMY AND ADENOIDECTOMY N/A 06/02/2013   Procedure: TONSILLECTOMY AND ADENOIDECTOMY;  Surgeon: [name], MD;  Location: [facility];  Service: ENT;  Laterality: N/A;   TURBINATE REDUCTION Bilateral 06/02/2013   Procedure: TURBINATE REDUCTION BILATERAL;  Surgeon: [name], MD;  Location: [facility];  Service: ENT;  Laterality: Bilateral;     Family History  Problem Relation Age of Onset   Diabetes Mother    Depression Father    Diabetes Father    Hypertension Father    Heart disease Father        MI   Hyperlipidemia Father    Kidney disease Father    Vision loss Father    Bipolar disorder Father    Vision loss Maternal Grandmother    Stroke Maternal Grandmother    Diabetes Paternal Grandmother    Kidney failure Paternal Grandmother    Diabetes Paternal Grandfather    Kidney failure Paternal Grandfather    Muscular dystrophy Paternal Grandfather     Social History   Tobacco Use   Smoking status: Never    Passive exposure: Yes   Smokeless tobacco: Never   Tobacco comments:    father smokes outside  Vaping Use   Vaping Use: Never used  Substance Use Topics   Alcohol use: No   Drug use: Not Currently    Frequency: 4.0 times per week    Types: Marijuana    Allergies:  Allergies  Allergen Reactions   Adhesive [Tape] Rash   Latex Rash   Soap Rash    Medications  Prior to Admission  Medication Sig Dispense Refill Last Dose   aspirin 81 MG chewable tablet Chew 2 tablets (162 mg total) by mouth daily. (Patient not taking: Reported on 04/04/2022) 60 tablet 7    Prenatal Vit-Fe Fumarate-FA (MULTIVITAMIN-PRENATAL) 27-0.8 MG TABS tablet Take 1 tablet by mouth daily at 12 noon.      terconazole (TERAZOL 7) 0.4 % vaginal cream Place 1 applicator vaginally at bedtime. (Patient not taking: Reported on 04/24/2022) 45 g 0     Review of Systems  Gastrointestinal:  Positive for abdominal pain.  Pertinent positives and negative per HPI, all others reviewed and negative   Physical Exam   Blood pressure 134/72, pulse 98, temperature 98 F (36.7 C), temperature source Oral, resp. rate 18, height 5\' 4"  (1.626 m), weight 102.6 kg, last menstrual period 11/02/2021, SpO2 100 %.   Physical Exam Vitals reviewed.  Constitutional:      General: She is not in acute distress.    Appearance: She is well-developed. She  is not ill-appearing, toxic-appearing or diaphoretic.  HENT:     Mouth/Throat:     Mouth: Mucous membranes are moist.  Eyes:     General: No scleral icterus. Abdominal:     General: Abdomen is flat. Bowel sounds are normal. There is no distension.     Palpations: Abdomen is soft.     Tenderness: There is abdominal tenderness in the epigastric area.  Skin:    General: Skin is warm and dry.  Neurological:     General: No focal deficit present.     Dilation: Closed Effacement (%): Thick Exam by:: Dr. [email or web address]   Fetal Heart Tracing Baseline 140 Variability moderate Accels present Decels absent Toco flat Cat I   MAU Course  Procedures  MDM Minimal  Meds ordered this encounter  Medications   alum & mag hydroxide-simeth (MAALOX/MYLANTA) 200-200-20 MG/5ML suspension 15 mL   pantoprazole (PROTONIX) EC tablet 20 mg   pantoprazole (PROTONIX) 20 MG tablet    Sig: Take 1 tablet (20 mg total) by mouth daily.    Dispense:  30 tablet    Refill:  5   Lab Orders         Urinalysis, Routine w reflex microscopic Urine, Clean Catch      Assessment and Plan  Susan Branch is a G1P0 at [redacted]w[redacted]d who presents with a 6 hour history of acute onset abdominal pain with associated nausea and vomitting.  Abdominal Pain in pregnancy, second trimester [redacted] weeks gestation of pregnancy Pt presentation not concerning for labor but cervical exam performed and was closed/long. Fetal heart rate tracing reassuring and reactive. Treated empirically with PPI and maalox with improvement in symptoms. Unclear presentation overall, but most likely diagnosis is gastritis. Pt shared readiness for d/c with nurse and counseled to return to ED if pain persists or worsens.   [redacted]w[redacted]d 04/26/2022, 1:32 PM     ATTENDING ATTESTATION  I have seen and examined this patient and agree with the above documentation in the medical student's note except as below.  I have edited the above note for accuracy and  clarity  04/28/2022, MD/[facility] (Faculty Practice) 04/26/2022, 6:01 PM

## 2022-04-26 NOTE — MAU Note (Signed)
Susan Branch is a 26 y.o. at [redacted]w[redacted]d here in MAU reporting:  abdominal pain that began this morning .  Reports pain is intermittent sharp and cramping.  Reports pain has now decreased.  States was evaluated by EMS prior to arrival to hospital, didn't want to be transported to hospital by EMS.  Denies VB or LOF.  Endorses +FM. LMP: N/A Onset of complaint: today Pain score: 4 Vitals:   04/26/22 1129  BP: 139/71  Pulse: 92  Resp: 18  Temp: 98 F (36.7 C)  SpO2: 100%     FHT:147 bpm Lab orders placed from triage:   UA

## 2022-04-30 ENCOUNTER — Other Ambulatory Visit: Payer: 59

## 2022-04-30 ENCOUNTER — Encounter: Payer: 59 | Admitting: Women's Health

## 2022-06-13 ENCOUNTER — Ambulatory Visit: Payer: 59

## 2022-06-20 DIAGNOSIS — Z3483 Encounter for supervision of other normal pregnancy, third trimester: Secondary | ICD-10-CM | POA: Diagnosis not present

## 2022-06-20 DIAGNOSIS — O24410 Gestational diabetes mellitus in pregnancy, diet controlled: Secondary | ICD-10-CM | POA: Diagnosis not present

## 2022-06-20 DIAGNOSIS — Z3A31 31 weeks gestation of pregnancy: Secondary | ICD-10-CM | POA: Diagnosis not present

## 2022-06-28 ENCOUNTER — Ambulatory Visit (INDEPENDENT_AMBULATORY_CARE_PROVIDER_SITE_OTHER): Payer: 59 | Admitting: Family Medicine

## 2022-06-28 ENCOUNTER — Encounter: Payer: Self-pay | Admitting: Family Medicine

## 2022-06-28 DIAGNOSIS — F321 Major depressive disorder, single episode, moderate: Secondary | ICD-10-CM | POA: Diagnosis not present

## 2022-06-28 NOTE — Patient Instructions (Signed)
I appreciate the opportunity to provide care to you today!    Follow up:  3 months  Labs: Next visit      Please continue to a heart-healthy diet and increase your physical activities. Try to exercise for 30mins at least three times a week.      It was a pleasure to see you and I look forward to continuing to work together on your health and well-being. Please do not hesitate to call the office if you need care or have questions about your care.   Have a wonderful day and week. With Gratitude, Twala Collings MSN, FNP-BC  

## 2022-06-28 NOTE — Progress Notes (Signed)
New Patient Office Visit  Subjective:  Patient ID: Susan Branch, female    DOB: February 22, 1996  Age: 26 y.o. MRN: [id number]  CC:  Chief Complaint  Patient presents with   Establish Care   Gestational Diabetes    Glu this am 105    HPI Susan Branch is a 26 y.o. female with past medical history of gestational diabetes, and third trimester pregnancy presents for establishing care.  The patient is here to establish care with no complaints or concerns voiced today.  She reports that she was seeing Dr. Buelah Manis and has not had a primary care provider since Dr. Buelah Manis left a year ago.  She is currently pregnant with her first child and is expecting a boy.  She is due in December 2023.  Past Medical History:  Diagnosis Date   Allergy    Anxiety    Borderline hypertension    no current med.   Headache    Tonsillar and adenoid hypertrophy 05/2013   snores during sleep, mother denies apnea    Past Surgical History:  Procedure Laterality Date   TONSILLECTOMY AND ADENOIDECTOMY N/A 06/02/2013   Procedure: TONSILLECTOMY AND ADENOIDECTOMY;  Surgeon: Ascencion Dike, MD;  Location: [place];  Service: ENT;  Laterality: N/A;   TURBINATE REDUCTION Bilateral 06/02/2013   Procedure: TURBINATE REDUCTION BILATERAL;  Surgeon: Ascencion Dike, MD;  Location: [place];  Service: ENT;  Laterality: Bilateral;    Family History  Problem Relation Age of Onset   Diabetes Mother    Depression Father    Diabetes Father    Hypertension Father    Heart disease Father        MI   Hyperlipidemia Father    Kidney disease Father    Vision loss Father    Bipolar disorder Father    Vision loss Maternal Grandmother    Stroke Maternal Grandmother    Diabetes Paternal Grandmother    Kidney failure Paternal Grandmother    Diabetes Paternal Grandfather    Kidney failure Paternal Grandfather    Muscular dystrophy Paternal Grandfather     Social History   Socioeconomic History    Marital status: Single    Spouse name: Not on file   Number of children: Not on file   Years of education: Not on file   Highest education level: Not on file  Occupational History   Not on file  Tobacco Use   Smoking status: Never    Passive exposure: Yes   Smokeless tobacco: Never   Tobacco comments:    father smokes outside  Vaping Use   Vaping Use: Never used  Substance and Sexual Activity   Alcohol use: No   Drug use: Not Currently    Frequency: 4.0 times per week    Types: Marijuana   Sexual activity: Yes    Birth control/protection: None  Other Topics Concern   Not on file  Social History Narrative   Not on file   Social Determinants of Health   Financial Resource Strain: Low Risk  (02/14/2022)   Overall Financial Resource Strain (CARDIA)    Difficulty of Paying Living Expenses: Not very hard  Food Insecurity: No Food Insecurity (02/14/2022)   Hunger Vital Sign    Worried About Running Out of Food in the Last Year: Never true    Ran Out of Food in the Last Year: Never true  Transportation Needs: No Transportation Needs (02/14/2022)   [place] - Transportation  Lack of Transportation (Medical): No    Lack of Transportation (Non-Medical): No  Physical Activity: Sufficiently Active (02/14/2022)   Exercise Vital Sign    Days of Exercise per Week: 7 days    Minutes of Exercise per Session: 70 min  Stress: No Stress Concern Present (02/14/2022)   [place]    Feeling of Stress : Only a little  Social Connections: Moderately Isolated (02/14/2022)   Social Connection and Isolation Panel [NHANES]    Frequency of Communication with Friends and Family: More than three times a week    Frequency of Social Gatherings with Friends and Family: More than three times a week    Attends Religious Services: 1 to 4 times per year    Active Member of [employer] or Organizations: Patient refused    Attends Archivist  Meetings: Never    Marital Status: Never married  Intimate Partner Violence: Not At Risk (02/14/2022)   Humiliation, Afraid, Rape, and Kick questionnaire    Fear of Current or Ex-Partner: No    Emotionally Abused: No    Physically Abused: No    Sexually Abused: No    ROS Review of Systems  Constitutional:  Negative for chills and fatigue.  HENT:  Negative for rhinorrhea and trouble swallowing.   Eyes:  Negative for redness and visual disturbance.  Respiratory:  Negative for chest tightness and shortness of breath.   Cardiovascular:  Negative for chest pain and palpitations.  Gastrointestinal:  Negative for constipation and rectal pain.  Endocrine: Negative for cold intolerance and heat intolerance.  Genitourinary:  Negative for menstrual problem and vaginal pain.  Musculoskeletal:  Negative for joint swelling and myalgias.  Skin:  Negative for wound.  Neurological:  Negative for seizures and syncope.  Hematological:  Does not bruise/bleed easily.  Psychiatric/Behavioral:  Negative for self-injury and suicidal ideas.     Objective:   Today's Vitals: BP 113/77   Pulse 93   Resp 16   Ht 5' 5.5" (1.664 m)   Wt 237 lb (107.5 kg)   LMP 11/02/2021 (Within Weeks)   SpO2 96%   BMI 38.84 kg/m   Physical Exam Constitutional:      Appearance: She is obese.  HENT:     Head: Normocephalic.     Right Ear: External ear normal.     Left Ear: External ear normal.     Nose: No congestion.     Mouth/Throat:     Mouth: Mucous membranes are moist.     Pharynx: No oropharyngeal exudate or posterior oropharyngeal erythema.  Eyes:     Extraocular Movements: Extraocular movements intact.     Pupils: Pupils are equal, round, and reactive to light.  Cardiovascular:     Rate and Rhythm: Normal rate and regular rhythm.     Pulses: Normal pulses.     Heart sounds: Normal heart sounds.  Pulmonary:     Effort: Pulmonary effort is normal.     Breath sounds: Normal breath sounds. No wheezing.   Abdominal:     Palpations: Abdomen is soft.     Tenderness: There is no right CVA tenderness or left CVA tenderness.  Musculoskeletal:     Cervical back: No tenderness.     Right lower leg: No edema.     Left lower leg: No edema.  Skin:    General: Skin is warm.     Findings: No lesion.  Neurological:     Mental Status: She  is alert and oriented to person, place, and time.  Psychiatric:     Comments: Normal affect     Assessment & Plan:   Problem List Items Addressed This Visit       Other   Depression, major, single episode, moderate ([place])    She denies suicidal ideation and homicidal ideations She is currently not on treatment for her depression and does not want treatment for her depression currently          Outpatient Encounter Medications as of 06/28/2022  Medication Sig   aspirin 81 MG chewable tablet Chew 2 tablets (162 mg total) by mouth daily.   Prenatal Vit-Fe Fumarate-FA (MULTIVITAMIN-PRENATAL) 27-0.8 MG TABS tablet Take 1 tablet by mouth daily at 12 noon.   [DISCONTINUED] cetirizine (ZYRTEC ALLERGY) 10 MG tablet Take 1 tablet (10 mg total) by mouth daily.   [DISCONTINUED] fluticasone (FLONASE) 50 MCG/ACT nasal spray Place 1 spray into both nostrils daily for 14 days.   [DISCONTINUED] norgestimate-ethinyl estradiol (SPRINTEC 28) 0.25-35 MG-MCG tablet Take 1 tablet by mouth daily.   [DISCONTINUED] pantoprazole (PROTONIX) 20 MG tablet Take 1 tablet (20 mg total) by mouth daily.   [DISCONTINUED] terconazole (TERAZOL 7) 0.4 % vaginal cream Place 1 applicator vaginally at bedtime. (Patient not taking: Reported on 04/24/2022)   No facility-administered encounter medications on file as of 06/28/2022.    Follow-up: Return in about 3 months (around 09/28/2022).   Alvira Monday, FNP

## 2022-06-28 NOTE — Assessment & Plan Note (Signed)
She denies suicidal ideation and homicidal ideations She is currently not on treatment for her depression and does not want treatment for her depression currently

## 2022-07-05 ENCOUNTER — Ambulatory Visit
Admission: EM | Admit: 2022-07-05 | Discharge: 2022-07-05 | Disposition: A | Discharge status: Home / Self Care | Payer: 59

## 2022-07-05 ENCOUNTER — Encounter: Payer: Self-pay | Admitting: *Deleted

## 2022-07-05 DIAGNOSIS — J029 Acute pharyngitis, unspecified: Secondary | ICD-10-CM

## 2022-07-05 DIAGNOSIS — J3089 Other allergic rhinitis: Secondary | ICD-10-CM

## 2022-07-05 MED ORDER — FLUTICASONE PROPIONATE 50 MCG/ACT NA SUSP: 1.0000 | Freq: Two times a day (BID) | NASAL | 2 refills | Status: DC

## 2022-07-05 MED ORDER — CETIRIZINE HCL 10 MG PO TABS: 10.0000 mg | ORAL_TABLET | Freq: Every day | ORAL | 2 refills | Status: DC

## 2022-07-05 NOTE — Discharge Instructions (Addendum)
It is safe to take your allergy regimen of Zyrtec and Flonase while pregnant.  I have sent these medications in for you.  Regarding your current symptoms, you may also take Sudafed, throat lozenges and Chloraseptic spray as needed in addition to Tylenol.

## 2022-07-05 NOTE — ED Triage Notes (Signed)
Pt states she has had a sore throat since Tuesday and no other sx she wants to know if she has strep. She said its worse when she is coughing. She is pregnant so she doesn't know what to take.

## 2022-07-09 NOTE — ED Provider Notes (Signed)
[facility]    CSN: [id number] Arrival date & time: 07/05/22  1856      History   Chief Complaint Chief Complaint  Patient presents with   Sore Throat    HPI Susan Branch is a 26 y.o. female.   Patient presenting today with several day history of sore, swollen feeling throat, cough, congestion.  Denies fever, chills, chest pain, shortness of breath, abdominal pain, nausea vomiting or diarrhea.  So far not tried anything over-the-counter for symptoms because she is pregnant and she is unsure what she can take.  No known sick contacts recently.    Past Medical History:  Diagnosis Date   Allergy    Anxiety    Borderline hypertension    no current med.   Headache    Tonsillar and adenoid hypertrophy 05/2013   snores during sleep, mother denies apnea    Patient Active Problem List   Diagnosis Date Noted   Carrier of spinal muscular atrophy 02/26/2022   Elevated blood pressure reading with diagnosis of hypertension 02/16/2022   GBS bacteriuria 02/16/2022   Supervision of normal first pregnancy 02/13/2022   GAD (generalized anxiety disorder) 10/01/2018   Depression, major, single episode, moderate ([facility]) 09/25/2017   Obesity 04/22/2014   Acne vulgaris 04/22/2014   Migraine headache 04/22/2014    Past Surgical History:  Procedure Laterality Date   TONSILLECTOMY AND ADENOIDECTOMY N/A 06/02/2013   Procedure: TONSILLECTOMY AND ADENOIDECTOMY;  Surgeon: [name], MD;  Location: [facility];  Service: ENT;  Laterality: N/A;   TURBINATE REDUCTION Bilateral 06/02/2013   Procedure: TURBINATE REDUCTION BILATERAL;  Surgeon: [name], MD;  Location: [facility];  Service: ENT;  Laterality: Bilateral;    OB History     Gravida  1   Para  0   Term  0   Preterm  0   AB  0   Living  0      SAB  0   IAB  0   Ectopic  0   Multiple  0   Live Births  0            Home Medications    Prior to Admission medications    Medication Sig Start Date End Date Taking? Authorizing Provider  aspirin 81 MG chewable tablet Chew 2 tablets (162 mg total) by mouth daily. 02/14/22  Yes Arabella Merles, CNM  cetirizine (ZYRTEC ALLERGY) 10 MG tablet Take 1 tablet (10 mg total) by mouth daily. 07/05/22  Yes Particia Nearing, PA-C  fluticasone [facility]) 50 MCG/ACT nasal spray Place 1 spray into both nostrils 2 (two) times daily. 07/05/22  Yes Particia Nearing, PA-C  Prenatal Vit-Fe Fumarate-FA (MULTIVITAMIN-PRENATAL) 27-0.8 MG TABS tablet Take 1 tablet by mouth daily at 12 noon.   Yes [provider]  metFORMIN (GLUCOPHAGE) 500 MG tablet Take 500 mg by mouth daily. 07/03/22   [provider]  norgestimate-ethinyl estradiol (SPRINTEC 28) 0.25-35 MG-MCG tablet Take 1 tablet by mouth daily. 11/04/19 12/22/19  Salley Scarlet, MD    Family History Family History  Problem Relation Age of Onset   Diabetes Mother    Depression Father    Diabetes Father    Hypertension Father    Heart disease Father        MI   Hyperlipidemia Father    Kidney disease Father    Vision loss Father    Bipolar disorder Father    Vision loss Maternal  Grandmother    Stroke Maternal Grandmother    Diabetes Paternal Grandmother    Kidney failure Paternal Grandmother    Diabetes Paternal Grandfather    Kidney failure Paternal Grandfather    Muscular dystrophy Paternal Grandfather     Social History Social History   Tobacco Use   Smoking status: Never    Passive exposure: Yes   Smokeless tobacco: Never  Vaping Use   Vaping Use: Never used  Substance Use Topics   Alcohol use: No   Drug use: Not Currently    Frequency: 4.0 times per week    Types: Marijuana     Allergies   Adhesive [tape], Latex, and Soap   Review of Systems Review of Systems Per HPI  Physical Exam Triage Vital Signs ED Triage Vitals  Enc Vitals Group     BP 07/05/22 1950 106/61     Pulse Rate 07/05/22 1950 (!) 108     Resp  07/05/22 1950 18     Temp 07/05/22 1950 98.7 F (37.1 C)     Temp Source 07/05/22 1950 Oral     SpO2 07/05/22 1950 98 %     Weight --      Height --      Head Circumference --      Peak Flow --      Pain Score 07/05/22 1949 6     Pain Loc --      Pain Edu? --      Excl. in GC? --    No data found.  Updated Vital Signs BP 106/61 (BP Location: Right Arm) Comment (BP Location): large cuff  Pulse 99   Temp 98.7 F (37.1 C) (Oral)   Resp 20   LMP 11/02/2021 (Within Weeks)   SpO2 99%   Visual Acuity Right Eye Distance:   Left Eye Distance:   Bilateral Distance:    Right Eye Near:   Left Eye Near:    Bilateral Near:     Physical Exam Vitals and nursing note reviewed.  Constitutional:      Appearance: Normal appearance.  HENT:     Head: Atraumatic.     Right Ear: Tympanic membrane and external ear normal.     Left Ear: Tympanic membrane and external ear normal.     Nose: Rhinorrhea present.     Mouth/Throat:     Mouth: Mucous membranes are moist.     Pharynx: Posterior oropharyngeal erythema present.  Eyes:     Extraocular Movements: Extraocular movements intact.     Conjunctiva/sclera: Conjunctivae normal.  Cardiovascular:     Rate and Rhythm: Normal rate and regular rhythm.     Heart sounds: Normal heart sounds.  Pulmonary:     Effort: Pulmonary effort is normal.     Breath sounds: Normal breath sounds. No wheezing or rales.  Musculoskeletal:        General: Normal range of motion.     Cervical back: Normal range of motion and neck supple.  Lymphadenopathy:     Cervical: No cervical adenopathy.  Skin:    General: Skin is warm and dry.  Neurological:     Mental Status: She is alert and oriented to person, place, and time.  Psychiatric:        Mood and Affect: Mood normal.        Thought Content: Thought content normal.      UC Treatments / Results  Labs (all labs ordered are listed, but only abnormal results are displayed) Labs  Reviewed  POCT RAPID  STREP A (OFFICE)    EKG   Radiology No results found.  Procedures Procedures (including critical care time)  Medications Ordered in UC Medications - No data to display  Initial Impression / Assessment and Plan / UC Course  I have reviewed the triage vital signs and the nursing notes.  Pertinent labs & imaging results that were available during my care of the patient were reviewed by me and considered in my medical decision making (see chart for details).     Vital signs and exam reassuring and suggestive of a viral upper respiratory infection.  Rapid strep negative, declines viral testing today.  Discussed safe over-the-counter medications and home care during pregnancy and will restart allergy regimen with Zyrtec and Flonase as she has a known history of seasonal allergies that she is not currently taking anything for.  Return for worsening symptoms.  Final Clinical Impressions(s) / UC Diagnoses   Final diagnoses:  Sore throat  Seasonal allergic rhinitis due to other allergic trigger     Discharge Instructions      It is safe to take your allergy regimen of Zyrtec and Flonase while pregnant.  I have sent these medications in for you.  Regarding your current symptoms, you may also take Sudafed, throat lozenges and Chloraseptic spray as needed in addition to Tylenol.    ED Prescriptions     Medication Sig Dispense Auth. Provider   cetirizine (ZYRTEC ALLERGY) 10 MG tablet Take 1 tablet (10 mg total) by mouth daily. 30 tablet Volney American, PA-C   fluticasone [facility]) 50 MCG/ACT nasal spray Place 1 spray into both nostrils 2 (two) times daily. 16 g Volney American, [place]      PDMP not reviewed this encounter.   Volney American, [place] 07/09/22 2021

## 2022-07-11 ENCOUNTER — Encounter (HOSPITAL_COMMUNITY): Payer: Self-pay | Admitting: Obstetrics and Gynecology

## 2022-07-11 ENCOUNTER — Inpatient Hospital Stay (HOSPITAL_COMMUNITY)
Admission: AD | Admit: 2022-07-11 | Discharge: 2022-07-11 | Disposition: A | Discharge status: Home / Self Care | Payer: 59 | Attending: Obstetrics and Gynecology | Admitting: Obstetrics and Gynecology

## 2022-07-11 ENCOUNTER — Inpatient Hospital Stay (HOSPITAL_COMMUNITY): Payer: 59

## 2022-07-11 DIAGNOSIS — Z3A34 34 weeks gestation of pregnancy: Secondary | ICD-10-CM | POA: Insufficient documentation

## 2022-07-11 DIAGNOSIS — K8020 Calculus of gallbladder without cholecystitis without obstruction: Secondary | ICD-10-CM | POA: Diagnosis not present

## 2022-07-11 DIAGNOSIS — D72829 Elevated white blood cell count, unspecified: Secondary | ICD-10-CM

## 2022-07-11 DIAGNOSIS — O99213 Obesity complicating pregnancy, third trimester: Secondary | ICD-10-CM | POA: Insufficient documentation

## 2022-07-11 DIAGNOSIS — K8012 Calculus of gallbladder with acute and chronic cholecystitis without obstruction: Secondary | ICD-10-CM | POA: Insufficient documentation

## 2022-07-11 DIAGNOSIS — O26893 Other specified pregnancy related conditions, third trimester: Secondary | ICD-10-CM | POA: Diagnosis present

## 2022-07-11 DIAGNOSIS — O99113 Other diseases of the blood and blood-forming organs and certain disorders involving the immune mechanism complicating pregnancy, third trimester: Secondary | ICD-10-CM | POA: Diagnosis present

## 2022-07-11 DIAGNOSIS — O26613 Liver and biliary tract disorders in pregnancy, third trimester: Secondary | ICD-10-CM | POA: Diagnosis not present

## 2022-07-11 DIAGNOSIS — R1011 Right upper quadrant pain: Secondary | ICD-10-CM | POA: Diagnosis not present

## 2022-07-11 DIAGNOSIS — R112 Nausea with vomiting, unspecified: Secondary | ICD-10-CM

## 2022-07-11 LAB — COMPREHENSIVE METABOLIC PANEL
ALT: 22 U/L (ref 0–44)
AST: 21 U/L (ref 15–41)
Albumin: 3.2 g/dL — ABNORMAL LOW (ref 3.5–5.0)
Alkaline Phosphatase: 125 U/L (ref 38–126)
Anion gap: 14 (ref 5–15)
BUN: 7 mg/dL (ref 6–20)
CO2: 19 mmol/L — ABNORMAL LOW (ref 22–32)
Calcium: 10.1 mg/dL (ref 8.9–10.3)
Chloride: 104 mmol/L (ref 98–111)
Creatinine, Ser: 0.69 mg/dL (ref 0.44–1.00)
GFR, Estimated: >60 mL/min (ref >60)
Glucose, Bld: 134 mg/dL — ABNORMAL HIGH (ref 70–99)
Potassium: 4 mmol/L (ref 3.5–5.1)
Sodium: 137 mmol/L (ref 135–145)
Total Bilirubin: 0.5 mg/dL (ref 0.3–1.2)
Total Protein: 7.3 g/dL (ref 6.5–8.1)

## 2022-07-11 LAB — CBC WITH DIFFERENTIAL/PLATELET
Abs Immature Granulocytes: 0.11 10*3/uL — ABNORMAL HIGH (ref 0.00–0.07)
Basophils Absolute: 0 10*3/uL (ref 0.0–0.1)
Basophils Relative: 0 %
Eosinophils Absolute: 0 10*3/uL (ref 0.0–0.5)
Eosinophils Relative: 0 %
HCT: 35.9 % — ABNORMAL LOW (ref 36.0–46.0)
Hemoglobin: 12.7 g/dL (ref 12.0–15.0)
Immature Granulocytes: 1 %
Lymphocytes Relative: 7 %
Lymphs Abs: 1.1 10*3/uL (ref 0.7–4.0)
MCH: 29.5 pg (ref 26.0–34.0)
MCHC: 35.4 g/dL (ref 30.0–36.0)
MCV: 83.5 fL (ref 80.0–100.0)
Monocytes Absolute: 0.4 10*3/uL (ref 0.1–1.0)
Monocytes Relative: 3 %
Neutro Abs: 14.3 10*3/uL — ABNORMAL HIGH (ref 1.7–7.7)
Neutrophils Relative %: 89 %
Platelets: 296 10*3/uL (ref 150–400)
RBC: 4.3 MIL/uL (ref 3.87–5.11)
RDW: 13 % (ref 11.5–15.5)
WBC: 16 10*3/uL — ABNORMAL HIGH (ref 4.0–10.5)
nRBC: 0 % (ref 0.0–0.2)

## 2022-07-11 LAB — URINALYSIS, ROUTINE W REFLEX MICROSCOPIC
Bacteria, UA: NONE SEEN
Bilirubin Urine: NEGATIVE
Glucose, UA: NEGATIVE mg/dL
Hgb urine dipstick: NEGATIVE
Ketones, ur: 20 mg/dL — AB
Leukocytes,Ua: NEGATIVE
Nitrite: NEGATIVE
Protein, ur: 30 mg/dL — AB
Specific Gravity, Urine: 1.025 (ref 1.005–1.030)
pH: 5 (ref 5.0–8.0)

## 2022-07-11 LAB — LIPASE, BLOOD: Lipase: 35 U/L (ref 11–51)

## 2022-07-11 LAB — PROTEIN / CREATININE RATIO, URINE
Creatinine, Urine: 207 mg/dL
Protein Creatinine Ratio: 0.2 mg/mg{Cre} — ABNORMAL HIGH (ref 0.00–0.15)
Total Protein, Urine: 41 mg/dL

## 2022-07-11 MED ORDER — SODIUM CHLORIDE 0.9 % IV SOLN
8.0000 mg | Freq: Once | INTRAVENOUS | Status: AC
  Administered 2022-07-11: 8 mg via INTRAVENOUS
  Filled 2022-07-11: qty 4

## 2022-07-11 MED ORDER — HYDROMORPHONE HCL 1 MG/ML IJ SOLN
1.0000 mg | Freq: Once | INTRAMUSCULAR | Status: AC
  Administered 2022-07-11: 1 mg via INTRAVENOUS
  Filled 2022-07-11: qty 1

## 2022-07-11 MED ORDER — AMOXICILLIN-POT CLAVULANATE 500-125 MG PO TABS: 1.0000 | ORAL_TABLET | Freq: Two times a day (BID) | ORAL | 0 refills | Status: AC

## 2022-07-11 MED ORDER — LACTATED RINGERS IV BOLUS
1000.0000 mL | Freq: Once | INTRAVENOUS | Status: AC
  Administered 2022-07-11: 1000 mL via INTRAVENOUS

## 2022-07-11 MED ORDER — OXYCODONE HCL 5 MG PO TABS: 5.0000 mg | ORAL_TABLET | Freq: Four times a day (QID) | ORAL | 0 refills | Status: DC | PRN

## 2022-07-11 MED ORDER — FAMOTIDINE IN NACL 20-0.9 MG/50ML-% IV SOLN
20.0000 mg | Freq: Once | INTRAVENOUS | Status: AC
  Administered 2022-07-11: 20 mg via INTRAVENOUS
  Filled 2022-07-11: qty 50

## 2022-07-11 NOTE — Consult Note (Signed)
Consult Note  Susan Branch 1996-01-08  [id number].    Requesting MD: Dr. Harrington Challenger Chief Complaint/Reason for Consult: abdominal pain  HPI:  26 y.o. female with medical history significant for borderline hypertension, gestational diabetes, and obesity who is [redacted] weeks pregnant who presented to [facility] MAU with abdominal pain. States that she woke up around 5am with acute onset RUQ abdominal pain associated with nausea and vomiting. This began after eating a [place] sandwich earlier. States that she had one similar episode about 1 month ago, but symptoms did not last as long and resolved without intervention. She denies fever or chills. Since presentation to the MAU she has received IVF, pepcid, and pain medication with some improvement.  Work up in the MAU significant for US showing cholelithiasis with sludge with a positive sonographic murphy sign. She has WBC of 16. Afebrile, VSS. General surgery asked to see.  Nonsmoker Denies alcohol or illicit drug use Blood thinners: none Past Surgeries: no prior abdominal surgeries  ROS: Reviewed and negative except otherwise as above.  Family History  Problem Relation Age of Onset   Diabetes Mother    Depression Father    Diabetes Father    Hypertension Father    Heart disease Father        MI   Hyperlipidemia Father    Kidney disease Father    Vision loss Father    Bipolar disorder Father    Vision loss Maternal Grandmother    Stroke Maternal Grandmother    Diabetes Paternal Grandmother    Kidney failure Paternal Grandmother    Diabetes Paternal Grandfather    Kidney failure Paternal Grandfather    Muscular dystrophy Paternal Grandfather     Past Medical History:  Diagnosis Date   Allergy    Anxiety    Borderline hypertension    no current med.   Headache    Tonsillar and adenoid hypertrophy 05/2013   snores during sleep, mother denies apnea    Past Surgical History:  Procedure Laterality Date   TONSILLECTOMY AND  ADENOIDECTOMY N/A 06/02/2013   Procedure: TONSILLECTOMY AND ADENOIDECTOMY;  Surgeon: Ascencion Dike, MD;  Location: [place];  Service: ENT;  Laterality: N/A;   TURBINATE REDUCTION Bilateral 06/02/2013   Procedure: TURBINATE REDUCTION BILATERAL;  Surgeon: Ascencion Dike, MD;  Location: [place];  Service: ENT;  Laterality: Bilateral;    Social History:  reports that she has never smoked. She has been exposed to tobacco smoke. She has never used smokeless tobacco. She reports that she does not currently use drugs after having used the following drugs: Marijuana. Frequency: 4.00 times per week. She reports that she does not drink alcohol.  Allergies:  Allergies  Allergen Reactions   Adhesive [Tape] Rash   Latex Rash   Soap Rash    Medications Prior to Admission  Medication Sig Dispense Refill   aspirin 81 MG chewable tablet Chew 2 tablets (162 mg total) by mouth daily. 60 tablet 7   metFORMIN (GLUCOPHAGE) 500 MG tablet Take 500 mg by mouth daily.     Prenatal Vit-Fe Fumarate-FA (MULTIVITAMIN-PRENATAL) 27-0.8 MG TABS tablet Take 1 tablet by mouth daily at 12 noon.     cetirizine (ZYRTEC ALLERGY) 10 MG tablet Take 1 tablet (10 mg total) by mouth daily. 30 tablet 2   fluticasone (FLONASE) 50 MCG/ACT nasal spray Place 1 spray into both nostrils 2 (two) times daily. 16 g 2    Blood pressure Marland Kitchen)  147/83, pulse 100, temperature 97.8 F (36.6 C), temperature source Oral, resp. rate 18, height 5\' 3"  (1.6 m), weight 103 kg, last menstrual period 11/02/2021, SpO2 98 %. Physical Exam: General: pleasant, WD, female who is laying in bed in NAD HEENT: head is normocephalic, atraumatic.  Sclera are noninjected.  Pupils equal and round. EOMs intact.  Ears and nose without any masses or lesions.  Mouth is pink and moist Heart: regular, rate, and rhythm Lungs: CTAB, no wheezes, rhonchi, or rales noted.  Respiratory effort nonlabored on room air Abd: Gravid uterus. soft, ND, +BS, no  masses, hernias, or organomegaly. Mild focal TTP in right upper quadrant without rebound or guarding MSK: all 4 extremities are symmetrical with no cyanosis, clubbing, or edema. Skin: warm and dry with no masses, lesions, or rashes Neuro: Cranial nerves 2-12 grossly intact, sensation is normal throughout Psych: A&Ox3 with an appropriate affect.    Results for orders placed or performed during the hospital encounter of 07/11/22 (from the past 48 hour(s))  Urinalysis, Routine w reflex microscopic Urine, Clean Catch     Status: Abnormal   Collection Time: 07/11/22  9:46 AM  Result Value Ref Range   Color, Urine YELLOW YELLOW   APPearance HAZY (A) CLEAR   Specific Gravity, Urine 1.025 1.005 - 1.030   pH 5.0 5.0 - 8.0   Glucose, UA NEGATIVE NEGATIVE mg/dL   Hgb urine dipstick NEGATIVE NEGATIVE   Bilirubin Urine NEGATIVE NEGATIVE   Ketones, ur 20 (A) NEGATIVE mg/dL   Protein, ur 30 (A) NEGATIVE mg/dL   Nitrite NEGATIVE NEGATIVE   Leukocytes,Ua NEGATIVE NEGATIVE   RBC / HPF 11-20 0 - 5 RBC/hpf   WBC, UA 0-5 0 - 5 WBC/hpf   Bacteria, UA NONE SEEN NONE SEEN   Squamous Epithelial / LPF 6-10 0 - 5   Mucus PRESENT    Ca Oxalate Crys, UA PRESENT     Comment: Performed at [facility], [address] [place], [place] [address]  Comprehensive metabolic panel     Status: Abnormal   Collection Time: 07/11/22  9:58 AM  Result Value Ref Range   Sodium 137 135 - 145 mmol/L   Potassium 4.0 3.5 - 5.1 mmol/L   Chloride 104 98 - 111 mmol/L   CO2 19 (L) 22 - 32 mmol/L   Glucose, Bld 134 (H) 70 - 99 mg/dL    Comment: Glucose reference range applies only to samples taken after fasting for at least 8 hours.   BUN 7 6 - 20 mg/dL   Creatinine, Ser 0.69 0.44 - 1.00 mg/dL   Calcium 10.1 8.9 - 10.3 mg/dL   Total Protein 7.3 6.5 - 8.1 g/dL   Albumin 3.2 (L) 3.5 - 5.0 g/dL   AST 21 15 - 41 U/L   ALT 22 0 - 44 U/L   Alkaline Phosphatase 125 38 - 126 U/L   Total Bilirubin 0.5 0.3 - 1.2 mg/dL   GFR,  Estimated >60 >60 mL/min    Comment: (NOTE) Calculated using the CKD-EPI Creatinine Equation (2021)    Anion gap 14 5 - 15    Comment: Performed at [facility] [address] [place], [place] [address]  CBC with Differential/Platelet     Status: Abnormal   Collection Time: 07/11/22  9:58 AM  Result Value Ref Range   WBC 16.0 (H) 4.0 - 10.5 K/uL   RBC 4.30 3.87 - 5.11 MIL/uL   Hemoglobin 12.7 12.0 - 15.0 g/dL  HCT 35.9 (L) 36.0 - 46.0 %   MCV 83.5 80.0 - 100.0 fL   MCH 29.5 26.0 - 34.0 pg   MCHC 35.4 30.0 - 36.0 g/dL   RDW 13.0 11.5 - 15.5 %   Platelets 296 150 - 400 K/uL   nRBC 0.0 0.0 - 0.2 %   Neutrophils Relative % 89 %   Neutro Abs 14.3 (H) 1.7 - 7.7 K/uL   Lymphocytes Relative 7 %   Lymphs Abs 1.1 0.7 - 4.0 K/uL   Monocytes Relative 3 %   Monocytes Absolute 0.4 0.1 - 1.0 K/uL   Eosinophils Relative 0 %   Eosinophils Absolute 0.0 0.0 - 0.5 K/uL   Basophils Relative 0 %   Basophils Absolute 0.0 0.0 - 0.1 K/uL   Immature Granulocytes 1 %   Abs Immature Granulocytes 0.11 (H) 0.00 - 0.07 K/uL    Comment: Performed at [facility] [address] [place], [place] [address]  Lipase, blood     Status: None   Collection Time: 07/11/22  9:58 AM  Result Value Ref Range   Lipase 35 11 - 51 U/L    Comment: Performed at [facility] [address] [place], [place] [address]   US Abdomen Limited RUQ (LIVER/GB)  Result Date: 07/11/2022 CLINICAL DATA:  Right upper quadrant pain. EXAM: ULTRASOUND ABDOMEN LIMITED RIGHT UPPER QUADRANT COMPARISON:  None Available. FINDINGS: Gallbladder: Multiple small calculi and sludge. No wall thickening visualized. Positive sonographic Murphy sign noted by sonographer. Common bile duct: Diameter: 4 mm, normal. Liver: No focal lesion identified. Within normal limits in parenchymal echogenicity. Portal vein is patent on color Doppler imaging with normal direction of blood flow towards the liver. Other: None. IMPRESSION: 1.  Cholelithiasis and sludge with a positive sonographic Murphy sign noted by sonographer. Findings are equivocal for acute cholecystitis. Electronically Signed   By: Titus Dubin M.D.   On: 07/11/2022 10:41      Assessment/Plan RUQ pain, cholelithiasis, ?Cholecystitis G1P0000, [redacted] weeks gestation Patient seen and examined and relevant labs and imaging reviewed.  History, physical exam and work-up consistent with symptomatic cholelithiasis and possible cholecystitis.  LFTs and lipase are within normal limits.  Given her current pregnancy do not recommend operative intervention at this time but instead medical management with diet modification and pain control.  Given her leukocytosis of 16 and positive Murphy sign on u/s also recommend antibiotic coverage for 1 week.  Recommend PO challenge in MAU prior to discharge to ensure that symptoms do not recur. She should follow-up in our clinic post delivery for reevaluation and and discussion of laparoscopic cholecystectomy.  FEN: rec low fat diet ID: none currently VTE: none  I reviewed last 24 h vitals and pain scores, last 48 h intake and output, last 24 h labs and trends, and last 24 h imaging results  Margie Billet, [facility] 07/11/2022, 1:41 PM Please see Amion for pager number during day hours 7:00am-4:30pm

## 2022-07-11 NOTE — MAU Note (Addendum)
...  Susan Branch is a 26 y.o. at [redacted]w[redacted]d here in MAU reporting: Constant upper abdominal pain since 0530 this morning that feels like "pressure" and is "sore." She reports the has vomited twice today and believes it is due to her pain. She is also endorsing bilateral lower abdominal pain that occurs with movement. Has not taken any medications for the pain. Denies VB or LOF. +FM. Last IC last night around midnight.  Onset of complaint: 0530 this morning Pain score:  8/10 upper abdominal 5/10 bilateral lower abdomen  FHT: 135 initial external Lab orders placed from triage:  UA

## 2022-07-11 NOTE — MAU Provider Note (Signed)
History     CSN: [id number]  Arrival date and time: 07/11/22 5015   Event Date/Time   First Provider Initiated Contact with Patient 07/11/22 [phone]      Chief Complaint  Patient presents with   Abdominal Pain   HPI  Susan Branch is a 26 y.o. female G1P0000 @ [redacted]w[redacted]d  here with abdominal pain. This is the 2nd episode of abdominal pain in the pregnancy. She was seen at 23 weeks for this pain. She reports the pain this morning started at 0500.   9 PM last night she ate a Sandwhich and salad for dinner. Vomited 8 times throughout the night following eating. She reports the pain is all over her abdomen, worse in the upper abdomen. She has no bleeding. + fetal movement.   OB History     Gravida  1   Para  0   Term  0   Preterm  0   AB  0   Living  0      SAB  0   IAB  0   Ectopic  0   Multiple  0   Live Births  0           Past Medical History:  Diagnosis Date   Allergy    Anxiety    Borderline hypertension    no current med.   Headache    Tonsillar and adenoid hypertrophy 05/2013   snores during sleep, mother denies apnea    Past Surgical History:  Procedure Laterality Date   TONSILLECTOMY AND ADENOIDECTOMY N/A 06/02/2013   Procedure: TONSILLECTOMY AND ADENOIDECTOMY;  Surgeon: [name], MD;  Location: [facility];  Service: ENT;  Laterality: N/A;   TURBINATE REDUCTION Bilateral 06/02/2013   Procedure: TURBINATE REDUCTION BILATERAL;  Surgeon: [name], MD;  [facility];  Service: ENT;  Laterality: Bilateral;    Family History  Problem Relation Age of Onset   Diabetes Mother    Depression Father    Diabetes Father    Hypertension Father    Heart disease Father        MI   Hyperlipidemia Father    Kidney disease Father    Vision loss Father    Bipolar disorder Father    Vision loss Maternal Grandmother    Stroke Maternal Grandmother    Diabetes Paternal Grandmother    Kidney failure Paternal  Grandmother    Diabetes Paternal Grandfather    Kidney failure Paternal Grandfather    Muscular dystrophy Paternal Grandfather     Social History   Tobacco Use   Smoking status: Never    Passive exposure: Yes   Smokeless tobacco: Never  Vaping Use   Vaping Use: Never used  Substance Use Topics   Alcohol use: No   Drug use: Not Currently    Frequency: 4.0 times per week    Types: Marijuana    Allergies:  Allergies  Allergen Reactions   Adhesive [Tape] Rash   Latex Rash   Soap Rash    No medications prior to admission.   Results for orders placed or performed during the hospital encounter of 07/11/22 (from the past 48 hour(s))  Urinalysis, Routine w reflex microscopic Urine, Clean Catch     Status: Abnormal   Collection Time: 07/11/22  9:46 AM  Result Value Ref Range   Color, Urine YELLOW YELLOW   APPearance HAZY (A) CLEAR   Specific Gravity, Urine 1.025 1.005 - 1.030  pH 5.0 5.0 - 8.0   Glucose, UA NEGATIVE NEGATIVE mg/dL   Hgb urine dipstick NEGATIVE NEGATIVE   Bilirubin Urine NEGATIVE NEGATIVE   Ketones, ur 20 (A) NEGATIVE mg/dL   Protein, ur 30 (A) NEGATIVE mg/dL   Nitrite NEGATIVE NEGATIVE   Leukocytes,Ua NEGATIVE NEGATIVE   RBC / HPF 11-20 0 - 5 RBC/hpf   WBC, UA 0-5 0 - 5 WBC/hpf   Bacteria, UA NONE SEEN NONE SEEN   Squamous Epithelial / LPF 6-10 0 - 5   Mucus PRESENT    Ca Oxalate Crys, UA PRESENT     Comment: Performed at [facility], [place] [address], [place], [place] [address]  Protein / creatinine ratio, urine     Status: Abnormal   Collection Time: 07/11/22  9:46 AM  Result Value Ref Range   Creatinine, Urine 207 mg/dL   Total Protein, Urine 41 mg/dL    Comment: NO NORMAL RANGE ESTABLISHED FOR THIS TEST   Protein Creatinine Ratio 0.20 (H) 0.00 - 0.15 mg/mg[Cre]    Comment: Performed at [facility] [address] [place], [place] [address]  Comprehensive metabolic panel     Status: Abnormal   Collection Time: 07/11/22  9:58 AM   Result Value Ref Range   Sodium 137 135 - 145 mmol/L   Potassium 4.0 3.5 - 5.1 mmol/L   Chloride 104 98 - 111 mmol/L   CO2 19 (L) 22 - 32 mmol/L   Glucose, Bld 134 (H) 70 - 99 mg/dL    Comment: Glucose reference range applies only to samples taken after fasting for at least 8 hours.   BUN 7 6 - 20 mg/dL   Creatinine, Ser 0.69 0.44 - 1.00 mg/dL   Calcium 10.1 8.9 - 10.3 mg/dL   Total Protein 7.3 6.5 - 8.1 g/dL   Albumin 3.2 (L) 3.5 - 5.0 g/dL   AST 21 15 - 41 U/L   ALT 22 0 - 44 U/L   Alkaline Phosphatase 125 38 - 126 U/L   Total Bilirubin 0.5 0.3 - 1.2 mg/dL   GFR, Estimated >60 >60 mL/min    Comment: (NOTE) Calculated using the CKD-EPI Creatinine Equation (2021)    Anion gap 14 5 - 15    Comment: Performed at [facility] [address] [place], [place] [address]  CBC with Differential/Platelet     Status: Abnormal   Collection Time: 07/11/22  9:58 AM  Result Value Ref Range   WBC 16.0 (H) 4.0 - 10.5 K/uL   RBC 4.30 3.87 - 5.11 MIL/uL   Hemoglobin 12.7 12.0 - 15.0 g/dL   HCT 35.9 (L) 36.0 - 46.0 %   MCV 83.5 80.0 - 100.0 fL   MCH 29.5 26.0 - 34.0 pg   MCHC 35.4 30.0 - 36.0 g/dL   RDW 13.0 11.5 - 15.5 %   Platelets 296 150 - 400 K/uL   nRBC 0.0 0.0 - 0.2 %   Neutrophils Relative % 89 %   Neutro Abs 14.3 (H) 1.7 - 7.7 K/uL   Lymphocytes Relative 7 %   Lymphs Abs 1.1 0.7 - 4.0 K/uL   Monocytes Relative 3 %   Monocytes Absolute 0.4 0.1 - 1.0 K/uL   Eosinophils Relative 0 %   Eosinophils Absolute 0.0 0.0 - 0.5 K/uL   Basophils Relative 0 %   Basophils Absolute 0.0 0.0 - 0.1 K/uL   Immature Granulocytes 1 %   Abs Immature Granulocytes 0.11 (H) 0.00 -  0.07 K/uL    Comment: Performed at [facility], [place] [address] [place], [place] [address]  Lipase, blood     Status: None   Collection Time: 07/11/22  9:58 AM  Result Value Ref Range   Lipase 35 11 - 51 U/L    Comment: Performed at [facility] [address] [place], [place] [address]    US  Abdomen Limited RUQ (LIVER/GB)  Result Date: 07/11/2022 CLINICAL DATA:  Right upper quadrant pain. EXAM: ULTRASOUND ABDOMEN LIMITED RIGHT UPPER QUADRANT COMPARISON:  None Available. FINDINGS: Gallbladder: Multiple small calculi and sludge. No wall thickening visualized. Positive sonographic Murphy sign noted by sonographer. Common bile duct: Diameter: 4 mm, normal. Liver: No focal lesion identified. Within normal limits in parenchymal echogenicity. Portal vein is patent on color Doppler imaging with normal direction of blood flow towards the liver. Other: None. IMPRESSION: 1. Cholelithiasis and sludge with a positive sonographic Murphy sign noted by sonographer. Findings are equivocal for acute cholecystitis. Electronically Signed   By: Titus Dubin M.D.   On: 07/11/2022 10:41     Review of Systems  Gastrointestinal:  Positive for abdominal pain, nausea and vomiting. Negative for diarrhea.  Genitourinary:  Negative for vaginal bleeding and vaginal discharge.   Physical Exam   Blood pressure 137/78, pulse 95, temperature 97.8 F (36.6 C), temperature source Oral, resp. rate 18, height 5\' 3"  (1.6 m), weight 103 kg, last menstrual period 11/02/2021, SpO2 98 %.  Patient Vitals for the past 24 hrs:  BP Temp Temp src Pulse Resp SpO2 Height Weight  07/11/22 1409 137/78 -- -- 95 -- -- -- --  07/11/22 1321 (!) 147/83 97.8 F (36.6 C) Oral 100 18 98 % -- --  07/11/22 0955 -- -- -- -- -- 99 % -- --  07/11/22 0950 -- -- -- -- -- 100 % -- --  07/11/22 0945 -- -- -- -- -- 99 % -- --  07/11/22 0940 -- -- -- -- -- 99 % -- --  07/11/22 0935 -- -- -- -- -- 100 % -- --  07/11/22 0930 -- -- -- -- -- 99 % -- --  07/11/22 0927 128/72 97.6 F (36.4 C) Oral 90 -- 99 % -- --  07/11/22 0921 -- -- -- -- -- -- 5\' 3"  (1.6 m) 103 kg     .  Physical Exam Constitutional:      General: She is not in acute distress.    Appearance: Normal appearance. She is well-developed. She is obese. She is not ill-appearing,  toxic-appearing or diaphoretic.  Abdominal:     Tenderness: There is abdominal tenderness in the right upper quadrant. There is no guarding or rebound. Positive signs include Murphy's sign.  Skin:    General: Skin is warm.  Neurological:     Mental Status: She is alert and oriented to person, place, and time.     Deep Tendon Reflexes: Reflexes normal.     MAU Course  Procedures  MDM  Lr bolus RUQ [place] ordered  General surgery came down to see patient in MAU.  Recommendations given.  One elevated BP in MAU, subsequent normal.  CBC, CMP, PCR  Assessment and Plan   A:  1. Cholelithiasis affecting pregnancy in third trimester, antepartum   2. Leukocytosis, unspecified type   3. Nausea and vomiting, unspecified vomiting type   4. [redacted] weeks gestation of pregnancy      P:  Dc home  Rx: Augmentin, Roxicodone  Strict return  precautions Bland diet.  Plan for close follow up with general surgery following delivery.   Noni Saupe I, NP 07/17/2022 8:26 AM

## 2022-07-19 DIAGNOSIS — O24419 Gestational diabetes mellitus in pregnancy, unspecified control: Secondary | ICD-10-CM | POA: Diagnosis not present

## 2022-07-19 DIAGNOSIS — Z3A35 35 weeks gestation of pregnancy: Secondary | ICD-10-CM | POA: Diagnosis not present

## 2022-07-25 DIAGNOSIS — O24410 Gestational diabetes mellitus in pregnancy, diet controlled: Secondary | ICD-10-CM | POA: Diagnosis not present

## 2022-07-25 DIAGNOSIS — Z3A36 36 weeks gestation of pregnancy: Secondary | ICD-10-CM | POA: Diagnosis not present

## 2022-07-31 DIAGNOSIS — Z3A36 36 weeks gestation of pregnancy: Secondary | ICD-10-CM | POA: Diagnosis not present

## 2022-07-31 DIAGNOSIS — O24419 Gestational diabetes mellitus in pregnancy, unspecified control: Secondary | ICD-10-CM | POA: Diagnosis not present

## 2022-07-31 DIAGNOSIS — O24415 Gestational diabetes mellitus in pregnancy, controlled by oral hypoglycemic drugs: Secondary | ICD-10-CM | POA: Diagnosis not present

## 2022-08-08 DIAGNOSIS — Z3A38 38 weeks gestation of pregnancy: Secondary | ICD-10-CM | POA: Diagnosis not present

## 2022-08-08 DIAGNOSIS — O24415 Gestational diabetes mellitus in pregnancy, controlled by oral hypoglycemic drugs: Secondary | ICD-10-CM | POA: Diagnosis not present

## 2022-08-09 ENCOUNTER — Telehealth (HOSPITAL_COMMUNITY): Payer: Self-pay | Admitting: *Deleted

## 2022-08-09 ENCOUNTER — Encounter (HOSPITAL_COMMUNITY): Payer: Self-pay

## 2022-08-09 NOTE — Telephone Encounter (Signed)
Preadmission screen  

## 2022-08-10 ENCOUNTER — Telehealth (HOSPITAL_COMMUNITY): Payer: Self-pay | Admitting: *Deleted

## 2022-08-10 NOTE — Telephone Encounter (Signed)
Preadmission screen  

## 2022-08-13 ENCOUNTER — Telehealth (HOSPITAL_COMMUNITY): Payer: Self-pay | Admitting: *Deleted

## 2022-08-13 ENCOUNTER — Encounter (HOSPITAL_COMMUNITY): Payer: Self-pay | Admitting: *Deleted

## 2022-08-13 NOTE — Telephone Encounter (Signed)
Preadmission screenPreadmission screen 

## 2022-08-14 NOTE — H&P (Signed)
Susan Branch is a 26 y.o. prime female presenting for IOL at  11 0/[redacted] wks gestation. She is dated by LMP which was confirmed with a 5 week [place]. Her pregnancy has been complicated by: GDM- moderate contro on metformin 500mg  po bid CF carrier - FOB negative .GBS + in urine in first trimester  She received her flu shot 05/24/22 OB History     Gravida  1   Para  0   Term  0   Preterm  0   AB  0   Living  0      SAB  0   IAB  0   Ectopic  0   Multiple  0   Live Births  0          Past Medical History:  Diagnosis Date  . Allergy   . Anxiety   . Borderline hypertension    no current med.  . Diabetes mellitus without complication (HCC)   . Gestational diabetes   . Headache   . Tonsillar and adenoid hypertrophy 05/2013   snores during sleep, mother denies apnea   Past Surgical History:  Procedure Laterality Date  . TONSILLECTOMY AND ADENOIDECTOMY N/A 06/02/2013   Procedure: TONSILLECTOMY AND ADENOIDECTOMY;  Surgeon: 06/04/2013, MD;  Location: [facility];  Service: ENT;  Laterality: N/A;  . TURBINATE REDUCTION Bilateral 06/02/2013   Procedure: TURBINATE REDUCTION BILATERAL;  Surgeon: 06/04/2013, MD;  Location: [facility];  Service: ENT;  Laterality: Bilateral;   Family History: family history includes Bipolar disorder in her father; Depression in her father; Diabetes in her father, mother, paternal grandfather, and paternal grandmother; Heart disease in her father; Hyperlipidemia in her father; Hypertension in her father; Kidney disease in her father; Kidney failure in her paternal grandfather and paternal grandmother; Muscular dystrophy in her paternal grandfather; Stroke in her maternal grandmother; Vision loss in her father and maternal grandmother. Social History:  reports that she has never smoked. She has been exposed to tobacco smoke. She has never used smokeless tobacco. She reports that she does not currently use drugs after having  used the following drugs: Marijuana. Frequency: 4.00 times per week. She reports that she does not drink alcohol.     Maternal Diabetes: Yes:  Diabetes Type:  Insulin/Medication controlled Genetic Screening: Normal Maternal Ultrasounds/Referrals: Normal Fetal Ultrasounds or other Referrals:  None Maternal Substance Abuse:  No Significant Maternal Medications:  None Significant Maternal Lab Results:  Group B Strep positive Number of Prenatal Visits:greater than 3 verified prenatal visits Other Comments:  None  Review of Systems  Constitutional:  Positive for fatigue. Negative for activity change.  Eyes:  Negative for photophobia and visual disturbance.  Respiratory:  Negative for chest tightness and shortness of breath.   Cardiovascular:  Positive for leg swelling. Negative for chest pain and palpitations.  Gastrointestinal:  Negative for abdominal pain.  Genitourinary:  Positive for pelvic pain. Negative for vaginal bleeding.  Musculoskeletal:  Negative for back pain.  Neurological:  Negative for light-headedness, numbness and headaches.  Psychiatric/Behavioral:  The patient is nervous/anxious.    Maternal Medical History:  Reason for admission: Scheduled IOL due to GDMA2  Contractions: Frequency: rare and irregular.   Perceived severity is mild.   Fetal activity: Perceived fetal activity is normal.   Last perceived fetal movement was within the past hour.   Prenatal complications: no prenatal complications Prenatal Complications - Diabetes: gestational. Diabetes is managed by oral agent (monotherapy).  Last menstrual period 11/02/2021. Maternal Exam:  Uterine Assessment: Contraction strength is mild.  Contraction frequency is rare and irregular.  Abdomen: Patient reports no abdominal tenderness. Estimated fetal weight is AGA.   Fetal presentation: vertex Introitus: Normal vulva. Vulva is negative for condylomata and lesion.  Normal vagina.  Vagina is negative for  condylomata.  Pelvis: adequate for delivery.   Cervix: Cervix evaluated by digital exam.    Physical Exam Vitals and nursing note reviewed. Exam conducted with a chaperone present.  Constitutional:      Appearance: Normal appearance.  Cardiovascular:     Rate and Rhythm: Normal rate.  Pulmonary:     Effort: Pulmonary effort is normal.  Genitourinary:    General: Normal vulva.  Vulva is no lesion.  Musculoskeletal:        General: Normal range of motion.     Cervical back: Normal range of motion.  Skin:    General: Skin is warm and dry.     Capillary Refill: Capillary refill takes 2 to 3 seconds.  Neurological:     General: No focal deficit present.     Mental Status: She is alert and oriented to person, place, and time. Mental status is at baseline.  Psychiatric:        Mood and Affect: Mood normal.        Behavior: Behavior normal.        Thought Content: Thought content normal.        Judgment: Judgment normal.    Prenatal labs: ABO, Rh: B/Positive/-- (06/07 1348) Antibody: Negative (06/07 1348) Rubella: 7.26 (06/07 1348) RPR: Non Reactive (06/07 1348)  HBsAg: Negative (06/07 1348)  HIV: Non Reactive (06/07 1348)  GBS: Positive/-- (06/07 0000)   Assessment/Plan: 26yo prime at 45 0/[redacted]wks gestation for IOL due to [place] : ripen overnight w/vaginal cytotec - GBS +; treat w/ PCN in active labor - Pain control prn - Anticipate svd     Venetia Night Daleah Coulson 08/15/2022, 12:33 AM

## 2022-08-15 ENCOUNTER — Other Ambulatory Visit: Payer: Self-pay

## 2022-08-15 ENCOUNTER — Inpatient Hospital Stay (HOSPITAL_COMMUNITY): Payer: 59 | Admitting: Anesthesiology

## 2022-08-15 ENCOUNTER — Inpatient Hospital Stay (HOSPITAL_COMMUNITY): Payer: 59

## 2022-08-15 ENCOUNTER — Inpatient Hospital Stay (HOSPITAL_COMMUNITY)
Admission: AD | Admit: 2022-08-15 | Discharge: 2022-08-18 | DRG: 788 | Disposition: A | Discharge status: Home / Self Care | Payer: 59 | Attending: Obstetrics and Gynecology | Admitting: Obstetrics and Gynecology

## 2022-08-15 ENCOUNTER — Encounter (HOSPITAL_COMMUNITY): Payer: Self-pay | Admitting: Obstetrics and Gynecology

## 2022-08-15 ENCOUNTER — Encounter (HOSPITAL_COMMUNITY)
Admission: AD | Disposition: A | Discharge status: Home / Self Care | Payer: Self-pay | Source: Home / Self Care | Attending: Obstetrics and Gynecology

## 2022-08-15 DIAGNOSIS — Z3A39 39 weeks gestation of pregnancy: Secondary | ICD-10-CM

## 2022-08-15 DIAGNOSIS — O24429 Gestational diabetes mellitus in childbirth, unspecified control: Secondary | ICD-10-CM | POA: Diagnosis not present

## 2022-08-15 DIAGNOSIS — O99824 Streptococcus B carrier state complicating childbirth: Secondary | ICD-10-CM | POA: Diagnosis present

## 2022-08-15 DIAGNOSIS — Z141 Cystic fibrosis carrier: Secondary | ICD-10-CM

## 2022-08-15 DIAGNOSIS — O134 Gestational [pregnancy-induced] hypertension without significant proteinuria, complicating childbirth: Secondary | ICD-10-CM | POA: Diagnosis not present

## 2022-08-15 DIAGNOSIS — O26893 Other specified pregnancy related conditions, third trimester: Secondary | ICD-10-CM | POA: Diagnosis present

## 2022-08-15 DIAGNOSIS — O24425 Gestational diabetes mellitus in childbirth, controlled by oral hypoglycemic drugs: Secondary | ICD-10-CM | POA: Diagnosis present

## 2022-08-15 DIAGNOSIS — Z3490 Encounter for supervision of normal pregnancy, unspecified, unspecified trimester: Principal | ICD-10-CM | POA: Diagnosis present

## 2022-08-15 DIAGNOSIS — O24424 Gestational diabetes mellitus in childbirth, insulin controlled: Secondary | ICD-10-CM | POA: Diagnosis not present

## 2022-08-15 DIAGNOSIS — O99820 Streptococcus B carrier state complicating pregnancy: Secondary | ICD-10-CM | POA: Diagnosis not present

## 2022-08-15 LAB — GLUCOSE, CAPILLARY
Glucose-Capillary: 133 mg/dL — ABNORMAL HIGH (ref 70–99)
Glucose-Capillary: 174 mg/dL — ABNORMAL HIGH (ref 70–99)
Glucose-Capillary: 78 mg/dL (ref 70–99)
Glucose-Capillary: 83 mg/dL (ref 70–99)
Glucose-Capillary: 85 mg/dL (ref 70–99)
Glucose-Capillary: 96 mg/dL (ref 70–99)

## 2022-08-15 LAB — CBC
HCT: 32.4 % — ABNORMAL LOW (ref 36.0–46.0)
HCT: 32.5 % — ABNORMAL LOW (ref 36.0–46.0)
Hemoglobin: 11.8 g/dL — ABNORMAL LOW (ref 12.0–15.0)
Hemoglobin: 11.4 g/dL — ABNORMAL LOW (ref 12.0–15.0)
MCH: 29.6 pg (ref 26.0–34.0)
MCH: 29.3 pg (ref 26.0–34.0)
MCHC: 36.4 g/dL — ABNORMAL HIGH (ref 30.0–36.0)
MCHC: 35.1 g/dL (ref 30.0–36.0)
MCV: 81.2 fL (ref 80.0–100.0)
MCV: 83.5 fL (ref 80.0–100.0)
Platelets: 243 10*3/uL (ref 150–400)
Platelets: 235 10*3/uL (ref 150–400)
RBC: 3.99 MIL/uL (ref 3.87–5.11)
RBC: 3.89 MIL/uL (ref 3.87–5.11)
RDW: 13.5 % (ref 11.5–15.5)
RDW: 13.5 % (ref 11.5–15.5)
WBC: 12 10*3/uL — ABNORMAL HIGH (ref 4.0–10.5)
WBC: 11.1 10*3/uL — ABNORMAL HIGH (ref 4.0–10.5)
nRBC: 0 % (ref 0.0–0.2)
nRBC: 0 % (ref 0.0–0.2)

## 2022-08-15 LAB — TYPE AND SCREEN
ABO/RH(D): B POS
Antibody Screen: NEGATIVE

## 2022-08-15 LAB — RPR: RPR Ser Ql: NONREACTIVE

## 2022-08-15 SURGERY — Surgical Case
Anesthesia: Epidural

## 2022-08-15 MED ORDER — OXYCODONE-ACETAMINOPHEN 5-325 MG PO TABS: 2.0000 | ORAL_TABLET | ORAL | Status: DC | PRN

## 2022-08-15 MED ORDER — COCONUT OIL OIL: 1.0000 | TOPICAL_OIL | Status: DC | PRN

## 2022-08-15 MED ORDER — WITCH HAZEL-GLYCERIN EX PADS: 1.0000 | MEDICATED_PAD | CUTANEOUS | Status: DC | PRN

## 2022-08-15 MED ORDER — PHENYLEPHRINE 80 MCG/ML (10ML) SYRINGE FOR IV PUSH (FOR BLOOD PRESSURE SUPPORT)
PREFILLED_SYRINGE | INTRAVENOUS | Status: AC
  Filled 2022-08-15: qty 10

## 2022-08-15 MED ORDER — PENICILLIN G POT IN DEXTROSE 60000 UNIT/ML IV SOLN
3.0000 10*6.[IU] | INTRAVENOUS | Status: DC
  Administered 2022-08-15 (×2): 3 10*6.[IU] via INTRAVENOUS
  Filled 2022-08-15 (×2): qty 50

## 2022-08-15 MED ORDER — SODIUM CHLORIDE 0.9 % IR SOLN
Status: DC | PRN
  Administered 2022-08-15 (×2): 1

## 2022-08-15 MED ORDER — OXYTOCIN-SODIUM CHLORIDE 30-0.9 UT/500ML-% IV SOLN
1.0000 m[IU]/min | INTRAVENOUS | Status: DC
  Administered 2022-08-15: 2 m[IU]/min via INTRAVENOUS

## 2022-08-15 MED ORDER — SOD CITRATE-CITRIC ACID 500-334 MG/5ML PO SOLN: 30.0000 mL | ORAL | Status: DC

## 2022-08-15 MED ORDER — LACTATED RINGERS IV SOLN: 500.0000 mL | INTRAVENOUS | Status: DC | PRN

## 2022-08-15 MED ORDER — POVIDONE-IODINE 10 % EX SWAB: 2.0000 | Freq: Once | CUTANEOUS | Status: DC

## 2022-08-15 MED ORDER — ONDANSETRON HCL 4 MG/2ML IJ SOLN: 4.0000 mg | Freq: Four times a day (QID) | INTRAMUSCULAR | Status: DC | PRN

## 2022-08-15 MED ORDER — SENNOSIDES-DOCUSATE SODIUM 8.6-50 MG PO TABS
2.0000 | ORAL_TABLET | Freq: Every day | ORAL | Status: DC
  Administered 2022-08-16 – 2022-08-18 (×3): 2 via ORAL
  Filled 2022-08-15 (×3): qty 2

## 2022-08-15 MED ORDER — EPHEDRINE 5 MG/ML INJ: 10.0000 mg | INTRAVENOUS | Status: DC | PRN

## 2022-08-15 MED ORDER — ACETAMINOPHEN 10 MG/ML IV SOLN
INTRAVENOUS | Status: AC
  Filled 2022-08-15: qty 200

## 2022-08-15 MED ORDER — LACTATED RINGERS IV SOLN: INTRAVENOUS | Status: DC

## 2022-08-15 MED ORDER — MENTHOL 3 MG MT LOZG: 1.0000 | LOZENGE | OROMUCOSAL | Status: DC | PRN

## 2022-08-15 MED ORDER — LIDOCAINE-EPINEPHRINE (PF) 2 %-1:200000 IJ SOLN
INTRAMUSCULAR | Status: DC | PRN
  Administered 2022-08-15: 2 mL via EPIDURAL
  Administered 2022-08-15: 5 mL via EPIDURAL
  Administered 2022-08-15 (×2): 3 mL via EPIDURAL

## 2022-08-15 MED ORDER — SODIUM CHLORIDE 0.9 % IV SOLN
INTRAVENOUS | Status: AC
  Filled 2022-08-15: qty 5

## 2022-08-15 MED ORDER — SODIUM CHLORIDE 0.9 % IV SOLN
5.0000 10*6.[IU] | Freq: Once | INTRAVENOUS | Status: AC
  Administered 2022-08-15: 5 10*6.[IU] via INTRAVENOUS
  Filled 2022-08-15: qty 5

## 2022-08-15 MED ORDER — PRENATAL MULTIVITAMIN CH
1.0000 | ORAL_TABLET | Freq: Every day | ORAL | Status: DC
  Administered 2022-08-16 – 2022-08-18 (×3): 1 via ORAL
  Filled 2022-08-15 (×3): qty 1

## 2022-08-15 MED ORDER — PHENYLEPHRINE 80 MCG/ML (10ML) SYRINGE FOR IV PUSH (FOR BLOOD PRESSURE SUPPORT): 80.0000 ug | PREFILLED_SYRINGE | INTRAVENOUS | Status: DC | PRN

## 2022-08-15 MED ORDER — FENTANYL CITRATE (PF) 100 MCG/2ML IJ SOLN
100.0000 ug | INTRAMUSCULAR | Status: DC | PRN
  Administered 2022-08-15 (×2): 100 ug via INTRAVENOUS
  Filled 2022-08-15 (×2): qty 2

## 2022-08-15 MED ORDER — OXYTOCIN-SODIUM CHLORIDE 30-0.9 UT/500ML-% IV SOLN
2.5000 [IU]/h | INTRAVENOUS | Status: DC
  Filled 2022-08-15: qty 500

## 2022-08-15 MED ORDER — ACETAMINOPHEN 325 MG PO TABS: 650.0000 mg | ORAL_TABLET | ORAL | Status: DC | PRN

## 2022-08-15 MED ORDER — ONDANSETRON HCL 4 MG/2ML IJ SOLN
INTRAMUSCULAR | Status: DC | PRN
  Administered 2022-08-15: 4 mg via INTRAVENOUS

## 2022-08-15 MED ORDER — OXYCODONE-ACETAMINOPHEN 5-325 MG PO TABS: 1.0000 | ORAL_TABLET | ORAL | Status: DC | PRN

## 2022-08-15 MED ORDER — OXYTOCIN-SODIUM CHLORIDE 30-0.9 UT/500ML-% IV SOLN
INTRAVENOUS | Status: AC
  Filled 2022-08-15: qty 500

## 2022-08-15 MED ORDER — FENTANYL-BUPIVACAINE-NACL 0.5-0.125-0.9 MG/250ML-% EP SOLN
12.0000 mL/h | EPIDURAL | Status: DC | PRN
  Administered 2022-08-15: 12 mL/h via EPIDURAL
  Filled 2022-08-15: qty 250

## 2022-08-15 MED ORDER — FENTANYL CITRATE (PF) 100 MCG/2ML IJ SOLN
INTRAMUSCULAR | Status: DC | PRN
  Administered 2022-08-15: 100 ug via EPIDURAL

## 2022-08-15 MED ORDER — ONDANSETRON HCL 4 MG/2ML IJ SOLN
INTRAMUSCULAR | Status: AC
  Filled 2022-08-15: qty 4

## 2022-08-15 MED ORDER — SIMETHICONE 80 MG PO CHEW: 80.0000 mg | CHEWABLE_TABLET | ORAL | Status: DC | PRN

## 2022-08-15 MED ORDER — ACETAMINOPHEN 500 MG PO TABS
1000.0000 mg | ORAL_TABLET | Freq: Four times a day (QID) | ORAL | Status: DC
  Administered 2022-08-15 – 2022-08-18 (×10): 1000 mg via ORAL
  Filled 2022-08-15 (×12): qty 2

## 2022-08-15 MED ORDER — LIDOCAINE HCL (PF) 1 % IJ SOLN
INTRAMUSCULAR | Status: DC | PRN
  Administered 2022-08-15: 3 mL via EPIDURAL
  Administered 2022-08-15: 5 mL via EPIDURAL

## 2022-08-15 MED ORDER — MORPHINE SULFATE (PF) 0.5 MG/ML IJ SOLN
INTRAMUSCULAR | Status: AC
  Filled 2022-08-15: qty 10

## 2022-08-15 MED ORDER — OXYTOCIN 10 UNIT/ML IJ SOLN
INTRAMUSCULAR | Status: AC
  Filled 2022-08-15: qty 4

## 2022-08-15 MED ORDER — PHENYLEPHRINE 80 MCG/ML (10ML) SYRINGE FOR IV PUSH (FOR BLOOD PRESSURE SUPPORT)
80.0000 ug | PREFILLED_SYRINGE | INTRAVENOUS | Status: DC | PRN
  Administered 2022-08-15: 80 ug via INTRAVENOUS
  Filled 2022-08-15: qty 10

## 2022-08-15 MED ORDER — SODIUM CHLORIDE 0.9 % IV SOLN
500.0000 mg | INTRAVENOUS | Status: AC
  Administered 2022-08-15: 500 mg via INTRAVENOUS

## 2022-08-15 MED ORDER — MISOPROSTOL 50MCG HALF TABLET
50.0000 ug | ORAL_TABLET | ORAL | Status: DC | PRN
  Administered 2022-08-15 (×2): 50 ug via VAGINAL
  Filled 2022-08-15 (×3): qty 1

## 2022-08-15 MED ORDER — INSULIN ASPART 100 UNIT/ML IJ SOLN: 0.0000 [IU] | INTRAMUSCULAR | Status: DC

## 2022-08-15 MED ORDER — DIPHENHYDRAMINE HCL 50 MG/ML IJ SOLN: 12.5000 mg | INTRAMUSCULAR | Status: DC | PRN

## 2022-08-15 MED ORDER — ENOXAPARIN SODIUM 60 MG/0.6ML IJ SOSY
60.0000 mg | PREFILLED_SYRINGE | INTRAMUSCULAR | Status: DC
  Administered 2022-08-16 – 2022-08-18 (×3): 60 mg via SUBCUTANEOUS
  Filled 2022-08-15 (×3): qty 0.6

## 2022-08-15 MED ORDER — OXYTOCIN BOLUS FROM INFUSION: 333.0000 mL | Freq: Once | INTRAVENOUS | Status: DC

## 2022-08-15 MED ORDER — CEFAZOLIN SODIUM-DEXTROSE 2-4 GM/100ML-% IV SOLN: 2.0000 g | INTRAVENOUS | Status: DC

## 2022-08-15 MED ORDER — IBUPROFEN 600 MG PO TABS
600.0000 mg | ORAL_TABLET | Freq: Four times a day (QID) | ORAL | Status: DC
  Administered 2022-08-15 – 2022-08-18 (×10): 600 mg via ORAL
  Filled 2022-08-15 (×11): qty 1

## 2022-08-15 MED ORDER — OXYCODONE HCL 5 MG PO TABS
5.0000 mg | ORAL_TABLET | ORAL | Status: DC | PRN
  Administered 2022-08-16: 5 mg via ORAL
  Administered 2022-08-17 – 2022-08-18 (×4): 10 mg via ORAL
  Filled 2022-08-15 (×2): qty 2
  Filled 2022-08-15: qty 1
  Filled 2022-08-15 (×2): qty 2

## 2022-08-15 MED ORDER — TERBUTALINE SULFATE 1 MG/ML IJ SOLN
0.2500 mg | Freq: Once | INTRAMUSCULAR | Status: AC | PRN
Start: 2022-08-15 — End: 2022-08-15
  Administered 2022-08-15: 0.25 mg via SUBCUTANEOUS

## 2022-08-15 MED ORDER — SIMETHICONE 80 MG PO CHEW
80.0000 mg | CHEWABLE_TABLET | Freq: Three times a day (TID) | ORAL | Status: DC
  Administered 2022-08-16 – 2022-08-18 (×7): 80 mg via ORAL
  Filled 2022-08-15 (×7): qty 1

## 2022-08-15 MED ORDER — LACTATED RINGERS IV SOLN: 500.0000 mL | Freq: Once | INTRAVENOUS | Status: DC

## 2022-08-15 MED ORDER — LIDOCAINE HCL (PF) 1 % IJ SOLN: 30.0000 mL | INTRAMUSCULAR | Status: DC | PRN

## 2022-08-15 MED ORDER — DIPHENHYDRAMINE HCL 25 MG PO CAPS
25.0000 mg | ORAL_CAPSULE | Freq: Four times a day (QID) | ORAL | Status: DC | PRN
  Administered 2022-08-15: 25 mg via ORAL
  Filled 2022-08-15: qty 1

## 2022-08-15 MED ORDER — ACETAMINOPHEN 10 MG/ML IV SOLN
INTRAVENOUS | Status: DC | PRN
  Administered 2022-08-15: 1000 mg via INTRAVENOUS

## 2022-08-15 MED ORDER — TERBUTALINE SULFATE 1 MG/ML IJ SOLN
0.2500 mg | Freq: Once | INTRAMUSCULAR | Status: AC | PRN
  Administered 2022-08-15: 0.25 mg via SUBCUTANEOUS
  Filled 2022-08-15: qty 1

## 2022-08-15 MED ORDER — DIBUCAINE (PERIANAL) 1 % EX OINT: 1.0000 | TOPICAL_OINTMENT | CUTANEOUS | Status: DC | PRN

## 2022-08-15 MED ORDER — SOD CITRATE-CITRIC ACID 500-334 MG/5ML PO SOLN
30.0000 mL | ORAL | Status: DC | PRN
  Administered 2022-08-15: 30 mL via ORAL
  Filled 2022-08-15: qty 30

## 2022-08-15 MED ORDER — PHENYLEPHRINE HCL (PRESSORS) 10 MG/ML IV SOLN
INTRAVENOUS | Status: DC | PRN
  Administered 2022-08-15 (×2): 80 ug via INTRAVENOUS

## 2022-08-15 MED ORDER — FENTANYL CITRATE (PF) 100 MCG/2ML IJ SOLN
INTRAMUSCULAR | Status: AC
  Filled 2022-08-15: qty 2

## 2022-08-15 MED ORDER — STERILE WATER FOR IRRIGATION IR SOLN
Status: DC | PRN
  Administered 2022-08-15: 1000 mL

## 2022-08-15 MED ORDER — OXYTOCIN-SODIUM CHLORIDE 30-0.9 UT/500ML-% IV SOLN
INTRAVENOUS | Status: DC | PRN
  Administered 2022-08-15: 300 mL via INTRAVENOUS

## 2022-08-15 MED ORDER — OXYTOCIN-SODIUM CHLORIDE 30-0.9 UT/500ML-% IV SOLN
2.5000 [IU]/h | INTRAVENOUS | Status: AC
  Administered 2022-08-15: 2.5 [IU]/h via INTRAVENOUS
  Filled 2022-08-15: qty 500

## 2022-08-15 MED ORDER — FLEET ENEMA 7-19 GM/118ML RE ENEM: 1.0000 | ENEMA | RECTAL | Status: DC | PRN

## 2022-08-15 MED ORDER — MORPHINE SULFATE (PF) 0.5 MG/ML IJ SOLN
INTRAMUSCULAR | Status: DC | PRN
  Administered 2022-08-15: 3 mg via EPIDURAL

## 2022-08-15 MED ORDER — ACETAMINOPHEN 500 MG PO TABS: 1000.0000 mg | ORAL_TABLET | ORAL | Status: DC

## 2022-08-15 MED ORDER — ZOLPIDEM TARTRATE 5 MG PO TABS: 5.0000 mg | ORAL_TABLET | Freq: Every evening | ORAL | Status: DC | PRN

## 2022-08-15 SURGICAL SUPPLY — 38 items
BENZOIN TINCTURE PRP APPL 2/3 (GAUZE/BANDAGES/DRESSINGS) IMPLANT
CHLORAPREP W/TINT 26 (MISCELLANEOUS) ×2 IMPLANT
CLAMP UMBILICAL CORD (MISCELLANEOUS) ×1 IMPLANT
CLOTH BEACON ORANGE TIMEOUT ST (SAFETY) ×1 IMPLANT
DRAPE C SECTION CLR SCREEN (DRAPES) ×1 IMPLANT
DRSG OPSITE POSTOP 4X10 (GAUZE/BANDAGES/DRESSINGS) ×1 IMPLANT
ELECT REM PT RETURN 9FT ADLT (ELECTROSURGICAL) ×1
ELECTRODE REM PT RTRN 9FT ADLT (ELECTROSURGICAL) ×1 IMPLANT
EXTRACTOR VACUUM KIWI (MISCELLANEOUS) IMPLANT
GAUZE SPONGE 4X4 12PLY STRL LF (GAUZE/BANDAGES/DRESSINGS) IMPLANT
GLOVE BIO SURGEON STRL SZ 6.5 (GLOVE) ×1 IMPLANT
GLOVE BIOGEL PI IND STRL 7.0 (GLOVE) ×2 IMPLANT
GOWN STRL REUS W/TWL LRG LVL3 (GOWN DISPOSABLE) ×2 IMPLANT
KIT ABG SYR 3ML LUER SLIP (SYRINGE) IMPLANT
MAT PREVALON FULL STRYKER (MISCELLANEOUS) IMPLANT
NDL HYPO 25X5/8 SAFETYGLIDE (NEEDLE) IMPLANT
NEEDLE HYPO 25X5/8 SAFETYGLIDE (NEEDLE) IMPLANT
NS IRRIG 1000ML POUR BTL (IV SOLUTION) ×1 IMPLANT
PACK C SECTION WH (CUSTOM PROCEDURE TRAY) ×1 IMPLANT
PAD ABD 7.5X8 STRL (GAUZE/BANDAGES/DRESSINGS) IMPLANT
PAD OB MATERNITY 4.3X12.25 (PERSONAL CARE ITEMS) ×1 IMPLANT
RETRACTOR WND ALEXIS 25 LRG (MISCELLANEOUS) ×1 IMPLANT
RTRCTR C-SECT PINK 25CM LRG (MISCELLANEOUS) IMPLANT
RTRCTR WOUND ALEXIS 25CM LRG (MISCELLANEOUS) ×1
STRIP CLOSURE SKIN 1/2X4 (GAUZE/BANDAGES/DRESSINGS) IMPLANT
SUT CHROMIC 1 CTX 36 (SUTURE) ×2 IMPLANT
SUT PLAIN 0 NONE (SUTURE) IMPLANT
SUT PLAIN 2 0 XLH (SUTURE) ×1 IMPLANT
SUT VIC AB 0 CT1 27 (SUTURE) ×2
SUT VIC AB 0 CT1 27XBRD ANBCTR (SUTURE) ×2 IMPLANT
SUT VIC AB 2-0 CT1 27 (SUTURE) ×1
SUT VIC AB 2-0 CT1 TAPERPNT 27 (SUTURE) ×1 IMPLANT
SUT VIC AB 3-0 CT1 27 (SUTURE)
SUT VIC AB 3-0 CT1 TAPERPNT 27 (SUTURE) IMPLANT
SUT VIC AB 4-0 KS 27 (SUTURE) ×1 IMPLANT
TOWEL OR 17X24 6PK STRL BLUE (TOWEL DISPOSABLE) ×1 IMPLANT
TRAY FOLEY W/BAG SLVR 14FR LF (SET/KITS/TRAYS/PACK) ×1 IMPLANT
WATER STERILE IRR 1000ML POUR (IV SOLUTION) ×1 IMPLANT

## 2022-08-15 NOTE — Inpatient Diabetes Management (Signed)
Inpatient Diabetes Program Recommendations  AACE/ADA: New Consensus Statement on Inpatient Glycemic Control (2015)  Target Ranges:  Prepandial:   less than 140 mg/dL      Peak postprandial:   less than 180 mg/dL (1-2 hours)      Critically ill patients:  140 - 180 mg/dL   Lab Results  Component Value Date   GLUCAP 96 08/15/2022   HGBA1C 5.6 02/14/2022    Review of Glycemic Control  Latest Reference Range & Units 08/15/22 00:53 08/15/22 01:58 08/15/22 05:34 08/15/22 09:50  Glucose-Capillary 70 - 99 mg/dL 122 (H) 449 (H) 83 96  (H): Data is abnormally high  Diabetes history: GDM Outpatient Diabetes medications: Metformin 500 mg BID Current orders for Inpatient glycemic control: None  Inpatient Diabetes Program Recommendations:    Referral for DM evaluation.  IOL for GDM.  Glucose this morning 83/96 mg/dL.  Please consider:  Novolog 0-14 units Q4H.  Will continue to follow while inpatient.  Thank you, Dulce Sellar, MSN, CDCES Diabetes Coordinator Inpatient Diabetes Program [phone] (team pager from 8a-5p)

## 2022-08-15 NOTE — Anesthesia Preprocedure Evaluation (Signed)
Anesthesia Evaluation  Patient identified by MRN, date of birth, ID band Patient awake    Reviewed: Allergy & Precautions, NPO status , Patient's Chart, lab work & pertinent test results  Airway Mallampati: III  TM Distance: >3 FB Neck ROM: Full    Dental no notable dental hx.    Pulmonary    Pulmonary exam normal breath sounds clear to auscultation       Cardiovascular hypertension (PIH),  Rhythm:Regular Rate:Normal     Neuro/Psych  Headaches PSYCHIATRIC DISORDERS Anxiety Depression       GI/Hepatic   Endo/Other  diabetes, Gestational    Renal/GU      Musculoskeletal   Abdominal   Peds  Hematology   Anesthesia Other Findings   Reproductive/Obstetrics                             Anesthesia Physical Anesthesia Plan  ASA: 3  Anesthesia Plan: Epidural   Post-op Pain Management:    Induction:   PONV Risk Score and Plan:   Airway Management Planned:   Additional Equipment:   Intra-op Plan:   Post-operative Plan:   Informed Consent: I have reviewed the patients History and Physical, chart, labs and discussed the procedure including the risks, benefits and alternatives for the proposed anesthesia with the patient or authorized representative who has indicated his/her understanding and acceptance.       Plan Discussed with:   Anesthesia Plan Comments: (I have discussed risks of neuraxial anesthesia including but not limited to infection, bleeding, nerve injury, back pain, headache, seizures, and failure of block. Patient denies bleeding disorders and is not currently anticoagulated. Labs have been reviewed. Risks and benefits discussed. All patient's questions answered.  )       Anesthesia Quick Evaluation

## 2022-08-15 NOTE — Op Note (Signed)
Operative Note    Preoperative Diagnosis IUP at 39wks  Cat 3 - non reassuring fetal heart tones GDMA2   Postoperative Diagnosis IUP at 39wks  Cat 3 - non reassuring fetal heart tones  GDMA2    Procedure: Primary low transverse cesarean section with double layered closure    Surgeon: Britt Bottom, DO  Assist: Autry-Lott, S DO   Anesthesia: Epidural   Fluids: LR  EBL: UOP: 49ml   Findings: Viable female infant in vertex position with loop of cord in front of babys' face. APGARS 9, 9; Weigth 8lbs 8oz.  Grossly normal uterus, tubes and ovaries   Specimen: Placenta to L/D   Procedure Note  An experienced assistant was required given the standard of surgical care given the complexity of the case and maternal body habitus.  This assistant was needed for exposure, dissection, suctioning, retraction, instrument exchange, assisting with delivery with administration of fundal pressure, and for overall help during the procedure.    Consent verified pre-op. All questions answered   Patient was taken to the operating room where her epidural anesthesia was  redosed and noted to be adequate for surgery. She was prepped and draped in the normal sterile fashion and placed in the dorsal supine position with a leftward tilt. An appropriate time out was performed.   A Pfannenstiel skin incision was then made 2 finger breaths above the pubic symphysis and  carried through to the underlying layer of fascia by sharp dissection and Bovie cautery. The fascia was nicked in the midline and the incision was extended laterally with Mayo scissors. The superior and inferior aspects of the incision were grasped with kocher clamps and dissected off the underlying rectus muscles.The rectus muscles were separated in the midline  and the peritoneal cavity entered bluntly. The peritoneal incision was then extended both superiorly and inferiorly with careful attention to avoid both bowel and bladder.    The Alexis self-retaining wound retractor was then placed and the lower uterine segment exposed.  The lower uterine segment was then incised in a transverse fashion and the cavity itself entered bluntly. The incision was extended bluntly. The infant's head was then lifted and delivered from the incision without difficulty. A coiled loop of umbilical cord was noted just in front of the baby's face.  The remainder of the infant delivered and the nose and mouth bulb suctioned. Vigorous spontaneous cry was noted during delivery. The cord was clamped and cut after a minute delay. The infant was handed off to the waiting NICU team.  Cord blood and gas were obtained.  The placenta was then spontaneously expressed from the uterus and the uterus cleared of all clots and debris with moist lap sponge. The uterine incision was then repaired in 2 layers the first layer was a running locked layer 1-0 chromic and the second an imbricating layer of the same suture. The tubes and ovaries were inspected and the gutters cleared of all clots and debris. The uterine incision was inspected and found to be hemostatic. All instruments and sponges were then removed from the abdomen.  The peritoneum and rectus muscles were then reapproximated in a running  fashion with sutures of 2-0 Vicryl. The fascia was then closed with 0 Vicryl in a running fashion. The skin was closed with a subcuticular stitch of 4-0 Vicryl on a Keith needle and then reinforced with benzoin and Steri-Strips.  At the conclusion of the procedure all instruments and sponge counts were correct. Patient was taken  to the recovery room in good condition with her baby accompanying her skin to skin.

## 2022-08-15 NOTE — Anesthesia Procedure Notes (Signed)
Epidural Patient location during procedure: OB Start time: 08/15/2022 12:35 PM End time: 08/15/2022 12:39 PM  Staffing Anesthesiologist: Linton Rump, MD Performed: anesthesiologist   Preanesthetic Checklist Completed: patient identified, IV checked, site marked, risks and benefits discussed, surgical consent, monitors and equipment checked, pre-op evaluation and timeout performed  Epidural Patient position: sitting Prep: DuraPrep and site prepped and draped Patient monitoring: continuous pulse ox and blood pressure Approach: midline Location: L3-L4 Injection technique: LOR saline  Needle:  Needle type: Tuohy  Needle gauge: 17 G Needle length: 9 cm and 9 Catheter type: closed end flexible Catheter size: 19 Gauge Catheter at skin depth: 11 cm Test dose: negative  Assessment Events: blood not aspirated, injection not painful, no injection resistance, no paresthesia and negative IV test  Additional Notes The patient has requested an epidural for labor pain management. Risks and benefits including, but not limited to, infection, bleeding, local anesthetic toxicity, headache, hypotension, back pain, block failure, etc. were discussed with the patient. The patient expressed understanding and consented to the procedure. I confirmed that the patient has no bleeding disorders and is not taking blood thinners. I confirmed the patient's last platelet count with the nurse. A time-out was performed immediately prior to the procedure. Please see nursing documentation for vital signs. Sterile technique was used throughout the whole procedure. Once LOR achieved, the epidural catheter threaded easily without resistance. Aspiration of the catheter was negative for blood and CSF. The epidural was dosed slowly and an infusion was started.  1 attempt(s)Reason for block:procedure for pain

## 2022-08-15 NOTE — Progress Notes (Signed)
Susan Branch is a 26 y.o. G1P0000 at [redacted]w[redacted]d by LMP who received 2 doses of cytotec vaginally overnight. She is now noted to be 2-3cm dilated and 701% effaced. S/P fentanyl x 2 - tolerating well. She has no complaints. CBG shows improved glucose levels   Subjective:   Objective: BP 134/77   Pulse 83   Temp 98.2 F (36.8 C) (Axillary)   Resp 17   Ht 5\' 4"  (1.626 m)   Wt 111.1 kg   LMP 11/02/2021 (Within Weeks)   BMI 42.05 kg/m  No intake/output data recorded. No intake/output data recorded.  FHT:  FHR: 140 bpm, variability: moderate,  accelerations:  Present,  decelerations:  Absent UC:   regular, every 2-4 minutes SVE:   Dilation: 3 Effacement (%): 70 Station: -2 Exam by:: Everlee Quakenbush  Labs: Lab Results  Component Value Date   WBC 11.1 (H) 08/15/2022   HGB 11.4 (L) 08/15/2022   HCT 32.5 (L) 08/15/2022   MCV 83.5 08/15/2022   PLT 235 08/15/2022    Assessment / Plan: Induction of labor due to gestational diabetes,  progressing well on pitocin  Labor:  s/p cytotec x 2, AROM performed with clear fluid noted; augment with pitocin if indicated later  Preeclampsia:   n/a Fetal Wellbeing:  Category I Pain Control:  IV pain meds I/D:  n/a Anticipated MOD:  NSVD  14/02/2022, DO 08/15/2022, 10:23 AM

## 2022-08-15 NOTE — Transfer of Care (Signed)
Immediate Anesthesia Transfer of Care Note  Patient: Susan Branch  Procedure(s) Performed: CESAREAN SECTION  Patient Location: PACU  Anesthesia Type:Epidural  Level of Consciousness: awake, alert , and oriented  Airway & Oxygen Therapy: Patient Spontanous Breathing  Post-op Assessment: Report given to RN and Post -op Vital signs reviewed and stable  Post vital signs: Reviewed and stable  Last Vitals:  Vitals Value Taken Time  BP 99/55 08/15/22 1731  Temp 36.8 C 08/15/22 1730  Pulse 108 08/15/22 1734  Resp 21 08/15/22 1734  SpO2 96 % 08/15/22 1734  Vitals shown include unvalidated device data.  Last Pain:  Vitals:   08/15/22 1520  TempSrc: Oral  PainSc:          Complications: No notable events documented.

## 2022-08-15 NOTE — Progress Notes (Signed)
Late entry  Susan Branch is a 26 y.o. G1P0000 at [redacted]w[redacted]d   I was called around 1:17pm and notified that pt had begun having late decelerations and was currently in a prolonged one).This was shortly after she received her epidural and was started on pitocin On arrival into the room a short while later ( I was on the 5th floor when called), pt was on her side, pitocin had been turned off and she was receiving a bolus of IV Fluid. The fetal monitoring strip still showed good variability but still had mild and nonrecurrent late decelerations though no longer into the 60s rather just into the 120s. BP was stable. Pt reports no complaints..  On exam, no significant cervical change noted   Objective: BP 116/66   Pulse (!) 112   Temp (!) 97.2 F (36.2 C) (Oral)   Resp 18   Ht 5\' 4"  (1.626 m)   Wt 111.1 kg   LMP 11/02/2021 (Within Weeks)   SpO2 99%   BMI 42.05 kg/m  No intake/output data recorded. No intake/output data recorded.  FHT:  FHR: 140 bpm, variability: moderate,  accelerations:  Present,  decelerations:  Present variables and occasional late decel; cat 2 UC:   irregular  SVE:   Dilation: 4 Effacement (%): 100 Station: -1 Exam by:: Susan Branch  Labs: Lab Results  Component Value Date   WBC 11.1 (H) 08/15/2022   HGB 11.4 (L) 08/15/2022   HCT 32.5 (L) 08/15/2022   MCV 83.5 08/15/2022   PLT 235 08/15/2022    Assessment / Plan: 26yo prime at 19  0/[redacted]wks gestation in latent labor Pt monitored for an hour and then pitocin restarted - low dose 1/1 Expectant monitoring   Susan Orea W Reshawn Ostlund, DO 08/15/2022, 2:57 PM

## 2022-08-15 NOTE — Progress Notes (Signed)
Pt had subtle intermittent fetal heart rate decelerations until about 20 mins ago when they begun to get deeper and more recurrent. FHR went into the 60-70s despite IUPC with bolus started and max pitocin of . Position changes were attempted and a second dose of terbulatine given with improvement noted but still present.  No cervical change noted- still 4cm dil I advised pt of concern for fetal well being given cat 3 strip remote from delivery.  I offered delivery via cesarean section and reviewed risks and benefits. Consent verified   Objective: BP 128/75   Pulse (!) 120   Temp 98.2 F (36.8 C) (Oral)   Resp 16   Ht 5\' 4"  (1.626 m)   Wt 111.1 kg   LMP 11/02/2021 (Within Weeks)   SpO2 99%   BMI 42.05 kg/m  No intake/output data recorded. No intake/output data recorded.  FHT:  FHR: 140 bpm, variability: minimal ,  accelerations:  Present,  decelerations:  Present recurrent late decelerations UC:   regular, every 1-2 minutes SVE:   Dilation: 4 Effacement (%): 100 Station: -2 Exam by:: Jolyn Deshmukh  Labs: Lab Results  Component Value Date   WBC 11.1 (H) 08/15/2022   HGB 11.4 (L) 08/15/2022   HCT 32.5 (L) 08/15/2022   MCV 83.5 08/15/2022   PLT 235 08/15/2022    Assessment / Plan: Induction of labor due to gestational diabetes,  progressing well on pitocin  Labor: sve 4cm dil Fetal Wellbeing:  Category III Pain Control:  Epidural I/D:  n/a Anticipated MOD:   deliver via cesarean section   Hance Caspers W Tamatha Gadbois, DO 08/15/2022, 4:14 PM

## 2022-08-16 ENCOUNTER — Encounter (HOSPITAL_COMMUNITY): Payer: Self-pay | Admitting: Obstetrics and Gynecology

## 2022-08-16 LAB — CBC
HCT: 26.7 % — ABNORMAL LOW (ref 36.0–46.0)
Hemoglobin: 9.7 g/dL — ABNORMAL LOW (ref 12.0–15.0)
MCH: 30.2 pg (ref 26.0–34.0)
MCHC: 36.3 g/dL — ABNORMAL HIGH (ref 30.0–36.0)
MCV: 83.2 fL (ref 80.0–100.0)
Platelets: 173 10*3/uL (ref 150–400)
RBC: 3.21 MIL/uL — ABNORMAL LOW (ref 3.87–5.11)
RDW: 13.5 % (ref 11.5–15.5)
WBC: 10.8 10*3/uL — ABNORMAL HIGH (ref 4.0–10.5)
nRBC: 0 % (ref 0.0–0.2)

## 2022-08-16 LAB — CREATININE, SERUM
Creatinine, Ser: 0.61 mg/dL (ref 0.44–1.00)
GFR, Estimated: >60 mL/min (ref >60)

## 2022-08-16 NOTE — Lactation Note (Signed)
This note was copied from a baby's chart. Lactation Consultation Note  Patient Name: Susan Branch [id number] Date: 08/16/2022   Age:24 hours  LC attempted to visit with the birth parent, but the RN was in the room. Lactation will follow up at a later time.  Maternal Data    Feeding    LATCH Score                    Lactation Tools Discussed/Used    Interventions    Discharge    Consult Status      [name] 08/16/2022, 10:11 PM

## 2022-08-16 NOTE — Lactation Note (Signed)
This note was copied from a baby's chart. Lactation Consultation Note  Patient Name: Susan Branch [id number] Date: 08/16/2022 Reason for consult: Mother's request;Primapara;1st time breastfeeding;Infant weight loss;Breastfeeding assistance;Term (1.81% WL) Age:24 hours  LC entered the room and the infant was being fed formula by the support parent.  Per the birth parent, things are going well with breastfeeding.  The birth parent had questions about pumping, milk production, and positioning.  All questions were answered.  The birth parent is aware to put the infant to the breast prior to supplementing.  The birth parent will call RN/LC for assistance with breastfeeding.   Feeding Mother's Current Feeding Choice: Breast Milk and Formula Nipple Type: Slow - flow  Interventions Interventions: Breast feeding basics reviewed;Education  Consult Status Consult Status: Follow-up Date: 08/17/22 Follow-up type: In-patient    [name] 08/16/2022, 10:45 PM

## 2022-08-16 NOTE — Anesthesia Postprocedure Evaluation (Signed)
Anesthesia Post Note  Patient: Susan Branch  Procedure(s) Performed: CESAREAN SECTION     Patient location during evaluation: PACU Anesthesia Type: Epidural Level of consciousness: awake Pain management: pain level controlled Vital Signs Assessment: post-procedure vital signs reviewed and stable Respiratory status: spontaneous breathing, nonlabored ventilation and respiratory function stable Cardiovascular status: stable Postop Assessment: no headache, no backache and epidural receding Anesthetic complications: no   No notable events documented.  Last Vitals:  Vitals:   08/16/22 0121 08/16/22 0518  BP: 119/76 118/78  Pulse: 78 84  Resp: 16 18  Temp: 36.6 C 36.9 C  SpO2: 96% 97%    Last Pain:  Vitals:   08/16/22 1151  TempSrc:   PainSc: Asleep                 Linton Rump

## 2022-08-16 NOTE — Lactation Note (Signed)
This note was copied from a baby's chart. Lactation Consultation Note  Patient Name: Susan Branch [id number] Date: 08/16/2022 Reason for consult: Initial assessment;Primapara;Maternal endocrine disorder;Term Age:24 hours Suggested mom call for latch for feeding today. Mom denies painful latches when BF. States the baby is BF well. Mom is alternating BF w/formula feeding. This feeding was formula feeding.  Newborn feeding habits, behavior, STS, I&O, positioning, support reviewed. Mom encouraged to feed baby 8-12 times/24 hours and with feeding cues. Reviewed supplementation and hand expression. Encouraged to call for assistance or questions. Maternal Data Has patient been taught Hand Expression?: Yes Does the patient have breastfeeding experience prior to this delivery?: No  Feeding Nipple Type: Slow - flow  LATCH Score       Type of Nipple: Everted at rest and after stimulation  Comfort (Breast/Nipple): Soft / non-tender         Lactation Tools Discussed/Used    Interventions Interventions: Breast feeding basics reviewed;LC Services brochure;Hand express;Breast compression;Pace feeding  Discharge    Consult Status Consult Status: Follow-up Date: 08/16/22 Follow-up type: In-patient    Charyl Dancer 08/16/2022, 2:44 AM

## 2022-08-16 NOTE — Lactation Note (Signed)
This note was copied from a baby's chart. Lactation Consultation Note  Patient Name: Susan Branch [id number] Date: 08/16/2022   Age:24 hours   LC Note:  Attempted to visit with mother, however, many visitors present.  Asked her to call me at her convenience for a consult.   Maternal Data    Feeding    LATCH Score                    Lactation Tools Discussed/Used    Interventions    Discharge    Consult Status      Dax Murguia R Genecis Veley 08/16/2022, 2:50 PM

## 2022-08-16 NOTE — Progress Notes (Signed)
Subjective: Postpartum Day 1: Cesarean Delivery Doing well this morning. Ambulated without issues. Has not yet voided, foley in place. Normal lochia. Pain controlled.   Objective: Patient Vitals for the past 24 hrs:  BP Temp Temp src Pulse Resp SpO2  08/16/22 0518 118/78 98.4 F (36.9 C) Oral 84 18 97 %  08/16/22 0121 119/76 97.8 F (36.6 C) Oral 78 16 96 %  08/15/22 2154 -- 98.3 F (36.8 C) Oral -- 20 --  08/15/22 2039 115/76 98.3 F (36.8 C) Oral 76 16 96 %  08/15/22 1941 112/76 98.2 F (36.8 C) Oral 87 16 99 %  08/15/22 1835 -- 98.6 F (37 C) -- -- -- --  08/15/22 1815 123/69 -- -- 99 14 97 %  08/15/22 1805 -- 97.9 F (36.6 C) Axillary (!) 106 (!) 22 98 %  08/15/22 1800 (!) 126/53 -- -- (!) 103 20 99 %  08/15/22 1745 (!) 99/50 -- -- (!) 107 (!) 22 98 %  08/15/22 1730 (!) 99/55 98.2 F (36.8 C) -- -- -- --  08/15/22 1600 128/75 -- -- (!) 120 -- --  08/15/22 1531 (!) 124/102 -- -- 82 -- --  08/15/22 1520 -- 98.2 F (36.8 C) Oral -- -- --  08/15/22 1501 118/70 -- -- (!) 104 16 --  08/15/22 1430 116/66 -- -- (!) 112 -- --  08/15/22 1401 (!) 111/52 -- -- (!) 111 -- --  08/15/22 1330 126/76 -- -- (!) 106 -- --  08/15/22 1322 110/64 -- -- 98 -- --  08/15/22 1316 (!) 120/53 -- -- 73 -- --  08/15/22 1311 129/72 -- -- 77 -- --  08/15/22 1306 -- (!) 97.2 F (36.2 C) Oral -- 18 --  08/15/22 1301 133/75 -- -- 77 -- --  08/15/22 1251 125/78 -- -- 86 -- --  08/15/22 1250 -- -- -- -- -- 99 %  08/15/22 1246 121/82 -- -- 85 -- --  08/15/22 1244 132/84 -- -- 69 -- --  08/15/22 1242 135/85 -- -- 69 -- --  08/15/22 1240 (!) 119/56 -- -- 73 -- 99 %  08/15/22 1237 -- -- -- -- -- 99 %  08/15/22 0952 134/77 -- -- 83 -- --     Physical Exam:  General: alert, cooperative, and no distress Lochia: appropriate Uterine Fundus: firm Incision: healing well, no significant drainage, no dehiscence, no significant erythema GU: foley draining concentrated yellow urine DVT Evaluation: No  evidence of DVT seen on physical exam.  Recent Labs    08/15/22 0809 08/16/22 0540  HGB 11.4* 9.7*  HCT 32.5* 26.7*    Assessment/Plan:  Susan Branch G1P1001 POD#1 sp primary CS at [redacted]w[redacted]d for NRFHT 1. PPC: routine PP care, increase PO hydration, d/c foley and follow up void 2. Desires neonatal circumcision, R/B/A of procedure discussed at length. Pt understands that neonatal circumcision is not considered medically necessary and is elective. The risks include, but are not limited to bleeding, infection, damage to the penis, development of scar tissue, and having to have it redone at a later date. Pt understands theses risks and wishes to proceed 3. Rh pos, rubella immune  Charlett Nose 08/16/2022, 9:33 AM

## 2022-08-16 NOTE — Progress Notes (Signed)
MOB was referred for history of depression/anxiety. * Referral screened out by Clinical Social Worker because none of the following criteria appear to apply: ~ History of anxiety/depression during this pregnancy, or of post-partum depression following prior delivery. ~ Diagnosis of anxiety and/or depression within last 3 years. No concerns were noted in MOB's OB records.  OR * MOB's symptoms currently being treated with medication and/or therapy. Please contact the Clinical Social Worker if needs arise, by MOB request, or if MOB scores greater than 9/yes to question 10 on Edinburgh Postpartum Depression Screen.   Telia Amundson Boyd-Gilyard, MSW, LCSW Clinical Social Work [phone]  

## 2022-08-17 NOTE — Progress Notes (Signed)
Subjective: Postpartum Day 2: Cesarean Delivery Patient reports pain controlled, no nausea or vomiting, tolerating po, ambulating without difficulty  Objective: Vital signs in last 24 hours: Temp:  [98.3 F (36.8 C)-99.1 F (37.3 C)] 98.3 F (36.8 C) (12/08 0601) Pulse Rate:  [88-101] 93 (12/08 0601) Resp:  [16-18] 18 (12/08 0601) BP: (120-132)/(80-99) 132/84 (12/08 0601) SpO2:  [100 %] 100 % (12/08 0601)  Physical Exam:  General: alert, cooperative, and appears stated age Lochia: appropriate Uterine Fundus: firm Incision: healing well DVT Evaluation: No evidence of DVT seen on physical exam.  Recent Labs    08/15/22 0809 08/16/22 0540  HGB 11.4* 9.7*  HCT 32.5* 26.7*    Assessment/Plan: Status post Cesarean section. Doing well postoperatively.  Continue current care.  Waynard Reeds, MD 08/17/2022, 1:34 PM

## 2022-08-17 NOTE — Progress Notes (Signed)
CSW received consult due to score 12 on Edinburgh Depression Screen and hx of anxiety.  When CSW arrived, MOB was resting in bed, MGM was holding infant, and FOB was asleep (FOB did not engage with CSW).  MGM appeared to be a support to MOB and engaged with CSW.  CSW explained CSW's role and MOB gave CSW permission to complete assessment while her guest were present. MOB was polite, forthcoming,and easy to engage.   CSW reviewed MOB's Edinburgh Score and MOB openly shared her MH hx.  Per MOB she discontinued her medications for anxiety and has been engaging in natural interventions.   CSW provided education regarding Baby Blues vs PMADs and provided MOB with resources for mental health follow up.  CSW encouraged MOB to evaluate her mental health throughout the postpartum period with the use of the New Mom Checklist developed by Postpartum [employer] as well as the [place] Postnatal Depression Scale and notify a medical professional if symptoms arise. MOB presented with insight and awareness and did not demonstrate any acute MH symptoms. MOB communicated feeling comfortable seeking help for MGM and FOB if needed. MOB denied SI, HI, and DV when assessed for safety.   MOB reports having all essential items to care for infant post discharge.   There are no barriers to discharge.   Blaine Hamper, MSW, LCSW Clinical Social Work [phone]

## 2022-08-17 NOTE — Lactation Note (Signed)
This note was copied from a baby's chart. Lactation Consultation Note  Patient Name: Keerthi Hazell Today's Date: 08/17/2022 Reason for consult: 1st time breastfeeding;Term;Follow-up assessment;Infant weight loss (C/S delivery and infant with -6% weight loss) Age:24 hours P1, term female infant. Per Birth Parent infant is latching well most feedings are 20 minutes in length, LC did not observe latch due infant BF at 1515 pm and 1 hour later he was supplemented with 17 mls of formula. Infant was asleep and appeared content in Family members arms. Birth Parent would like to use DEBP, LC fitted Birth Parent with 24 mm breast flange and Birth Parent was pumping when LC left the room and expressing few drops of colostrum that she will finger or spoon feed to infant. Birth Parent understands that EBM is safe at room temperature for 4 hours whereas formula must be used within 1 hour. Birth Parent doesn't have any bruises or abrasions on breast, but mention breast is sore, Birth Parent understands she can apply coconut oil or EBM and let air dry on breast, Birth Parent has been pulling infant off her breast and not breaking his latch which may have contributed to her breast soreness.  Birth Parent current feeding plan: 1- Continue to BF infant according to hunger cues, 8 to 12+ times within 24 hours, STS. 2- Birth Parent knows to offer both breast during a feeding if infant is still cuing after latching on the first breast. 3- Birth Parent will continue to use DEBP and give infant any EBM first before formula, plans to supplement infant every feeding after latching infant at the breast. Supplemental sheet given and at 48-72 hours of life she will offer infant ( 18-25 mls ) per feeding. 4- Birth Parent knows if she feels pinching and not a tug when latching infant to break latch and re-latch infant at the breast and not to pull infant off the breast without breaking the latch.  Maternal  Data    Feeding Mother's Current Feeding Choice: Breast Milk and Formula Nipple Type: Slow - flow  LATCH Score                    Lactation Tools Discussed/Used Tools: Pump;Flanges Flange Size: 24 Breast pump type: Double-Electric Breast Pump Pump Education: Setup, frequency, and cleaning;Milk Storage Reason for Pumping: Infant with -6% weight loss, C/S delivery, GDM and Birth Parent is supplementing with formula. Pumping frequency: Birth Parent will continue to use DEBP every 3 hours for 15 minutes on inital setting.  Interventions Interventions: DEBP;Education;Coconut oil;Hand express;Breast massage;Expressed milk;Skin to skin  Discharge    Consult Status Consult Status: Follow-up Date: 08/18/22 Follow-up type: In-patient    Frederico Hamman 08/17/2022, 4:59 PM

## 2022-08-18 MED ORDER — OXYCODONE HCL 5 MG PO TABS: 5.0000 mg | ORAL_TABLET | ORAL | 0 refills | Status: DC | PRN

## 2022-08-18 MED ORDER — IRON (FERROUS SULFATE) 325 (65 FE) MG PO TABS: 325.0000 mg | ORAL_TABLET | Freq: Every morning | ORAL | 3 refills | Status: AC

## 2022-08-18 NOTE — Discharge Summary (Signed)
Postpartum Discharge Summary  Date of Service updated      Patient Name: Susan Branch DOB: 23-Aug-1996 MRN: [id number]  Date of admission: 08/15/2022 Delivery date:08/15/2022  Delivering provider: Sherlyn Hay  Date of discharge: 08/18/2022  Admitting diagnosis: Encounter for induction of labor [Z34.90] Intrauterine pregnancy: [redacted]w[redacted]d    Secondary diagnosis:  Principal Problem:   Encounter for induction of labor  Additional problems: A2GDM    Discharge diagnosis: Induction of Labor With Cesarean Section   26y.o. yo G1P1001 at 373w0das admitted to the hospital 08/15/2022 for induction of labor. Patient had a labor course significant for nonreassuring FHTs. The patient went for cesarean section due to Non-Reassuring FHR. Delivery details are as follows: Membrane Rupture Time/Date: 10:04 AM ,08/15/2022   Delivery Method:C-Section, Low Transverse  Details of operation can be found in separate operative Note.  Patient had a postpartum course complicated bynone. She is ambulating, tolerating a regular diet, passing flatus, and urinating well.  Patient is discharged home in stable condition on 08/18/22.      Newborn Data: Birth date:08/15/2022  Birth time:4:46 PM  Gender:Female  Living status:Living  Apgars:9 ,9  Weight:3860 g                                                                            Post partum procedures: none Augmentation: AROM, Pitocin, and Cytotec Complications: None  Hospital course: Induction of Labor With Cesarean Section   26y.o. yo G1P1001 at 3941w0ds admitted to the hospital 08/15/2022 for induction of labor. Patient had a labor course significant for nonreassuring FHTs. The patient went for cesarean section due to Non-Reassuring FHR. Delivery details are as follows: Membrane Rupture Time/Date: 10:04 AM ,08/15/2022   Delivery Method:C-Section, Low Transverse  Details of operation can be found in separate operative Note.  Patient had a postpartum  course complicated bynone. She is ambulating, tolerating a regular diet, passing flatus, and urinating well.  Patient is discharged home in stable condition on 08/18/22.      Newborn Data: Birth date:08/15/2022  Birth time:4:46 PM  Gender:Female  Living status:Living  Apgars:9 ,9  Weight:3860 g                                Magnesium Sulfate received: No BMZ received: No Rhophylac:No MMR:No T-DaP:Given prenatally Flu: No Transfusion:No  Physical exam  Vitals:   08/17/22 0601 08/17/22 1440 08/17/22 2015 08/18/22 0500  BP: 132/84 127/85 124/83 136/89  Pulse: 93 99 100 96  Resp: _0 Temp: 98.3 F (36.8 C) 97.6 F (36.4 C) 98 F (36.7 C) 97.7 F (36.5 C)  TempSrc: Oral Oral Oral Oral  SpO2: 100% 100% 100% 99%  Weight:      Height:       Labs: Lab Results  Component Value Date   WBC 10.8 (H) 08/16/2022   HGB 9.7 (L) 08/16/2022   HCT 26.7 (L) 08/16/2022   MCV 83.2 08/16/2022   PLT 173 08/16/2022      Latest Ref Rng & Units 08/16/2022    5:40 AM  CMP  Creatinine 0.44 - 1.00  mg/dL 0.61    Edinburgh Score:    08/16/2022   10:08 PM  Edinburgh Postnatal Depression Scale Screening Tool  I have been able to laugh and see the funny side of things. 0  I have looked forward with enjoyment to things. 0  I have blamed myself unnecessarily when things went wrong. 3  I have been anxious or worried for no good reason. 2  I have felt scared or panicky for no good reason. 2  Things have been getting on top of me. 2  I have been so unhappy that I have had difficulty sleeping. 1  I have felt sad or miserable. 1  I have been so unhappy that I have been crying. 1  The thought of harming myself has occurred to me. 0  Edinburgh Postnatal Depression Scale Total 12      After visit meds:  Allergies as of 08/18/2022       Reactions   Adhesive [tape] Rash   Latex Rash   Soap Rash        Medication List     STOP taking these medications    aspirin 81 MG chewable  tablet   metFORMIN 500 MG tablet Commonly known as: GLUCOPHAGE       TAKE these medications    cetirizine 10 MG tablet Commonly known as: ZyrTEC Allergy Take 1 tablet (10 mg total) by mouth daily.   fluticasone 50 MCG/ACT nasal spray Commonly known as: FLONASE Place 1 spray into both nostrils 2 (two) times daily.   Iron (Ferrous Sulfate) 325 (65 Fe) MG Tabs Take 325 mg by mouth every morning.   oxyCODONE 5 MG immediate release tablet Commonly known as: Oxy IR/ROXICODONE Take 1-2 tablets (5-10 mg total) by mouth every 4 (four) hours as needed for moderate pain. What changed:  how much to take when to take this reasons to take this   PRENATAL PO Take 1 tablet by mouth daily.         Discharge home in stable condition Infant Feeding:  ? Infant Disposition:home with mother Discharge instruction: per After Visit Summary and Postpartum booklet. Activity: Advance as tolerated. Pelvic rest for 6 weeks.  Diet: routine diet Anticipated Birth Control: Unsure Postpartum Appointment:6 weeks Additional Postpartum F/U: Incision check 2 weeks Future Appointments: Future Appointments  Date Time Provider [place]  10/02/2022 10:40 AM Alvira Monday, [facility]   Follow up Visit:  [place], DO Follow up in 2 week(s).   Specialty: Obstetrics and Gynecology Contact information: [address] [place] [address] [phone]                     08/18/2022 Daria Pastures, MD

## 2022-08-18 NOTE — Progress Notes (Signed)
  Patient is eating, ambulating, voiding.  Pain control is good.  Vitals:   08/17/22 0601 08/17/22 1440 08/17/22 2015 08/18/22 0500  BP: 132/84 127/85 124/83 136/89  Pulse: 93 99 100 96  Resp: 18 18 18 18   Temp: 98.3 F (36.8 C) 97.6 F (36.4 C) 98 F (36.7 C) 97.7 F (36.5 C)  TempSrc: Oral Oral Oral Oral  SpO2: 100% 100% 100% 99%  Weight:      Height:        lungs:   clear to auscultation cor:    RRR Abdomen:  soft, appropriate tenderness, incisions intact and without erythema or exudate ex:    no cords   Lab Results  Component Value Date   WBC 10.8 (H) 08/16/2022   HGB 9.7 (L) 08/16/2022   HCT 26.7 (L) 08/16/2022   MCV 83.2 08/16/2022   PLT 173 08/16/2022    --/--/B POS (12/06 0040)/RI  A/P    Post operative day 3.  Routine post op and postpartum care.  Expect d/c today.  Oxycodone for pain control. Iron.

## 2022-08-20 ENCOUNTER — Telehealth (HOSPITAL_COMMUNITY): Payer: Self-pay | Admitting: *Deleted

## 2022-08-20 NOTE — Telephone Encounter (Signed)
Note opened in error. Deforest Hoyles, RN, 08/20/22, [phone]

## 2022-08-22 ENCOUNTER — Telehealth (HOSPITAL_COMMUNITY): Payer: Self-pay | Admitting: *Deleted

## 2022-08-22 ENCOUNTER — Telehealth: Payer: Self-pay | Admitting: *Deleted

## 2022-08-22 NOTE — Telephone Encounter (Signed)
Patient delivered via C-section. Patient c/o pain with urination. Denied urgency, burning, and/or inablility to empty her bladder. RN encouraged patient to contact her OB tomorrow to discuss her concerns. Patient verbalized understanding. Patient voiced no other questions or concerns regarding her health at this time. EPDS=11. RN explained that she would notify provider of the score. EPDS score faxed to Dr. Mindi Slicker. Patient agreed to have RN email her a list of maternal mental health resources as well. Patient voiced no questions or concerns regarding infant at this time. Reported infant was seen by pediatrician today and has follow-up appointment scheduled for 12/15 for weight check. Patient reports infant sleeps in a crib on his back. RN reviewed ABCs of safe sleep. Patient verbalized understanding. Patient requested RN email information on hospital's virtual postpartum classes and support groups. Email sent. Deforest Hoyles, RN, 08/22/22, [phone]

## 2022-08-22 NOTE — Patient Outreach (Signed)
  Care Coordination [facility] Note Transition Care Management Unsuccessful Follow-up Telephone Call  Date of discharge and from where:  08/18/22 from [facility]  Attempts:  1st Attempt  Reason for unsuccessful TCM follow-up call:  Left voice message   Estanislado Emms RN, BSN [place]  [facility] Care Coordinator

## 2022-08-30 ENCOUNTER — Encounter (HOSPITAL_COMMUNITY): Payer: Self-pay | Admitting: Obstetrics and Gynecology

## 2022-08-30 ENCOUNTER — Inpatient Hospital Stay (HOSPITAL_COMMUNITY)
Admission: AD | Admit: 2022-08-30 | Discharge: 2022-08-30 | Disposition: A | Discharge status: Home / Self Care | Payer: 59 | Attending: Obstetrics | Admitting: Obstetrics

## 2022-08-30 DIAGNOSIS — R609 Edema, unspecified: Secondary | ICD-10-CM | POA: Diagnosis not present

## 2022-08-30 DIAGNOSIS — O24439 Gestational diabetes mellitus in the puerperium, unspecified control: Secondary | ICD-10-CM | POA: Insufficient documentation

## 2022-08-30 DIAGNOSIS — O165 Unspecified maternal hypertension, complicating the puerperium: Secondary | ICD-10-CM | POA: Diagnosis present

## 2022-08-30 DIAGNOSIS — R519 Headache, unspecified: Secondary | ICD-10-CM | POA: Diagnosis not present

## 2022-08-30 DIAGNOSIS — Z98891 History of uterine scar from previous surgery: Secondary | ICD-10-CM

## 2022-08-30 LAB — COMPREHENSIVE METABOLIC PANEL
ALT: 12 U/L (ref 0–44)
AST: 15 U/L (ref 15–41)
Albumin: 3.3 g/dL — ABNORMAL LOW (ref 3.5–5.0)
Alkaline Phosphatase: 90 U/L (ref 38–126)
Anion gap: 7 (ref 5–15)
BUN: 8 mg/dL (ref 6–20)
CO2: 24 mmol/L (ref 22–32)
Calcium: 8.9 mg/dL (ref 8.9–10.3)
Chloride: 108 mmol/L (ref 98–111)
Creatinine, Ser: 0.85 mg/dL (ref 0.44–1.00)
GFR, Estimated: >60 mL/min (ref >60)
Glucose, Bld: 92 mg/dL (ref 70–99)
Potassium: 3.6 mmol/L (ref 3.5–5.1)
Sodium: 139 mmol/L (ref 135–145)
Total Bilirubin: 0.2 mg/dL — ABNORMAL LOW (ref 0.3–1.2)
Total Protein: 6.7 g/dL (ref 6.5–8.1)

## 2022-08-30 LAB — CBC
HCT: 30.6 % — ABNORMAL LOW (ref 36.0–46.0)
Hemoglobin: 10.5 g/dL — ABNORMAL LOW (ref 12.0–15.0)
MCH: 28.7 pg (ref 26.0–34.0)
MCHC: 34.3 g/dL (ref 30.0–36.0)
MCV: 83.6 fL (ref 80.0–100.0)
Platelets: 433 10*3/uL — ABNORMAL HIGH (ref 150–400)
RBC: 3.66 MIL/uL — ABNORMAL LOW (ref 3.87–5.11)
RDW: 12.6 % (ref 11.5–15.5)
WBC: 8.8 10*3/uL (ref 4.0–10.5)
nRBC: 0 % (ref 0.0–0.2)

## 2022-08-30 LAB — SAMPLE TO BLOOD BANK

## 2022-08-30 MED ORDER — FUROSEMIDE 20 MG PO TABS: 20.0000 mg | ORAL_TABLET | Freq: Every day | ORAL | 0 refills | Status: DC

## 2022-08-30 MED ORDER — NIFEDIPINE ER OSMOTIC RELEASE 30 MG PO TB24
30.0000 mg | ORAL_TABLET | Freq: Once | ORAL | Status: AC
  Administered 2022-08-30: 30 mg via ORAL
  Filled 2022-08-30: qty 1

## 2022-08-30 MED ORDER — ACETAMINOPHEN-CAFFEINE 500-65 MG PO TABS
2.0000 | ORAL_TABLET | Freq: Once | ORAL | Status: AC
  Administered 2022-08-30: 2 via ORAL
  Filled 2022-08-30: qty 2

## 2022-08-30 NOTE — MAU Note (Signed)
Pt states her initial h/a is better and is now a 2. But states now she has periodic sharp pains over R eye that come and go quickly.

## 2022-08-30 NOTE — MAU Note (Signed)
.  Susan Branch is a 26 y.o. here in MAU reporting HTN. Pt had C/S on 12/6 due to fetal heart rate intolerance. Was seen at office today for incision check and her b/p was elevated. B/P med called in for pt which she picked up but has not taken yet. Has slight headache Onset of complaint: today Pain score: 2 Vitals:   08/30/22 2055 08/30/22 2058  BP:  (!) 163/93  Pulse: 69   Resp: 18   Temp: 98.8 F (37.1 C)   SpO2: 100%      FHT:n/a Lab orders placed from triage:  none

## 2022-08-30 NOTE — MAU Provider Note (Signed)
Chief Complaint:  Hypertension   Event Date/Time   First Provider Initiated Contact with Patient 08/30/22 2104     HPI: Susan Branch is a 26 y.o. G1P1001 who presents to maternity admissions reporting elevated blood pressure today.  States had a few elevated BPs in hospital and another in office today. States the doctor she saw told her it was fine, but Dr Terri Piedra called her later and told her to come in.  Went to store and used the machine and got a very high reading.   Has mild headache today but did not take anything.   History of migraines.. She reports vaginal bleeding, Denies urinary symptoms, dizziness, n/v, or fever/chills.    Hypertension This is a recurrent problem. The current episode started in the past 7 days. Associated symptoms include headaches and peripheral edema (new this week). Pertinent negatives include no anxiety, blurred vision, chest pain, malaise/fatigue, palpitations or shortness of breath. There are no associated agents to hypertension. There are no known risk factors for coronary artery disease. Past treatments include nothing (was given rx for Procardia XL today but did not pick it up).   RN Note: Susan Branch is a 26 y.o. here in MAU reporting HTN. Pt had C/S on 12/6 due to fetal heart rate intolerance. Was seen at office today for incision check and her b/p was elevated. B/P med called in for pt which she picked up but has not taken yet. Has slight headache Onset of complaint: today      Pain score: 2  Past Medical History: Past Medical History:  Diagnosis Date   Allergy    Anxiety    Borderline hypertension    no current med.   Diabetes mellitus without complication ([place])    Gestational diabetes    Headache    Tonsillar and adenoid hypertrophy 05/2013   snores during sleep, mother denies apnea    Past obstetric history: OB History  Gravida Para Term Preterm AB Living  1 1 1  0 0 1  SAB IAB Ectopic Multiple Live Births  0 0 0 0 1    # Outcome Date  GA Lbr Len/2nd Weight Sex Delivery Anes PTL Lv  1 Term 08/15/22 [redacted]w[redacted]d  3860 g M CS-LTranv EPI  LIV    Past Surgical History: Past Surgical History:  Procedure Laterality Date   CESAREAN SECTION N/A 08/15/2022   Procedure: CESAREAN SECTION;  Surgeon: Sherlyn Hay, DO;  Location: [facility];  Service: Obstetrics;  Laterality: N/A;   TONSILLECTOMY AND ADENOIDECTOMY N/A 06/02/2013   Procedure: TONSILLECTOMY AND ADENOIDECTOMY;  Surgeon: Ascencion Dike, MD;  Location: [facility];  Service: ENT;  Laterality: N/A;   TURBINATE REDUCTION Bilateral 06/02/2013   Procedure: TURBINATE REDUCTION BILATERAL;  Surgeon: Ascencion Dike, MD;  Location: [facility];  Service: ENT;  Laterality: Bilateral;    Family History: Family History  Problem Relation Age of Onset   Diabetes Mother    Depression Father    Diabetes Father    Hypertension Father    Heart disease Father        MI   Hyperlipidemia Father    Kidney disease Father    Vision loss Father    Bipolar disorder Father    Vision loss Maternal Grandmother    Stroke Maternal Grandmother    Diabetes Paternal Grandmother    Kidney failure Paternal Grandmother    Diabetes Paternal Grandfather    Kidney failure Paternal Grandfather  Muscular dystrophy Paternal Grandfather     Social History: Social History   Tobacco Use   Smoking status: Never    Passive exposure: Yes   Smokeless tobacco: Never  Vaping Use   Vaping Use: Never used  Substance Use Topics   Alcohol use: No   Drug use: Not Currently    Frequency: 4.0 times per week    Types: Marijuana    Allergies:  Allergies  Allergen Reactions   Adhesive [Tape] Rash   Latex Rash   Soap Rash    Meds:  Medications Prior to Admission  Medication Sig Dispense Refill Last Dose   cetirizine (ZYRTEC ALLERGY) 10 MG tablet Take 1 tablet (10 mg total) by mouth daily. (Patient not taking: Reported on 08/16/2022) 30 tablet 2    fluticasone (FLONASE) 50  MCG/ACT nasal spray Place 1 spray into both nostrils 2 (two) times daily. (Patient not taking: Reported on 08/16/2022) 16 g 2    Iron, Ferrous Sulfate, 325 (65 Fe) MG TABS Take 325 mg by mouth every morning. 90 tablet 3    oxyCODONE (OXY IR/ROXICODONE) 5 MG immediate release tablet Take 1-2 tablets (5-10 mg total) by mouth every 4 (four) hours as needed for moderate pain. 30 tablet 0    Prenatal Vit-Fe Fumarate-FA (PRENATAL PO) Take 1 tablet by mouth daily.       I have reviewed patient's Past Medical Hx, Surgical Hx, Family Hx, Social Hx, medications and allergies.  ROS:  Review of Systems  Constitutional:  Negative for malaise/fatigue.  Eyes:  Negative for blurred vision.  Respiratory:  Negative for shortness of breath.   Cardiovascular:  Negative for chest pain and palpitations.  Neurological:  Positive for headaches.   Other systems negative     Physical Exam  Patient Vitals for the past 24 hrs:  BP Temp Pulse Resp SpO2 Height Weight  08/30/22 2058 (!) 163/93 -- -- -- -- -- --  08/30/22 2055 -- 98.8 F (37.1 C) 69 18 100 % 5\' 4"  (1.626 m) 108 kg   Vitals:   08/30/22 2131 08/30/22 2201 08/30/22 2216 08/30/22 2231  BP: (!) 146/93 (!) 157/98 (!) 145/81 (!) 156/98  Pulse: 78 72 80 82  Resp:      Temp:      SpO2:      Weight:      Height:        Constitutional: Well-developed, well-nourished female in no acute distress.  Cardiovascular: normal rate and rhythm, no ectopy audible, S1 & S2 heard, no murmur Respiratory: normal effort, no distress. Lungs CTAB with no wheezes or crackles GI: Abd soft, non-tender.  Nondistended.  No rebound, No guarding.    MS: Extremities nontender, no edema, normal ROM Neurologic: Alert and oriented x 4.   Grossly nonfocal. GU: Neg CVAT. Skin:  Warm and Dry Psych:  Affect appropriate.  PELVIC EXAM: deferred   Labs: Results for orders placed or performed during the hospital encounter of 08/30/22 (from the past 24 hour(s))  Sample to Blood  Bank     Status: None   Collection Time: 08/30/22  9:16 PM  Result Value Ref Range   Blood Bank Specimen SAMPLE AVAILABLE FOR TESTING    Sample Expiration      08/31/2022,2359 Performed at [facility] [address], [place], [place] [address]   CBC     Status: Abnormal   Collection Time: 08/30/22  9:18 PM  Result Value Ref Range   WBC 8.8 4.0 - 10.5  K/uL   RBC 3.66 (L) 3.87 - 5.11 MIL/uL   Hemoglobin 10.5 (L) 12.0 - 15.0 g/dL   HCT 81.8 (L) 56.3 - 14.9 %   MCV 83.6 80.0 - 100.0 fL   MCH 28.7 26.0 - 34.0 pg   MCHC 34.3 30.0 - 36.0 g/dL   RDW 70.2 63.7 - 85.8 %   Platelets 433 (H) 150 - 400 K/uL   nRBC 0.0 0.0 - 0.2 %  Comprehensive metabolic panel     Status: Abnormal   Collection Time: 08/30/22  9:18 PM  Result Value Ref Range   Sodium 139 135 - 145 mmol/L   Potassium 3.6 3.5 - 5.1 mmol/L   Chloride 108 98 - 111 mmol/L   CO2 24 22 - 32 mmol/L   Glucose, Bld 92 70 - 99 mg/dL   BUN 8 6 - 20 mg/dL   Creatinine, Ser 8.50 0.44 - 1.00 mg/dL   Calcium 8.9 8.9 - 27.7 mg/dL   Total Protein 6.7 6.5 - 8.1 g/dL   Albumin 3.3 (L) 3.5 - 5.0 g/dL   AST 15 15 - 41 U/L   ALT 12 0 - 44 U/L   Alkaline Phosphatase 90 38 - 126 U/L   Total Bilirubin 0.2 (L) 0.3 - 1.2 mg/dL   GFR, Estimated >41 >28 mL/min   Anion gap 7 5 - 15    --/--/B POS (12/06 0040)  Imaging:  No results found.  MAU Course/MDM: I have reviewed the triage vital signs and the nursing notes.   Pertinent labs & imaging results that were available during my care of the patient were reviewed by me and considered in my medical decision making (see chart for details).      I have reviewed her medical records including past results, notes and treatments.   I have ordered labs as follows: CBC, CMET  These are normal  Imaging ordered: none Results reviewed.   Consult Dr Debroah Loop and Dr Chestine Spore.  Pt has Rx for Procardia XL and We will add 5 days of Lasix. Has appt tomorrow. .   Treatments in MAU included excedrin  Tension which helped headache Pt stable at time of discharge.  Assessment: PostOperative Day #15 Postpartum Hypertension, question preexisting chronic hypertension (states she thinks she's been told that) Edema Headache, resolved  Plan: Discharge home Recommend keep appt for tomorrow Rx sent for Lasix for diuresis  Encouraged to return here or to other Urgent Care/ED if she develops worsening of symptoms, increase in pain, fever, or other concerning symptoms.   Wynelle Bourgeois CNM, MSN Certified Nurse-Midwife 08/30/2022 9:04 PM

## 2022-09-01 DIAGNOSIS — O139 Gestational [pregnancy-induced] hypertension without significant proteinuria, unspecified trimester: Secondary | ICD-10-CM | POA: Diagnosis not present

## 2022-09-14 ENCOUNTER — Encounter: Payer: Self-pay | Admitting: Emergency Medicine

## 2022-09-14 ENCOUNTER — Other Ambulatory Visit: Payer: Self-pay

## 2022-09-14 ENCOUNTER — Ambulatory Visit
Admission: EM | Admit: 2022-09-14 | Discharge: 2022-09-14 | Disposition: A | Discharge status: Home / Self Care | Payer: 59 | Attending: Nurse Practitioner | Admitting: Nurse Practitioner

## 2022-09-14 DIAGNOSIS — Z20828 Contact with and (suspected) exposure to other viral communicable diseases: Secondary | ICD-10-CM | POA: Diagnosis not present

## 2022-09-14 DIAGNOSIS — Z1152 Encounter for screening for COVID-19: Secondary | ICD-10-CM

## 2022-09-14 DIAGNOSIS — J069 Acute upper respiratory infection, unspecified: Secondary | ICD-10-CM

## 2022-09-14 LAB — POCT RAPID STREP A (OFFICE): Rapid Strep A Screen: NEGATIVE

## 2022-09-14 MED ORDER — OSELTAMIVIR PHOSPHATE 75 MG PO CAPS: 75.0000 mg | ORAL_CAPSULE | Freq: Two times a day (BID) | ORAL | 0 refills | Status: DC

## 2022-09-14 NOTE — ED Provider Notes (Signed)
[facility]    CSN: [id number] Arrival date & time: 09/14/22  1426      History   Chief Complaint Chief Complaint  Patient presents with   Cough    HPI Susan Branch is a 27 y.o. female.   The history is provided by the patient.   The patient presents with a 2-day history of sore throat and cough.  Patient denies fever, chills, ear pain, wheezing, shortness of breath, difficulty breathing, or GI symptoms.  Patient states that her sister who lives in the home with her with a close exposure with influenza.  Patient also reports that she has a newborn at home.  Patient reports that she is currently breast-feeding.  Past Medical History:  Diagnosis Date   Allergy    Anxiety    Borderline hypertension    no current med.   Diabetes mellitus without complication ([place])    Gestational diabetes    Headache    Tonsillar and adenoid hypertrophy 05/2013   snores during sleep, mother denies apnea    Patient Active Problem List   Diagnosis Date Noted   Encounter for induction of labor 08/15/2022   Carrier of spinal muscular atrophy 02/26/2022   Elevated blood pressure reading with diagnosis of hypertension 02/16/2022   GBS bacteriuria 02/16/2022   Supervision of normal first pregnancy 02/13/2022   GAD (generalized anxiety disorder) 10/01/2018   Depression, major, single episode, moderate ([place]) 09/25/2017   Obesity 04/22/2014   Acne vulgaris 04/22/2014   Migraine headache 04/22/2014    Past Surgical History:  Procedure Laterality Date   CESAREAN SECTION N/A 08/15/2022   Procedure: CESAREAN SECTION;  Surgeon: Sherlyn Hay, DO;  Location: [facility];  Service: Obstetrics;  Laterality: N/A;   TONSILLECTOMY AND ADENOIDECTOMY N/A 06/02/2013   Procedure: TONSILLECTOMY AND ADENOIDECTOMY;  Surgeon: Ascencion Dike, MD;  Location: [facility];  Service: ENT;  Laterality: N/A;   TURBINATE REDUCTION Bilateral 06/02/2013   Procedure: TURBINATE REDUCTION  BILATERAL;  Surgeon: Ascencion Dike, MD;  Location: [facility];  Service: ENT;  Laterality: Bilateral;    OB History     Gravida  1   Para  1   Term  1   Preterm  0   AB  0   Living  1      SAB  0   IAB  0   Ectopic  0   Multiple  0   Live Births  1            Home Medications    Prior to Admission medications   Medication Sig Start Date End Date Taking? Authorizing Provider  oseltamivir (TAMIFLU) 75 MG capsule Take 1 capsule (75 mg total) by mouth every 12 (twelve) hours. 09/14/22  Yes Magalie Almon-Warren, Alda Lea, NP  cetirizine (ZYRTEC ALLERGY) 10 MG tablet Take 1 tablet (10 mg total) by mouth daily. Patient not taking: Reported on 08/16/2022 07/05/22   Volney American, PA-C  fluticasone [facility]) 50 MCG/ACT nasal spray Place 1 spray into both nostrils 2 (two) times daily. Patient not taking: Reported on 08/16/2022 07/05/22   Volney American, PA-C  furosemide (LASIX) 20 MG tablet Take 1 tablet (20 mg total) by mouth daily for 5 days. Patient not taking: Reported on 09/14/2022 08/30/22 09/04/22  Seabron Spates, CNM  Iron, Ferrous Sulfate, 325 (65 Fe) MG TABS Take 325 mg by mouth every morning. Patient not taking: Reported on 09/14/2022 08/18/22   Philis Pique,  Marcelino Duster, MD  oxyCODONE (OXY IR/ROXICODONE) 5 MG immediate release tablet Take 1-2 tablets (5-10 mg total) by mouth every 4 (four) hours as needed for moderate pain. Patient not taking: Reported on 08/30/2022 08/18/22   Carrington Clamp, MD  Prenatal Vit-Fe Fumarate-FA (PRENATAL PO) Take 1 tablet by mouth daily. Patient not taking: Reported on 09/14/2022    [provider]  norgestimate-ethinyl estradiol (SPRINTEC 28) 0.25-35 MG-MCG tablet Take 1 tablet by mouth daily. 11/04/19 12/22/19  Salley Scarlet, MD    Family History Family History  Problem Relation Age of Onset   Diabetes Mother    Depression Father    Diabetes Father    Hypertension Father    Heart disease Father         MI   Hyperlipidemia Father    Kidney disease Father    Vision loss Father    Bipolar disorder Father    Vision loss Maternal Grandmother    Stroke Maternal Grandmother    Diabetes Paternal Grandmother    Kidney failure Paternal Grandmother    Diabetes Paternal Grandfather    Kidney failure Paternal Grandfather    Muscular dystrophy Paternal Grandfather     Social History Social History   Tobacco Use   Smoking status: Never    Passive exposure: Yes   Smokeless tobacco: Never  Vaping Use   Vaping Use: Never used  Substance Use Topics   Alcohol use: No   Drug use: Not Currently    Frequency: 4.0 times per week    Types: Marijuana     Allergies   Adhesive [tape], Latex, and Soap   Review of Systems Review of Systems Per HPI  Physical Exam Triage Vital Signs ED Triage Vitals  Enc Vitals Group     BP 09/14/22 1448 135/88     Pulse Rate 09/14/22 1448 90     Resp 09/14/22 1448 18     Temp 09/14/22 1448 99.1 F (37.3 C)     Temp Source 09/14/22 1448 Oral     SpO2 09/14/22 1448 97 %     Weight --      Height --      Head Circumference --      Peak Flow --      Pain Score 09/14/22 1454 4     Pain Loc --      Pain Edu? --      Excl. in GC? --    No data found.  Updated Vital Signs BP 135/88 (BP Location: Right Arm)   Pulse 90   Temp 99.1 F (37.3 C) (Oral)   Resp 18   SpO2 97%   Breastfeeding Yes   Visual Acuity Right Eye Distance:   Left Eye Distance:   Bilateral Distance:    Right Eye Near:   Left Eye Near:    Bilateral Near:     Physical Exam Vitals and nursing note reviewed.  Constitutional:      General: She is not in acute distress.    Appearance: She is well-developed.  HENT:     Head: Normocephalic.     Right Ear: Tympanic membrane, ear canal and external ear normal.     Left Ear: Tympanic membrane, ear canal and external ear normal.     Nose: Nose normal.     Mouth/Throat:     Mouth: Mucous membranes are moist.     Pharynx:  Posterior oropharyngeal erythema present. No oropharyngeal exudate.     Comments: Cobblestoning present on oropharynx  Eyes:     Extraocular Movements: Extraocular movements intact.     Conjunctiva/sclera: Conjunctivae normal.     Pupils: Pupils are equal, round, and reactive to light.  Cardiovascular:     Rate and Rhythm: Normal rate and regular rhythm.     Pulses: Normal pulses.     Heart sounds: Normal heart sounds.  Pulmonary:     Effort: Pulmonary effort is normal. No respiratory distress.     Breath sounds: Normal breath sounds. No stridor. No wheezing, rhonchi or rales.  Abdominal:     General: Bowel sounds are normal. There is no distension.     Palpations: Abdomen is soft.     Tenderness: There is no abdominal tenderness. There is no guarding or rebound.  Genitourinary:    Vagina: Normal. No vaginal discharge.  Musculoskeletal:     Cervical back: Normal range of motion.  Skin:    General: Skin is warm and dry.     Findings: No erythema or rash.  Neurological:     General: No focal deficit present.     Mental Status: She is alert and oriented to person, place, and time.     Cranial Nerves: No cranial nerve deficit.  Psychiatric:        Mood and Affect: Mood normal.        Behavior: Behavior normal.      UC Treatments / Results  Labs (all labs ordered are listed, but only abnormal results are displayed) Labs Reviewed  SARS CORONAVIRUS 2 (TAT 6-24 HRS)  POCT RAPID STREP A (OFFICE)    EKG   Radiology No results found.  Procedures Procedures (including critical care time)  Medications Ordered in UC Medications - No data to display  Initial Impression / Assessment and Plan / UC Course  I have reviewed the triage vital signs and the nursing notes.  Pertinent labs & imaging results that were available during my care of the patient were reviewed by me and considered in my medical decision making (see chart for details).  The patient is well-appearing, she is  in no acute distress, vital signs are stable.  Suspect a viral upper respiratory infection with cough at this time.  COVID test is pending.  Patient is a candidate to receive Paxlovid if her COVID test is positive.  Due to the patient's recent exposure to influenza and being symptomatic, will start Tamiflu 75 mg.  Patient is currently breast-feeding; however, medication is safe with breast-feeding.  Supportive care recommendations were provided to the patient to include increase fluids, allow for plenty of rest, and use of Tylenol or ibuprofen as needed for pain or discomfort.  Discussed viral etiology with the patient and when follow-up may be necessary.  Patient verbalizes understanding.  All questions were answered.  Patient is stable for discharge.  Final Clinical Impressions(s) / UC Diagnoses   Final diagnoses:  Encounter for screening for COVID-19  Exposure to influenza  Viral upper respiratory tract infection with cough     Discharge Instructions      We have tested you today for COVID-19 and strep throat.  The rapid strep test is negative.  For your COVID test, you will see the results in Mychart and we will call you with positive results.    Please stay home and isolate until you are aware of the results.     Some things that can make you feel better are: - Increased rest - Increasing fluid with water/sugar free electrolytes - Acetaminophen and  ibuprofen as needed for fever/pain - Salt water gargling, chloraseptic spray and throat lozenges - OTC guaifenesin (Mucinex) 600 mg twice daily - Saline sinus flushes or a neti pot - Humidifying the air  It is safe for you to continue breast-feeding while taking the Tamiflu.  A viral illness may from 7 to 14 days.  If your symptoms worsen before that time, or extend beyond that timeframe, please follow-up with your primary care physician for further evaluation.     ED Prescriptions     Medication Sig Dispense Auth. Provider    oseltamivir (TAMIFLU) 75 MG capsule Take 1 capsule (75 mg total) by mouth every 12 (twelve) hours. 10 capsule Maxon Kresse-Warren, Sadie Haber, NP      PDMP not reviewed this encounter.   Abran Cantor, NP 09/14/22 1645

## 2022-09-14 NOTE — Discharge Instructions (Addendum)
We have tested you today for COVID-19 and strep throat.  The rapid strep test is negative.  For your COVID test, you will see the results in Mychart and we will call you with positive results.    Please stay home and isolate until you are aware of the results.     Some things that can make you feel better are: - Increased rest - Increasing fluid with water/sugar free electrolytes - Acetaminophen and ibuprofen as needed for fever/pain - Salt water gargling, chloraseptic spray and throat lozenges - OTC guaifenesin (Mucinex) 600 mg twice daily - Saline sinus flushes or a neti pot - Humidifying the air  It is safe for you to continue breast-feeding while taking the Tamiflu.  A viral illness may from 7 to 14 days.  If your symptoms worsen before that time, or extend beyond that timeframe, please follow-up with your primary care physician for further evaluation.

## 2022-09-14 NOTE — ED Triage Notes (Signed)
Pt reports cough, sore throat x2 days.denies fever. Pt reports close exposure to flu and reports has 64 week old newborn at home.

## 2022-09-15 LAB — SARS CORONAVIRUS 2 (TAT 6-24 HRS): SARS Coronavirus 2: NEGATIVE

## 2022-09-25 ENCOUNTER — Ambulatory Visit: Payer: 59 | Admitting: Women's Health

## 2022-09-26 DIAGNOSIS — Z1389 Encounter for screening for other disorder: Secondary | ICD-10-CM | POA: Diagnosis not present

## 2022-09-26 DIAGNOSIS — Z3009 Encounter for other general counseling and advice on contraception: Secondary | ICD-10-CM | POA: Diagnosis not present

## 2022-10-02 ENCOUNTER — Encounter: Payer: Self-pay | Admitting: Family Medicine

## 2022-10-02 ENCOUNTER — Ambulatory Visit: Payer: 59 | Admitting: Family Medicine

## 2022-11-01 DIAGNOSIS — H5213 Myopia, bilateral: Secondary | ICD-10-CM | POA: Diagnosis not present

## 2022-11-01 DIAGNOSIS — H52223 Regular astigmatism, bilateral: Secondary | ICD-10-CM | POA: Diagnosis not present

## 2022-12-29 ENCOUNTER — Encounter (HOSPITAL_COMMUNITY): Payer: Self-pay | Admitting: *Deleted

## 2022-12-29 ENCOUNTER — Ambulatory Visit (HOSPITAL_COMMUNITY)
Admission: EM | Admit: 2022-12-29 | Discharge: 2022-12-30 | Disposition: A | Discharge status: Home / Self Care | Payer: 59 | Attending: General Surgery | Admitting: General Surgery

## 2022-12-29 ENCOUNTER — Other Ambulatory Visit: Payer: Self-pay

## 2022-12-29 DIAGNOSIS — E119 Type 2 diabetes mellitus without complications: Secondary | ICD-10-CM | POA: Diagnosis not present

## 2022-12-29 DIAGNOSIS — K8012 Calculus of gallbladder with acute and chronic cholecystitis without obstruction: Secondary | ICD-10-CM | POA: Insufficient documentation

## 2022-12-29 DIAGNOSIS — Z6841 Body Mass Index (BMI) 40.0 and over, adult: Secondary | ICD-10-CM | POA: Insufficient documentation

## 2022-12-29 DIAGNOSIS — K819 Cholecystitis, unspecified: Secondary | ICD-10-CM

## 2022-12-29 LAB — COMPREHENSIVE METABOLIC PANEL
ALT: 15 U/L (ref 0–44)
AST: 15 U/L (ref 15–41)
Albumin: 3.6 g/dL (ref 3.5–5.0)
Alkaline Phosphatase: 80 U/L (ref 38–126)
Anion gap: 11 (ref 5–15)
BUN: 13 mg/dL (ref 6–20)
CO2: 26 mmol/L (ref 22–32)
Calcium: 9.7 mg/dL (ref 8.9–10.3)
Chloride: 100 mmol/L (ref 98–111)
Creatinine, Ser: 0.83 mg/dL (ref 0.44–1.00)
GFR, Estimated: >60 mL/min (ref >60)
Glucose, Bld: 98 mg/dL (ref 70–99)
Potassium: 4.1 mmol/L (ref 3.5–5.1)
Sodium: 137 mmol/L (ref 135–145)
Total Bilirubin: 0.1 mg/dL — ABNORMAL LOW (ref 0.3–1.2)
Total Protein: 7.3 g/dL (ref 6.5–8.1)

## 2022-12-29 LAB — URINALYSIS, ROUTINE W REFLEX MICROSCOPIC
Bilirubin Urine: NEGATIVE
Glucose, UA: NEGATIVE mg/dL
Hgb urine dipstick: NEGATIVE
Ketones, ur: NEGATIVE mg/dL
Leukocytes,Ua: NEGATIVE
Nitrite: NEGATIVE
Protein, ur: NEGATIVE mg/dL
Specific Gravity, Urine: 1.027 (ref 1.005–1.030)
pH: 5 (ref 5.0–8.0)

## 2022-12-29 LAB — I-STAT BETA HCG BLOOD, ED (MC, WL, AP ONLY): I-stat hCG, quantitative: 5 m[IU]/mL — ABNORMAL HIGH (ref <5)

## 2022-12-29 LAB — CBC
HCT: 38.9 % (ref 36.0–46.0)
Hemoglobin: 12.6 g/dL (ref 12.0–15.0)
MCH: 26.2 pg (ref 26.0–34.0)
MCHC: 32.4 g/dL (ref 30.0–36.0)
MCV: 80.9 fL (ref 80.0–100.0)
Platelets: 418 10*3/uL — ABNORMAL HIGH (ref 150–400)
RBC: 4.81 MIL/uL (ref 3.87–5.11)
RDW: 14 % (ref 11.5–15.5)
WBC: 14.2 10*3/uL — ABNORMAL HIGH (ref 4.0–10.5)
nRBC: 0 % (ref 0.0–0.2)

## 2022-12-29 LAB — LIPASE, BLOOD: Lipase: 44 U/L (ref 11–51)

## 2022-12-29 NOTE — ED Triage Notes (Signed)
The pt is c/o abd pain for 3-4 days nausea  no vomiting or diarrhea  she was told that she had gallstones in December when she delivered her last baby  lmp one week ago

## 2022-12-30 ENCOUNTER — Emergency Department (HOSPITAL_BASED_OUTPATIENT_CLINIC_OR_DEPARTMENT_OTHER): Payer: 59 | Admitting: Anesthesiology

## 2022-12-30 ENCOUNTER — Encounter (HOSPITAL_COMMUNITY): Payer: Self-pay | Admitting: Orthopedic Surgery

## 2022-12-30 ENCOUNTER — Encounter (HOSPITAL_COMMUNITY)
Admission: EM | Disposition: A | Discharge status: Home / Self Care | Payer: Self-pay | Source: Home / Self Care | Attending: Emergency Medicine

## 2022-12-30 ENCOUNTER — Emergency Department (HOSPITAL_COMMUNITY): Payer: 59 | Admitting: Anesthesiology

## 2022-12-30 ENCOUNTER — Other Ambulatory Visit: Payer: Self-pay

## 2022-12-30 ENCOUNTER — Emergency Department (HOSPITAL_COMMUNITY): Payer: 59

## 2022-12-30 DIAGNOSIS — E119 Type 2 diabetes mellitus without complications: Secondary | ICD-10-CM

## 2022-12-30 DIAGNOSIS — Z6841 Body Mass Index (BMI) 40.0 and over, adult: Secondary | ICD-10-CM

## 2022-12-30 DIAGNOSIS — K8000 Calculus of gallbladder with acute cholecystitis without obstruction: Secondary | ICD-10-CM | POA: Diagnosis not present

## 2022-12-30 DIAGNOSIS — K8012 Calculus of gallbladder with acute and chronic cholecystitis without obstruction: Secondary | ICD-10-CM | POA: Diagnosis not present

## 2022-12-30 HISTORY — PX: CHOLECYSTECTOMY: SHX55

## 2022-12-30 SURGERY — LAPAROSCOPIC CHOLECYSTECTOMY
Anesthesia: General

## 2022-12-30 MED ORDER — ORAL CARE MOUTH RINSE: 15.0000 mL | Freq: Once | OROMUCOSAL | Status: AC

## 2022-12-30 MED ORDER — PHENYLEPHRINE 80 MCG/ML (10ML) SYRINGE FOR IV PUSH (FOR BLOOD PRESSURE SUPPORT)
PREFILLED_SYRINGE | INTRAVENOUS | Status: DC | PRN
  Administered 2022-12-30: 160 ug via INTRAVENOUS

## 2022-12-30 MED ORDER — ROCURONIUM BROMIDE 10 MG/ML (PF) SYRINGE
PREFILLED_SYRINGE | INTRAVENOUS | Status: DC | PRN
  Administered 2022-12-30: 65 mg via INTRAVENOUS
  Administered 2022-12-30: 5 mg via INTRAVENOUS

## 2022-12-30 MED ORDER — PROPOFOL 10 MG/ML IV BOLUS
INTRAVENOUS | Status: AC
  Filled 2022-12-30: qty 20

## 2022-12-30 MED ORDER — 0.9 % SODIUM CHLORIDE (POUR BTL) OPTIME
TOPICAL | Status: DC | PRN
  Administered 2022-12-30: 1000 mL

## 2022-12-30 MED ORDER — FENTANYL CITRATE (PF) 100 MCG/2ML IJ SOLN: 25.0000 ug | INTRAMUSCULAR | Status: DC | PRN

## 2022-12-30 MED ORDER — ACETAMINOPHEN 10 MG/ML IV SOLN
INTRAVENOUS | Status: AC
  Filled 2022-12-30: qty 100

## 2022-12-30 MED ORDER — LIDOCAINE HCL 1 % IJ SOLN
INTRAMUSCULAR | Status: DC | PRN
  Administered 2022-12-30: 15 mL via INTRAMUSCULAR

## 2022-12-30 MED ORDER — ONDANSETRON HCL 4 MG/2ML IJ SOLN
INTRAMUSCULAR | Status: AC
  Filled 2022-12-30: qty 2

## 2022-12-30 MED ORDER — PROPOFOL 10 MG/ML IV BOLUS
INTRAVENOUS | Status: DC | PRN
  Administered 2022-12-30: 200 mg via INTRAVENOUS

## 2022-12-30 MED ORDER — CHLORHEXIDINE GLUCONATE 0.12 % MT SOLN
OROMUCOSAL | Status: AC
  Administered 2022-12-30: 15 mL via OROMUCOSAL
  Filled 2022-12-30: qty 15

## 2022-12-30 MED ORDER — CEFAZOLIN SODIUM-DEXTROSE 2-4 GM/100ML-% IV SOLN
2.0000 g | INTRAVENOUS | Status: AC
  Administered 2022-12-30: 2 g via INTRAVENOUS

## 2022-12-30 MED ORDER — ROCURONIUM BROMIDE 10 MG/ML (PF) SYRINGE
PREFILLED_SYRINGE | INTRAVENOUS | Status: AC
  Filled 2022-12-30: qty 10

## 2022-12-30 MED ORDER — CHLORHEXIDINE GLUCONATE 0.12 % MT SOLN: 15.0000 mL | Freq: Once | OROMUCOSAL | Status: AC

## 2022-12-30 MED ORDER — LACTATED RINGERS IV SOLN: INTRAVENOUS | Status: DC

## 2022-12-30 MED ORDER — HYDROMORPHONE HCL 1 MG/ML IJ SOLN: 1.0000 mg | INTRAMUSCULAR | Status: DC | PRN

## 2022-12-30 MED ORDER — DEXAMETHASONE SODIUM PHOSPHATE 10 MG/ML IJ SOLN
INTRAMUSCULAR | Status: DC | PRN
  Administered 2022-12-30: 10 mg via INTRAVENOUS

## 2022-12-30 MED ORDER — SODIUM CHLORIDE 0.9 % IR SOLN
Status: DC | PRN
  Administered 2022-12-30: 1000 mL

## 2022-12-30 MED ORDER — ACETAMINOPHEN 10 MG/ML IV SOLN
INTRAVENOUS | Status: DC | PRN
  Administered 2022-12-30: 1000 mg via INTRAVENOUS

## 2022-12-30 MED ORDER — LIDOCAINE 2% (20 MG/ML) 5 ML SYRINGE
INTRAMUSCULAR | Status: DC | PRN
  Administered 2022-12-30: 60 mg via INTRAVENOUS

## 2022-12-30 MED ORDER — OXYCODONE HCL 5 MG PO TABS
5.0000 mg | ORAL_TABLET | Freq: Once | ORAL | Status: AC | PRN
  Administered 2022-12-30: 5 mg via ORAL

## 2022-12-30 MED ORDER — BUPIVACAINE-EPINEPHRINE (PF) 0.25% -1:200000 IJ SOLN
INTRAMUSCULAR | Status: AC
  Filled 2022-12-30: qty 30

## 2022-12-30 MED ORDER — FENTANYL CITRATE (PF) 250 MCG/5ML IJ SOLN
INTRAMUSCULAR | Status: DC | PRN
  Administered 2022-12-30: 100 ug via INTRAVENOUS
  Administered 2022-12-30: 50 ug via INTRAVENOUS

## 2022-12-30 MED ORDER — MIDAZOLAM HCL 2 MG/2ML IJ SOLN
INTRAMUSCULAR | Status: AC
  Filled 2022-12-30: qty 2

## 2022-12-30 MED ORDER — OXYCODONE HCL 5 MG/5ML PO SOLN: 5.0000 mg | Freq: Once | ORAL | Status: AC | PRN

## 2022-12-30 MED ORDER — DEXAMETHASONE SODIUM PHOSPHATE 10 MG/ML IJ SOLN
INTRAMUSCULAR | Status: AC
  Filled 2022-12-30: qty 1

## 2022-12-30 MED ORDER — ONDANSETRON HCL 4 MG/2ML IJ SOLN
INTRAMUSCULAR | Status: DC | PRN
  Administered 2022-12-30: 4 mg via INTRAVENOUS

## 2022-12-30 MED ORDER — LIDOCAINE HCL (PF) 1 % IJ SOLN
INTRAMUSCULAR | Status: AC
  Filled 2022-12-30: qty 30

## 2022-12-30 MED ORDER — HYDROMORPHONE HCL 1 MG/ML IJ SOLN
0.5000 mg | Freq: Once | INTRAMUSCULAR | Status: AC
  Administered 2022-12-30: 0.5 mg via INTRAVENOUS
  Filled 2022-12-30: qty 1

## 2022-12-30 MED ORDER — CEFAZOLIN SODIUM-DEXTROSE 2-4 GM/100ML-% IV SOLN
INTRAVENOUS | Status: AC
  Filled 2022-12-30: qty 100

## 2022-12-30 MED ORDER — AMISULPRIDE (ANTIEMETIC) 5 MG/2ML IV SOLN: 10.0000 mg | Freq: Once | INTRAVENOUS | Status: DC | PRN

## 2022-12-30 MED ORDER — OXIDIZED CELLULOSE EX PADS
MEDICATED_PAD | CUTANEOUS | Status: DC | PRN
  Administered 2022-12-30: 1 via TOPICAL

## 2022-12-30 MED ORDER — INDOCYANINE GREEN 25 MG IV SOLR
INTRAVENOUS | Status: DC | PRN
  Administered 2022-12-30: 12.5 mg via INTRAVENOUS

## 2022-12-30 MED ORDER — FENTANYL CITRATE (PF) 250 MCG/5ML IJ SOLN
INTRAMUSCULAR | Status: AC
  Filled 2022-12-30: qty 5

## 2022-12-30 MED ORDER — OXYCODONE HCL 5 MG PO TABS
ORAL_TABLET | ORAL | Status: AC
  Filled 2022-12-30: qty 1

## 2022-12-30 MED ORDER — LIDOCAINE 2% (20 MG/ML) 5 ML SYRINGE
INTRAMUSCULAR | Status: AC
  Filled 2022-12-30: qty 5

## 2022-12-30 MED ORDER — OXYCODONE HCL 5 MG PO TABS: 5.0000 mg | ORAL_TABLET | Freq: Four times a day (QID) | ORAL | 0 refills | Status: DC | PRN

## 2022-12-30 MED ORDER — HYDROMORPHONE HCL 1 MG/ML IJ SOLN
1.0000 mg | INTRAMUSCULAR | Status: DC | PRN
  Administered 2022-12-30: 1 mg via INTRAVENOUS
  Filled 2022-12-30: qty 1

## 2022-12-30 MED ORDER — SUCCINYLCHOLINE 20MG/ML (10ML) SYRINGE FOR MEDFUSION PUMP - OPTIME
INTRAMUSCULAR | Status: DC | PRN
  Administered 2022-12-30: 120 mg via INTRAVENOUS

## 2022-12-30 SURGICAL SUPPLY — 43 items
APPLIER CLIP ROT 10 11.4 M/L (STAPLE) ×1
BAG COUNTER SPONGE SURGICOUNT (BAG) ×1 IMPLANT
BLADE CLIPPER SURG (BLADE) IMPLANT
CANISTER SUCT 3000ML PPV (MISCELLANEOUS) ×1 IMPLANT
CHLORAPREP W/TINT 26 (MISCELLANEOUS) ×1 IMPLANT
CLIP APPLIE ROT 10 11.4 M/L (STAPLE) ×1 IMPLANT
COVER SURGICAL LIGHT HANDLE (MISCELLANEOUS) ×1 IMPLANT
DERMABOND ADVANCED .7 DNX12 (GAUZE/BANDAGES/DRESSINGS) ×1 IMPLANT
DRAPE WARM FLUID 44X44 (DRAPES) ×1 IMPLANT
ELECT REM PT RETURN 9FT ADLT (ELECTROSURGICAL) ×1
ELECTRODE REM PT RTRN 9FT ADLT (ELECTROSURGICAL) ×1 IMPLANT
FILTER SMOKE EVAC LAPAROSHD (FILTER) IMPLANT
GLOVE BIO SURGEON STRL SZ 6 (GLOVE) ×1 IMPLANT
GLOVE INDICATOR 6.5 STRL GRN (GLOVE) ×1 IMPLANT
GOWN STRL REUS W/ TWL LRG LVL3 (GOWN DISPOSABLE) ×2 IMPLANT
GOWN STRL REUS W/ TWL XL LVL3 (GOWN DISPOSABLE) ×1 IMPLANT
GOWN STRL REUS W/TWL LRG LVL3 (GOWN DISPOSABLE) ×2
GOWN STRL REUS W/TWL XL LVL3 (GOWN DISPOSABLE) ×1
IRRIG SUCT STRYKERFLOW 2 WTIP (MISCELLANEOUS) ×1
IRRIGATION SUCT STRKRFLW 2 WTP (MISCELLANEOUS) ×1 IMPLANT
KIT BASIN OR (CUSTOM PROCEDURE TRAY) ×1 IMPLANT
KIT TURNOVER KIT B (KITS) ×1 IMPLANT
L-HOOK LAP DISP 36CM (ELECTROSURGICAL) ×1
LHOOK LAP DISP 36CM (ELECTROSURGICAL) ×1 IMPLANT
NS IRRIG 1000ML POUR BTL (IV SOLUTION) ×1 IMPLANT
PAD ARMBOARD 7.5X6 YLW CONV (MISCELLANEOUS) ×1 IMPLANT
PENCIL BUTTON HOLSTER BLD 10FT (ELECTRODE) ×1 IMPLANT
POUCH RETRIEVAL ECOSAC 10 (ENDOMECHANICALS) ×1 IMPLANT
POUCH RETRIEVAL ECOSAC 10MM (ENDOMECHANICALS) ×1
SCISSORS LAP 5X35 DISP (ENDOMECHANICALS) ×1 IMPLANT
SET TUBE SMOKE EVAC HIGH FLOW (TUBING) ×1 IMPLANT
SLEEVE Z-THREAD 5X100MM (TROCAR) ×1 IMPLANT
SPECIMEN JAR SMALL (MISCELLANEOUS) ×1 IMPLANT
SPIKE FLUID TRANSFER (MISCELLANEOUS) ×1 IMPLANT
SUT MNCRL AB 4-0 PS2 18 (SUTURE) ×1 IMPLANT
TOWEL GREEN STERILE (TOWEL DISPOSABLE) ×1 IMPLANT
TOWEL GREEN STERILE FF (TOWEL DISPOSABLE) ×1 IMPLANT
TRAY LAPAROSCOPIC MC (CUSTOM PROCEDURE TRAY) ×1 IMPLANT
TROCAR 11X100 Z THREAD (TROCAR) ×1 IMPLANT
TROCAR BALLN 12MMX100 BLUNT (TROCAR) ×1 IMPLANT
TROCAR Z-THREAD OPTICAL 5X100M (TROCAR) ×1 IMPLANT
WARMER LAPAROSCOPE (MISCELLANEOUS) ×1 IMPLANT
WATER STERILE IRR 1000ML POUR (IV SOLUTION) ×1 IMPLANT

## 2022-12-30 NOTE — Interval H&P Note (Signed)
History and Physical Interval Note:  12/30/2022 10:42 AM  Susan Branch  has presented today for surgery, with the diagnosis of cholecystitis.  The various methods of treatment have been discussed with the patient and family. After consideration of risks, benefits and other options for treatment, the patient has consented to  Procedure(s): LAPAROSCOPIC CHOLECYSTECTOMY (N/A) INDOCYANINE GREEN FLUORESCENCE IMAGING (ICG) (N/A) as a surgical intervention.  The patient's history has been reviewed, patient examined, no change in status, stable for surgery.  I have reviewed the patient's chart and labs.  Questions were answered to the patient's satisfaction.     Almond Lint

## 2022-12-30 NOTE — Discharge Instructions (Signed)
LAPAROSCOPIC SURGERY: POST OP INSTRUCTIONS Always review your discharge instruction sheet given to you by the facility where your surgery was performed. IF YOU HAVE DISABILITY OR FAMILY LEAVE FORMS, YOU MUST BRING THEM TO THE OFFICE FOR PROCESSING.   DO NOT GIVE THEM TO YOUR DOCTOR.  PAIN CONTROL  First take acetaminophen (Tylenol) AND/or ibuprofen (Advil) to control your pain after surgery.  Follow directions on package.  Taking acetaminophen (Tylenol) and/or ibuprofen (Advil) regularly after surgery will help to control your pain and lower the amount of prescription pain medication you may need.  You should not take more than 3,000 mg (3 grams) of acetaminophen (Tylenol) in 24 hours.  You should not take ibuprofen (Advil), aleve, motrin, naprosyn or other NSAIDS if you have a history of stomach ulcers or chronic kidney disease.  A prescription for pain medication may be given to you upon discharge.  Take your pain medication as prescribed, if you still have uncontrolled pain after taking acetaminophen (Tylenol) or ibuprofen (Advil). Use ice packs to help control pain. If you need a refill on your pain medication, please contact your pharmacy.  They will contact our office to request authorization. Prescriptions will not be filled after 5pm or on week-ends.  HOME MEDICATIONS Take your usually prescribed medications unless otherwise directed.  DIET You should follow a light diet the first few days after arrival home.  Be sure to include lots of fluids daily. Avoid fatty, fried foods.   CONSTIPATION It is common to experience some constipation after surgery and if you are taking pain medication.  Increasing fluid intake and taking a stool softener (such as Colace) will usually help or prevent this problem from occurring.  A mild laxative (Milk of Magnesia or Miralax) should be taken according to package instructions if there are no bowel movements after 48 hours.  WOUND/INCISION CARE Most  patients will experience some swelling and bruising in the area of the incisions.  Ice packs will help.  Swelling and bruising can take several days to resolve.  Unless discharge instructions indicate otherwise, follow guidelines below  STERI-STRIPS - you may remove your outer bandages 48 hours after surgery, and you may shower at that time.  You have steri-strips (small skin tapes) in place directly over the incision.  These strips should be left on the skin for 7-10 days.   DERMABOND/SKIN GLUE - you may shower in 24 hours.  The glue will flake off over the next 2-3 weeks. Any sutures or staples will be removed at the office during your follow-up visit.  ACTIVITIES You may resume regular (light) daily activities beginning the next day--such as daily self-care, walking, climbing stairs--gradually increasing activities as tolerated.  You may have sexual intercourse when it is comfortable.  Refrain from any heavy lifting or straining until approved by your doctor. You may drive when you are no longer taking prescription pain medication, you can comfortably wear a seatbelt, and you can safely maneuver your car and apply brakes.  FOLLOW-UP You should see your doctor in the office for a follow-up appointment approximately 2-3 weeks after your surgery.  You should have been given your post-op/follow-up appointment when your surgery was scheduled.  If you did not receive a post-op/follow-up appointment, make sure that you call for this appointment within a day or two after you arrive home to insure a convenient appointment time.    WHEN TO CALL YOUR DOCTOR: Fever over 101.0 Inability to urinate Continued bleeding from incision. Increased pain, redness, or  drainage from the incision. Increasing abdominal pain  The clinic staff is available to answer your questions during regular business hours.  Please don't hesitate to call and ask to speak to one of the nurses for clinical concerns.  If you have a  medical emergency, go to the nearest emergency room or call 911.  A surgeon from [facility] is always on call at the hospital. [address], [place], [place]  [address] ? [address] [address], [place], [place]   [address] [phone] ? [phone] ? FAX [phone] Web site: [email or web address]      Managing Your Pain After Surgery Without Opioids    Thank you for participating in our program to help patients manage their pain after surgery without opioids. This is part of our effort to provide you with the best care possible, without exposing you or your family to the risk that opioids pose.  What pain can I expect after surgery? You can expect to have some pain after surgery. This is normal. The pain is typically worse the day after surgery, and quickly begins to get better. Many studies have found that many patients are able to manage their pain after surgery with Over-the-Counter (OTC) medications such as Tylenol and Motrin. If you have a condition that does not allow you to take Tylenol or Motrin, notify your surgical team.  How will I manage my pain? The best strategy for controlling your pain after surgery is around the clock pain control with Tylenol (acetaminophen) and Motrin (ibuprofen or Advil). Alternating these medications with each other allows you to maximize your pain control. In addition to Tylenol and Motrin, you can use heating pads or ice packs on your incisions to help reduce your pain.  How will I alternate your regular strength over-the-counter pain medication? You will take a dose of pain medication every three hours. Start by taking 650 mg of Tylenol (2 pills of 325 mg) 3 hours later take 600 mg of Motrin (3 pills of 200 mg) 3 hours after taking the Motrin take 650 mg of Tylenol 3 hours after that take 600 mg of Motrin.   - 1 -  See example - if your first dose of Tylenol is at 12:00 PM   12:00 PM Tylenol 650 mg (2  pills of 325 mg)  3:00 PM Motrin 600 mg (3 pills of 200 mg)  6:00 PM Tylenol 650 mg (2 pills of 325 mg)  9:00 PM Motrin 600 mg (3 pills of 200 mg)  Continue alternating every 3 hours   We recommend that you follow this schedule around-the-clock for at least 3 days after surgery, or until you feel that it is no longer needed. Use the table on the last page of this handout to keep track of the medications you are taking. Important: Do not take more than 3000mg  of Tylenol or 3200mg  of Motrin in a 24-hour period. Do not take ibuprofen/Motrin if you have a history of bleeding stomach ulcers, severe kidney disease, &/or actively taking a blood thinner  What if I still have pain? If you have pain that is not controlled with the over-the-counter pain medications (Tylenol and Motrin or Advil) you might have what we call "breakthrough" pain. You will receive a prescription for a small amount of an opioid pain medication such as Oxycodone, Tramadol, or Tylenol with Codeine. Use these opioid pills in the first 24 hours after surgery if you have breakthrough pain. Do not take more than  1 pill every 4-6 hours.  If you still have uncontrolled pain after using all opioid pills, don't hesitate to call our staff using the number provided. We will help make sure you are managing your pain in the best way possible, and if necessary, we can provide a prescription for additional pain medication.   Day 1    Time  Name of Medication Number of pills taken  Amount of Acetaminophen  Pain Level   Comments  AM PM       AM PM       AM PM       AM PM       AM PM       AM PM       AM PM       AM PM       Total Daily amount of Acetaminophen Do not take more than  3,000 mg per day      Day 2    Time  Name of Medication Number of pills taken  Amount of Acetaminophen  Pain Level   Comments  AM PM       AM PM       AM PM       AM PM       AM PM       AM PM       AM PM       AM PM       Total Daily  amount of Acetaminophen Do not take more than  3,000 mg per day      Day 3    Time  Name of Medication Number of pills taken  Amount of Acetaminophen  Pain Level   Comments  AM PM       AM PM       AM PM       AM PM         AM PM       AM PM       AM PM       AM PM       Total Daily amount of Acetaminophen Do not take more than  3,000 mg per day      Day 4    Time  Name of Medication Number of pills taken  Amount of Acetaminophen  Pain Level   Comments  AM PM       AM PM       AM PM       AM PM       AM PM       AM PM       AM PM       AM PM       Total Daily amount of Acetaminophen Do not take more than  3,000 mg per day      Day 5    Time  Name of Medication Number of pills taken  Amount of Acetaminophen  Pain Level   Comments  AM PM       AM PM       AM PM       AM PM       AM PM       AM PM       AM PM       AM PM       Total Daily amount of Acetaminophen Do not take more than  3,000 mg per day  Day 6    Time  Name of Medication Number of pills taken  Amount of Acetaminophen  Pain Level  Comments  AM PM       AM PM       AM PM       AM PM       AM PM       AM PM       AM PM       AM PM       Total Daily amount of Acetaminophen Do not take more than  3,000 mg per day      Day 7    Time  Name of Medication Number of pills taken  Amount of Acetaminophen  Pain Level   Comments  AM PM       AM PM       AM PM       AM PM       AM PM       AM PM       AM PM       AM PM       Total Daily amount of Acetaminophen Do not take more than  3,000 mg per day        For additional information about how and where to safely dispose of unused opioid medications - [employer]  Disclaimer: This document contains information and/or instructional materials adapted from [facility] for the typical patient with your condition. It does not replace medical advice from your health care provider because  your experience may differ from that of the typical patient. Talk to your health care provider if you have any questions about this document, your condition or your treatment plan. Adapted from [facility]

## 2022-12-30 NOTE — Transfer of Care (Signed)
Immediate Anesthesia Transfer of Care Note  Patient: Susan Branch  Procedure(s) Performed: LAPAROSCOPIC CHOLECYSTECTOMY INDOCYANINE GREEN FLUORESCENCE IMAGING (ICG)  Patient Location: PACU  Anesthesia Type:General  Level of Consciousness: awake, alert , and drowsy  Airway & Oxygen Therapy: Patient Spontanous Breathing and Patient connected to nasal cannula oxygen  Post-op Assessment: Report given to RN, Post -op Vital signs reviewed and stable, and Patient moving all extremities X 4  Post vital signs: Reviewed and stable  Last Vitals:  Vitals Value Taken Time  BP 120/77 12/30/22 1254  Temp 36.4 C 12/30/22 1254  Pulse 87 12/30/22 1257  Resp 23 12/30/22 1257  SpO2 92 % 12/30/22 1257  Vitals shown include unvalidated device data.  Last Pain:  Vitals:   12/30/22 1054  TempSrc: Oral  PainSc:          Complications: No notable events documented.

## 2022-12-30 NOTE — Consult Note (Signed)
  Reason for Consult/Chief Complaint: acute cholecystitis Consultant: Browning, PA  Susan Branch is an 27 y.o. female.   HPI: 27F with RUQ abdominal pain and associated nausea x3d. Prior similar symptoms in Nov 2023 and Apr/May 2023 that she was seen in the ED. Delivered a healthy baby boy in December 2023 via CS. No other abdominal surgeries. No current contraceptive use, but reports LMP last week that was abnormal and longer than typical.   Past Medical History:  Diagnosis Date   Allergy    Anxiety    Borderline hypertension    no current med.   Diabetes mellitus without complication    Gestational diabetes    Headache    Tonsillar and adenoid hypertrophy 05/2013   snores during sleep, mother denies apnea    Past Surgical History:  Procedure Laterality Date   CESAREAN SECTION N/A 08/15/2022   Procedure: CESAREAN SECTION;  Surgeon: Banga, Cecilia Worema, DO;  Location: [facility];  Service: Obstetrics;  Laterality: N/A;   TONSILLECTOMY AND ADENOIDECTOMY N/A 06/02/2013   Procedure: TONSILLECTOMY AND ADENOIDECTOMY;  Surgeon: Sui W Teoh, MD;  Location: [facility];  Service: ENT;  Laterality: N/A;   TURBINATE REDUCTION Bilateral 06/02/2013   Procedure: TURBINATE REDUCTION BILATERAL;  Surgeon: Sui W Teoh, MD;  Location: [facility];  Service: ENT;  Laterality: Bilateral;    Family History  Problem Relation Age of Onset   Diabetes Mother    Depression Father    Diabetes Father    Hypertension Father    Heart disease Father        MI   Hyperlipidemia Father    Kidney disease Father    Vision loss Father    Bipolar disorder Father    Vision loss Maternal Grandmother    Stroke Maternal Grandmother    Diabetes Paternal Grandmother    Kidney failure Paternal Grandmother    Diabetes Paternal Grandfather    Kidney failure Paternal Grandfather    Muscular dystrophy Paternal Grandfather     Social History:  reports that she has never smoked. She has  been exposed to tobacco smoke. She has never used smokeless tobacco. She reports that she does not currently use drugs after having used the following drugs: Marijuana. Frequency: 4.00 times per week. She reports that she does not drink alcohol.  Allergies:  Allergies  Allergen Reactions   Adhesive [Tape] Rash   Latex Rash   Soap Rash    Medications: I have reviewed the patient's current medications.  Results for orders placed or performed during the hospital encounter of 12/29/22 (from the past 48 hour(s))  Lipase, blood     Status: None   Collection Time: 12/29/22  8:12 PM  Result Value Ref Range   Lipase 44 11 - 51 U/L    Comment: Performed at [facility], [address] [place], [place] [address]  Comprehensive metabolic panel     Status: Abnormal   Collection Time: 12/29/22  8:12 PM  Result Value Ref Range   Sodium 137 135 - 145 mmol/L   Potassium 4.1 3.5 - 5.1 mmol/L   Chloride 100 98 - 111 mmol/L   CO2 26 22 - 32 mmol/L   Glucose, Bld 98 70 - 99 mg/dL    Comment: Glucose reference range applies only to samples taken after fasting for at least 8 hours.   BUN 13 6 - 20 mg/dL   Creatinine, Ser 0.83 0.44 - 1.00 mg/dL     Calcium 9.7 8.9 - 10.3 mg/dL   Total Protein 7.3 6.5 - 8.1 g/dL   Albumin 3.6 3.5 - 5.0 g/dL   AST 15 15 - 41 U/L   ALT 15 0 - 44 U/L   Alkaline Phosphatase 80 38 - 126 U/L   Total Bilirubin 0.1 (L) 0.3 - 1.2 mg/dL   GFR, Estimated >60 >60 mL/min    Comment: (NOTE) Calculated using the CKD-EPI Creatinine Equation (2021)    Anion gap 11 5 - 15    Comment: Performed at [facility], [address] [place], [place] [address]  CBC     Status: Abnormal   Collection Time: 12/29/22  8:12 PM  Result Value Ref Range   WBC 14.2 (H) 4.0 - 10.5 K/uL   RBC 4.81 3.87 - 5.11 MIL/uL   Hemoglobin 12.6 12.0 - 15.0 g/dL   HCT 38.9 36.0 - 46.0 %   MCV 80.9 80.0 - 100.0 fL   MCH 26.2 26.0 - 34.0 pg   MCHC 32.4 30.0 - 36.0 g/dL   RDW 14.0 11.5 - 15.5 %    Platelets 418 (H) 150 - 400 K/uL   nRBC 0.0 0.0 - 0.2 %    Comment: Performed at [facility], [address] [place], [place] [address]  Urinalysis, Routine w reflex microscopic -Urine, Clean Catch     Status: None   Collection Time: 12/29/22  8:32 PM  Result Value Ref Range   Color, Urine YELLOW YELLOW   APPearance CLEAR CLEAR   Specific Gravity, Urine 1.027 1.005 - 1.030   pH 5.0 5.0 - 8.0   Glucose, UA NEGATIVE NEGATIVE mg/dL   Hgb urine dipstick NEGATIVE NEGATIVE   Bilirubin Urine NEGATIVE NEGATIVE   Ketones, ur NEGATIVE NEGATIVE mg/dL   Protein, ur NEGATIVE NEGATIVE mg/dL   Nitrite NEGATIVE NEGATIVE   Leukocytes,Ua NEGATIVE NEGATIVE    Comment: Performed at [facility], [address] [place],  [address]  I-Stat beta hCG blood, ED     Status: Abnormal   Collection Time: 12/29/22 10:10 PM  Result Value Ref Range   I-stat hCG, quantitative 5.0 (H) <5 mIU/mL   Comment 3            Comment:   GEST. AGE      CONC.  (mIU/mL)   <=1 WEEK        5 - 50     2 WEEKS       50 - 500     3 WEEKS       100 - 10,000     4 WEEKS     1,000 - 30,000        FEMALE AND NON-PREGNANT FEMALE:     LESS THAN 5 mIU/mL     US Abdomen Limited RUQ (LIVER/GB)  Result Date: 12/30/2022 CLINICAL DATA:  27-year-old female with history of postprandial right upper quadrant abdominal pain. EXAM: ULTRASOUND ABDOMEN LIMITED RIGHT UPPER QUADRANT COMPARISON:  Abdominal ultrasound 07/11/2022. FINDINGS: Gallbladder: Gallbladder appears nearly completely contracted. Within the gallbladder lumen there are echogenic foci with posterior acoustic shadowing measuring up to 2.4 cm in diameter, indicative of gallstones. There is also likely some amorphous echogenic nonshadowing material which likely reflects some sludge. Gallbladder wall thickness is slightly increased at 5 mm. No definite pericholecystic fluid. However, per report from the sonographer, there was a sonographic Murphy's sign on  examination. Common bile duct: Diameter: 4 mm. Liver: No focal lesion identified. Mild diffuse   increased hepatic echogenicity, indicative of a background of hepatic steatosis. Portal vein is patent on color Doppler imaging with normal direction of blood flow towards the liver. Other: None. IMPRESSION: 1. Cholelithiasis with imaging findings concerning for (but not definitive for) potential acute cholecystitis. Specifically, there is a sonographic Murphy's sign and some gallbladder wall thickening in the setting of indwelling stones and sludge. However, the gallbladder does not appear distended, there is no pericholecystic fluid and the wall thickening could be related to underdistention of the gallbladder. Surgical consultation is suggested. 2. Hepatic steatosis. Electronically Signed   By: Daniel  Entrikin M.D.   On: 12/30/2022 05:12    ROS 10 point review of systems is negative except as listed above in HPI.   Physical Exam Blood pressure 102/62, pulse 78, temperature 98 F (36.7 C), temperature source Oral, resp. rate 20, height 5' 4" (1.626 m), weight 108 kg, last menstrual period 11/21/2022, SpO2 99 %, currently breastfeeding. Constitutional: well-developed, well-nourished HEENT: pupils equal, round, reactive to light, 2mm b/l, moist conjunctiva, external inspection of ears and nose normal, hearing intact Oropharynx: normal oropharyngeal mucosa, normal dentition Neck: no thyromegaly, trachea midline, no midline cervical tenderness to palpation Chest: breath sounds equal bilaterally, normal respiratory effort, no midline or lateral chest wall tenderness to palpation/deformity Abdomen: soft, RUQ TTP, no bruising, no hepatosplenomegaly GU: normal female genitalia  Back: no wounds, no thoracic/lumbar spine tenderness to palpation, no thoracic/lumbar spine stepoffs Rectal: deferred Extremities: 2+ radial and pedal pulses bilaterally, intact motor and sensation bilateral UE and LE, no peripheral  edema MSK: unable to assess gait/station, no clubbing/cyanosis of fingers/toes, normal ROM of all four extremities Skin: warm, dry, no rashes Psych: normal memory, normal mood/affect     Assessment/Plan: 27F with acute cholecystitis vs symptomatic cholelithiasis, favor acute cholecystitis. Discussed with patient the beta-hCG of 5 which could be indicative of a new IUP in the setting of no contraception use or could be 2/2 SAB in light of her recent abnormal bleeding. Unlikely to be residual from prior pregnancy. Discussed likelihood of SAB in the first trimester. Offered operative vs non-operative management and patient requests some time to think about her decision. Recommend trending beta by her OB regardless of decision regarding surgery.    Nairi Oswald N. Edward Trevino, MD General and Trauma Surgery [facility]       

## 2022-12-30 NOTE — ED Provider Notes (Signed)
[facility]-EMERGENCY DEPT [facility] Emergency Department Provider Note MRN:  [id number]  Arrival date & time: 12/30/22     Chief Complaint   Abdominal Pain   History of Present Illness   Susan Branch is a 27 y.o. year-old female presents to the ED with chief complaint of right upper quadrant abdominal pain for the past 3 to 4 days.  She states that she has had similar back in the past, and reports having gallstones in December.  She denies any fevers or chills.  She does report nausea, but denies vomiting.  She states that the symptoms progressively worsened today so she came for evaluation.  Denies any successful treatments prior to arrival  History provided by patient.   Review of Systems  Pertinent positive and negative review of systems noted in HPI.    Physical Exam   Vitals:   12/30/22 1330 12/30/22 1430  BP: (!) 101/55 (!) 101/52  Pulse: 76 68  Resp: 17 20  Temp: 97.7 F (36.5 C)   SpO2: 94% 95%    CONSTITUTIONAL: Nontoxic-appearing, NAD NEURO:  Alert and oriented x 3, CN 3-12 grossly intact EYES:  eyes equal and reactive ENT/NECK:  Supple, no stridor  CARDIO: Normal rate, regular rhythm, appears well-perfused  PULM:  No respiratory distress, clear to auscultation GI/GU:  non-distended, right upper quadrant tenderness, positive Murphy sign on my exam MSK/SPINE:  No gross deformities, no edema, moves all extremities  SKIN:  no rash, atraumatic   *Additional and/or pertinent findings included in MDM below  Diagnostic and Interventional Summary    EKG Interpretation  Date/Time:    Ventricular Rate:    PR Interval:    QRS Duration:   QT Interval:    QTC Calculation:   R Axis:     Text Interpretation:         Labs Reviewed  COMPREHENSIVE METABOLIC PANEL - Abnormal; Notable for the following components:      Result Value   Total Bilirubin 0.1 (*)    All other components within normal limits  CBC - Abnormal; Notable for the following components:    WBC 14.2 (*)    Platelets 418 (*)    All other components within normal limits  I-STAT BETA HCG BLOOD, ED ([facility], [facility], [facility] ONLY) - Abnormal; Notable for the following components:   I-stat hCG, quantitative 5.0 (*)    All other components within normal limits  LIPASE, BLOOD  URINALYSIS, ROUTINE W REFLEX MICROSCOPIC  SURGICAL PATHOLOGY    US Abdomen Limited RUQ (LIVER/GB)  Final Result      Medications  HYDROmorphone (DILAUDID) injection 0.5 mg (0.5 mg Intravenous Given 12/30/22 0531)  ceFAZolin (ANCEF) IVPB 2g/100 mL premix (2 g Intravenous Given 12/30/22 1125)  ceFAZolin (ANCEF) 2-4 GM/100ML-% IVPB (  Override pull for Anesthesia 12/30/22 1133)  chlorhexidine (PERIDEX) 0.12 % solution 15 mL (15 mLs Mouth/Throat Given 12/30/22 1059)    Or  Oral care mouth rinse ( Mouth Rinse See Alternative 12/30/22 1059)  oxyCODONE (Oxy IR/ROXICODONE) immediate release tablet 5 mg (5 mg Oral Given 12/30/22 1316)    Or  oxyCODONE (ROXICODONE) 5 MG/5ML solution 5 mg ( Oral See Alternative 12/30/22 1316)     Procedures  /  Critical Care Procedures  ED Course and Medical Decision Making  I have reviewed the triage vital signs, the nursing notes, and pertinent available records from the EMR.  Social Determinants Affecting Complexity of Care: Patient has no clinically significant social determinants affecting this chief complaint.Marland Kitchen  ED Course:    Medical Decision Making Patient here with right upper quadrant pain.  Amount and/or Complexity of Data Reviewed Labs: ordered.    Details: Leukocytosis to 14.2 Normal LFTs Radiology: ordered and independent interpretation performed.    Details: Gallstones present, gallbladder wall thickening  Risk Prescription drug management. Decision regarding hospitalization.     Consultants: I consulted with Dr. Bedelia Person, from general surgery, who will evaluate the patient.   Treatment and Plan: Patient's exam and diagnostic results are concerning for  cholecystitis.  Feel that patient will need admission to the hospital for further treatment and evaluation.    Final Clinical Impressions(s) / ED Diagnoses     ICD-10-CM   1. Cholecystitis  K81.9       ED Discharge Orders          Ordered    oxyCODONE (OXY IR/ROXICODONE) 5 MG immediate release tablet  Every 6 hours PRN        12/30/22 1254    Diet - low sodium heart healthy        12/30/22 1254    Increase activity slowly        12/30/22 1254    Call MD for:  temperature >100.4        12/30/22 1254    Call MD for:  persistant nausea and vomiting        12/30/22 1254    Call MD for:  severe uncontrolled pain        12/30/22 1254    Call MD for:  redness, tenderness, or signs of infection (pain, swelling, redness, odor or green/yellow discharge around incision site)        12/30/22 1254    Call MD for:  difficulty breathing, headache or visual disturbances        12/30/22 1254    Call MD for:  hives        12/30/22 1254              Discharge Instructions Discussed with and Provided to Patient:     Discharge Instructions       LAPAROSCOPIC SURGERY: POST OP INSTRUCTIONS Always review your discharge instruction sheet given to you by the facility where your surgery was performed. IF YOU HAVE DISABILITY OR FAMILY LEAVE FORMS, YOU MUST BRING THEM TO THE OFFICE FOR PROCESSING.   DO NOT GIVE THEM TO YOUR DOCTOR.  PAIN CONTROL  First take acetaminophen (Tylenol) AND/or ibuprofen (Advil) to control your pain after surgery.  Follow directions on package.  Taking acetaminophen (Tylenol) and/or ibuprofen (Advil) regularly after surgery will help to control your pain and lower the amount of prescription pain medication you may need.  You should not take more than 3,000 mg (3 grams) of acetaminophen (Tylenol) in 24 hours.  You should not take ibuprofen (Advil), aleve, motrin, naprosyn or other NSAIDS if you have a history of stomach ulcers or chronic kidney disease.  A  prescription for pain medication may be given to you upon discharge.  Take your pain medication as prescribed, if you still have uncontrolled pain after taking acetaminophen (Tylenol) or ibuprofen (Advil). Use ice packs to help control pain. If you need a refill on your pain medication, please contact your pharmacy.  They will contact our office to request authorization. Prescriptions will not be filled after 5pm or on week-ends.  HOME MEDICATIONS Take your usually prescribed medications unless otherwise directed.  DIET You should follow a light diet the first few days after arrival home.  Be sure to include lots of fluids daily. Avoid fatty, fried foods.   CONSTIPATION It is common to experience some constipation after surgery and if you are taking pain medication.  Increasing fluid intake and taking a stool softener (such as Colace) will usually help or prevent this problem from occurring.  A mild laxative (Milk of Magnesia or Miralax) should be taken according to package instructions if there are no bowel movements after 48 hours.  WOUND/INCISION CARE Most patients will experience some swelling and bruising in the area of the incisions.  Ice packs will help.  Swelling and bruising can take several days to resolve.  Unless discharge instructions indicate otherwise, follow guidelines below  STERI-STRIPS - you may remove your outer bandages 48 hours after surgery, and you may shower at that time.  You have steri-strips (small skin tapes) in place directly over the incision.  These strips should be left on the skin for 7-10 days.   DERMABOND/SKIN GLUE - you may shower in 24 hours.  The glue will flake off over the next 2-3 weeks. Any sutures or staples will be removed at the office during your follow-up visit.  ACTIVITIES You may resume regular (light) daily activities beginning the next day--such as daily self-care, walking, climbing stairs--gradually increasing activities as tolerated.  You may  have sexual intercourse when it is comfortable.  Refrain from any heavy lifting or straining until approved by your doctor. You may drive when you are no longer taking prescription pain medication, you can comfortably wear a seatbelt, and you can safely maneuver your car and apply brakes.  FOLLOW-UP You should see your doctor in the office for a follow-up appointment approximately 2-3 weeks after your surgery.  You should have been given your post-op/follow-up appointment when your surgery was scheduled.  If you did not receive a post-op/follow-up appointment, make sure that you call for this appointment within a day or two after you arrive home to insure a convenient appointment time.    WHEN TO CALL YOUR DOCTOR: Fever over 101.0 Inability to urinate Continued bleeding from incision. Increased pain, redness, or drainage from the incision. Increasing abdominal pain  The clinic staff is available to answer your questions during regular business hours.  Please don't hesitate to call and ask to speak to one of the nurses for clinical concerns.  If you have a medical emergency, go to the nearest emergency room or call 911.  A surgeon from [facility] is always on call at the hospital. [address], [place], [place]  [address] ? [address] [address], [place], [place]   [address] [phone] ? [phone] ? FAX [phone] Web site: [email or web address]      Managing Your Pain After Surgery Without Opioids    Thank you for participating in our program to help patients manage their pain after surgery without opioids. This is part of our effort to provide you with the best care possible, without exposing you or your family to the risk that opioids pose.  What pain can I expect after surgery? You can expect to have some pain after surgery. This is normal. The pain is typically worse the day after surgery, and quickly begins to get better. Many studies have  found that many patients are able to manage their pain after surgery with Over-the-Counter (OTC) medications such as Tylenol and Motrin. If you have a condition that does not allow you to take Tylenol or Motrin, notify your surgical team.  How  will I manage my pain? The best strategy for controlling your pain after surgery is around the clock pain control with Tylenol (acetaminophen) and Motrin (ibuprofen or Advil). Alternating these medications with each other allows you to maximize your pain control. In addition to Tylenol and Motrin, you can use heating pads or ice packs on your incisions to help reduce your pain.  How will I alternate your regular strength over-the-counter pain medication? You will take a dose of pain medication every three hours. Start by taking 650 mg of Tylenol (2 pills of 325 mg) 3 hours later take 600 mg of Motrin (3 pills of 200 mg) 3 hours after taking the Motrin take 650 mg of Tylenol 3 hours after that take 600 mg of Motrin.   - 1 -  See example - if your first dose of Tylenol is at 12:00 PM   12:00 PM Tylenol 650 mg (2 pills of 325 mg)  3:00 PM Motrin 600 mg (3 pills of 200 mg)  6:00 PM Tylenol 650 mg (2 pills of 325 mg)  9:00 PM Motrin 600 mg (3 pills of 200 mg)  Continue alternating every 3 hours   We recommend that you follow this schedule around-the-clock for at least 3 days after surgery, or until you feel that it is no longer needed. Use the table on the last page of this handout to keep track of the medications you are taking. Important: Do not take more than  of Tylenol or  of Motrin in a 24-hour period. Do not take ibuprofen/Motrin if you have a history of bleeding stomach ulcers, severe kidney disease, &/or actively taking a blood thinner  What if I still have pain? If you have pain that is not controlled with the over-the-counter pain medications (Tylenol and Motrin or Advil) you might have what we call "breakthrough" pain. You will  receive a prescription for a small amount of an opioid pain medication such as Oxycodone, Tramadol, or Tylenol with Codeine. Use these opioid pills in the first 24 hours after surgery if you have breakthrough pain. Do not take more than 1 pill every 4-6 hours.  If you still have uncontrolled pain after using all opioid pills, don't hesitate to call our staff using the number provided. We will help make sure you are managing your pain in the best way possible, and if necessary, we can provide a prescription for additional pain medication.   Day 1    Time  Name of Medication Number of pills taken  Amount of Acetaminophen  Pain Level   Comments  AM PM       AM PM       AM PM       AM PM       AM PM       AM PM       AM PM       AM PM       Total Daily amount of Acetaminophen Do not take more than  3,000 mg per day      Day 2    Time  Name of Medication Number of pills taken  Amount of Acetaminophen  Pain Level   Comments  AM PM       AM PM       AM PM       AM PM       AM PM       AM PM  AM PM       AM PM       Total Daily amount of Acetaminophen Do not take more than  3,000 mg per day      Day 3    Time  Name of Medication Number of pills taken  Amount of Acetaminophen  Pain Level   Comments  AM PM       AM PM       AM PM       AM PM         AM PM       AM PM       AM PM       AM PM       Total Daily amount of Acetaminophen Do not take more than  3,000 mg per day      Day 4    Time  Name of Medication Number of pills taken  Amount of Acetaminophen  Pain Level   Comments  AM PM       AM PM       AM PM       AM PM       AM PM       AM PM       AM PM       AM PM       Total Daily amount of Acetaminophen Do not take more than  3,000 mg per day      Day 5    Time  Name of Medication Number of pills taken  Amount of Acetaminophen  Pain Level   Comments  AM PM       AM PM       AM PM       AM PM       AM PM       AM PM        AM PM       AM PM       Total Daily amount of Acetaminophen Do not take more than  3,000 mg per day      Day 6    Time  Name of Medication Number of pills taken  Amount of Acetaminophen  Pain Level  Comments  AM PM       AM PM       AM PM       AM PM       AM PM       AM PM       AM PM       AM PM       Total Daily amount of Acetaminophen Do not take more than  3,000 mg per day      Day 7    Time  Name of Medication Number of pills taken  Amount of Acetaminophen  Pain Level   Comments  AM PM       AM PM       AM PM       AM PM       AM PM       AM PM       AM PM       AM PM       Total Daily amount of Acetaminophen Do not take more than  3,000 mg per day        For additional information about how and where to safely dispose of unused opioid medications -  [email or web address]  Disclaimer: This document contains information and/or instructional materials adapted from [facility] for the typical patient with your condition. It does not replace medical advice from your health care provider because your experience may differ from that of the typical patient. Talk to your health care provider if you have any questions about this document, your condition or your treatment plan. Adapted from [facility]        Roxy Horseman, PA-C 12/30/22 2227    Mesner, Barbara Cower, MD 12/30/22 [phone]

## 2022-12-30 NOTE — Anesthesia Preprocedure Evaluation (Signed)
Anesthesia Evaluation  Patient identified by MRN, date of birth, ID band Patient awake    Reviewed: Allergy & Precautions, NPO status , Patient's Chart, lab work & pertinent test results  Airway Mallampati: II  TM Distance: >3 FB Neck ROM: Full    Dental   Pulmonary neg pulmonary ROS   breath sounds clear to auscultation       Cardiovascular negative cardio ROS  Rhythm:Regular Rate:Normal     Neuro/Psych  Headaches    GI/Hepatic negative GI ROS, Neg liver ROS,,,  Endo/Other  diabetes, Type 2  Morbid obesity  Renal/GU negative Renal ROS     Musculoskeletal   Abdominal   Peds  Hematology negative hematology ROS (+)   Anesthesia Other Findings   Reproductive/Obstetrics                             Anesthesia Physical Anesthesia Plan  ASA: 3  Anesthesia Plan: General   Post-op Pain Management: Ofirmev IV (intra-op)*   Induction: Intravenous  PONV Risk Score and Plan: 3 and Dexamethasone, Ondansetron and Treatment may vary due to age or medical condition  Airway Management Planned: Oral ETT  Additional Equipment:   Intra-op Plan:   Post-operative Plan: Extubation in OR  Informed Consent: I have reviewed the patients History and Physical, chart, labs and discussed the procedure including the risks, benefits and alternatives for the proposed anesthesia with the patient or authorized representative who has indicated his/her understanding and acceptance.     Dental advisory given  Plan Discussed with: CRNA  Anesthesia Plan Comments:        Anesthesia Quick Evaluation

## 2022-12-30 NOTE — H&P (View-Only) (Signed)
Reason for Consult/Chief Complaint: acute cholecystitis Consultant: Dahlia Client, [place]  Susan Branch is an 27 y.o. female.   HPI: 49F with RUQ abdominal pain and associated nausea x3d. Prior similar symptoms in Nov 2023 and Apr/May 2023 that she was seen in the ED. Delivered a healthy baby boy in December 2023 via CS. No other abdominal surgeries. No current contraceptive use, but reports LMP last week that was abnormal and longer than typical.   Past Medical History:  Diagnosis Date   Allergy    Anxiety    Borderline hypertension    no current med.   Diabetes mellitus without complication    Gestational diabetes    Headache    Tonsillar and adenoid hypertrophy 05/2013   snores during sleep, mother denies apnea    Past Surgical History:  Procedure Laterality Date   CESAREAN SECTION N/A 08/15/2022   Procedure: CESAREAN SECTION;  Surgeon: Edwinna Areola, DO;  Location: [facility];  Service: Obstetrics;  Laterality: N/A;   TONSILLECTOMY AND ADENOIDECTOMY N/A 06/02/2013   Procedure: TONSILLECTOMY AND ADENOIDECTOMY;  Surgeon: [name], MD;  Location: [facility];  Service: ENT;  Laterality: N/A;   TURBINATE REDUCTION Bilateral 06/02/2013   Procedure: TURBINATE REDUCTION BILATERAL;  Surgeon: [name], MD;  Location: [facility];  Service: ENT;  Laterality: Bilateral;    Family History  Problem Relation Age of Onset   Diabetes Mother    Depression Father    Diabetes Father    Hypertension Father    Heart disease Father        MI   Hyperlipidemia Father    Kidney disease Father    Vision loss Father    Bipolar disorder Father    Vision loss Maternal Grandmother    Stroke Maternal Grandmother    Diabetes Paternal Grandmother    Kidney failure Paternal Grandmother    Diabetes Paternal Grandfather    Kidney failure Paternal Grandfather    Muscular dystrophy Paternal Grandfather     Social History:  reports that she has never smoked. She has  been exposed to tobacco smoke. She has never used smokeless tobacco. She reports that she does not currently use drugs after having used the following drugs: Marijuana. Frequency: 4.00 times per week. She reports that she does not drink alcohol.  Allergies:  Allergies  Allergen Reactions   Adhesive [Tape] Rash   Latex Rash   Soap Rash    Medications: I have reviewed the patient's current medications.  Results for orders placed or performed during the hospital encounter of 12/29/22 (from the past 48 hour(s))  Lipase, blood     Status: None   Collection Time: 12/29/22  8:12 PM  Result Value Ref Range   Lipase 44 11 - 51 U/L    Comment: Performed at [facility], [address], [place], [place] [address]  Comprehensive metabolic panel     Status: Abnormal   Collection Time: 12/29/22  8:12 PM  Result Value Ref Range   Sodium 137 135 - 145 mmol/L   Potassium 4.1 3.5 - 5.1 mmol/L   Chloride 100 98 - 111 mmol/L   CO2 26 22 - 32 mmol/L   Glucose, Bld 98 70 - 99 mg/dL    Comment: Glucose reference range applies only to samples taken after fasting for at least 8 hours.   BUN 13 6 - 20 mg/dL   Creatinine, Ser 6.04 0.44 - 1.00 mg/dL  Calcium 9.7 8.9 - 10.3 mg/dL   Total Protein 7.3 6.5 - 8.1 g/dL   Albumin 3.6 3.5 - 5.0 g/dL   AST 15 15 - 41 U/L   ALT 15 0 - 44 U/L   Alkaline Phosphatase 80 38 - 126 U/L   Total Bilirubin 0.1 (L) 0.3 - 1.2 mg/dL   GFR, Estimated >16 >10 mL/min    Comment: (NOTE) Calculated using the CKD-EPI Creatinine Equation (2021)    Anion gap 11 5 - 15    Comment: Performed at [facility], [address] [place], [place] [address]  CBC     Status: Abnormal   Collection Time: 12/29/22  8:12 PM  Result Value Ref Range   WBC 14.2 (H) 4.0 - 10.5 K/uL   RBC 4.81 3.87 - 5.11 MIL/uL   Hemoglobin 12.6 12.0 - 15.0 g/dL   HCT 40.9 81.1 - 91.4 %   MCV 80.9 80.0 - 100.0 fL   MCH 26.2 26.0 - 34.0 pg   MCHC 32.4 30.0 - 36.0 g/dL   RDW 78.2 95.6 - 21.3 %    Platelets 418 (H) 150 - 400 K/uL   nRBC 0.0 0.0 - 0.2 %    Comment: Performed at [facility], [address], [place], [place] [address]  Urinalysis, Routine w reflex microscopic -Urine, Clean Catch     Status: None   Collection Time: 12/29/22  8:32 PM  Result Value Ref Range   Color, Urine YELLOW YELLOW   APPearance CLEAR CLEAR   Specific Gravity, Urine 1.027 1.005 - 1.030   pH 5.0 5.0 - 8.0   Glucose, UA NEGATIVE NEGATIVE mg/dL   Hgb urine dipstick NEGATIVE NEGATIVE   Bilirubin Urine NEGATIVE NEGATIVE   Ketones, ur NEGATIVE NEGATIVE mg/dL   Protein, ur NEGATIVE NEGATIVE mg/dL   Nitrite NEGATIVE NEGATIVE   Leukocytes,Ua NEGATIVE NEGATIVE    Comment: Performed at [facility], [address], [place], [place] [address]  I-Stat beta hCG blood, ED     Status: Abnormal   Collection Time: 12/29/22 10:10 PM  Result Value Ref Range   I-stat hCG, quantitative 5.0 (H) <5 mIU/mL   Comment 3            Comment:   GEST. AGE      CONC.  (mIU/mL)   <=1 WEEK        5 - 50     2 WEEKS       50 - 500     3 WEEKS       100 - 10,000     4 WEEKS     1,000 - 30,000        FEMALE AND NON-PREGNANT FEMALE:     LESS THAN 5 mIU/mL     US Abdomen Limited RUQ (LIVER/GB)  Result Date: 12/30/2022 CLINICAL DATA:  27 year old female with history of postprandial right upper quadrant abdominal pain. EXAM: ULTRASOUND ABDOMEN LIMITED RIGHT UPPER QUADRANT COMPARISON:  Abdominal ultrasound 07/11/2022. FINDINGS: Gallbladder: Gallbladder appears nearly completely contracted. Within the gallbladder lumen there are echogenic foci with posterior acoustic shadowing measuring up to 2.4 cm in diameter, indicative of gallstones. There is also likely some amorphous echogenic nonshadowing material which likely reflects some sludge. Gallbladder wall thickness is slightly increased at 5 mm. No definite pericholecystic fluid. However, per report from the sonographer, there was a sonographic Murphy's sign on  examination. Common bile duct: Diameter: 4 mm. Liver: No focal lesion identified. Mild diffuse  increased hepatic echogenicity, indicative of a background of hepatic steatosis. Portal vein is patent on color Doppler imaging with normal direction of blood flow towards the liver. Other: None. IMPRESSION: 1. Cholelithiasis with imaging findings concerning for (but not definitive for) potential acute cholecystitis. Specifically, there is a sonographic Murphy's sign and some gallbladder wall thickening in the setting of indwelling stones and sludge. However, the gallbladder does not appear distended, there is no pericholecystic fluid and the wall thickening could be related to underdistention of the gallbladder. Surgical consultation is suggested. 2. Hepatic steatosis. Electronically Signed   By: Trudie Reed M.D.   On: 12/30/2022 05:12    ROS 10 point review of systems is negative except as listed above in HPI.   Physical Exam Blood pressure 102/62, pulse 78, temperature 98 F (36.7 C), temperature source Oral, resp. rate 20, height 5\' 4"  (1.626 m), weight 108 kg, last menstrual period 11/21/2022, SpO2 99 %, currently breastfeeding. Constitutional: well-developed, well-nourished HEENT: pupils equal, round, reactive to light, 2mm b/l, moist conjunctiva, external inspection of ears and nose normal, hearing intact Oropharynx: normal oropharyngeal mucosa, normal dentition Neck: no thyromegaly, trachea midline, no midline cervical tenderness to palpation Chest: breath sounds equal bilaterally, normal respiratory effort, no midline or lateral chest wall tenderness to palpation/deformity Abdomen: soft, RUQ TTP, no bruising, no hepatosplenomegaly GU: normal female genitalia  Back: no wounds, no thoracic/lumbar spine tenderness to palpation, no thoracic/lumbar spine stepoffs Rectal: deferred Extremities: 2+ radial and pedal pulses bilaterally, intact motor and sensation bilateral UE and LE, no peripheral  edema MSK: unable to assess gait/station, no clubbing/cyanosis of fingers/toes, normal ROM of all four extremities Skin: warm, dry, no rashes Psych: normal memory, normal mood/affect     Assessment/Plan: 35F with acute cholecystitis vs symptomatic cholelithiasis, favor acute cholecystitis. Discussed with patient the beta-hCG of 5 which could be indicative of a new IUP in the setting of no contraception use or could be 2/2 SAB in light of her recent abnormal bleeding. Unlikely to be residual from prior pregnancy. Discussed likelihood of SAB in the first trimester. Offered operative vs non-operative management and patient requests some time to think about her decision. Recommend trending beta by her OB regardless of decision regarding surgery.    Diamantina Monks, MD General and Trauma Surgery [facility]

## 2022-12-30 NOTE — Op Note (Signed)
Laparoscopic Cholecystectomy ICG cholangiography  Indications: This patient presents with early acute calculous cholecystitis and will undergo laparoscopic cholecystectomy.  Pre-operative Diagnosis: acute calculous cholecystitis  Post-operative Diagnosis: Same  Surgeon: Almond Lint   Assistants: n/a  Anesthesia: General endotracheal anesthesia and local  ASA Class: 3  Procedure Details   The patient was seen again in the Holding Room. The risks, benefits, complications, treatment options, and expected outcomes were discussed with the patient. The possibilities of  bleeding, recurrent infection, damage to nearby structures, the need for additional procedures, failure to diagnose a condition, the possible need to convert to an open procedure, and creating a complication requiring transfusion or operation were discussed with the patient. The likelihood of improving the patient's symptoms with return to their baseline status is good.    The patient and/or family concurred with the proposed plan, giving informed consent. The site of surgery properly noted. The patient was taken to OR # 9 and placed supine on the OR table.  SCDs were placed. Antibiotic prophylaxis was administered. General endotracheal anesthesia was then administered and tolerated well. After the induction, the abdomen was prepped with Chloraprep and draped in the sterile fashion. and the procedure verified as Laparoscopic Cholecystectomy with possible Intraoperative Cholangiogram. A Time Out was held and the above information confirmed.  Local anesthetic agent was injected into the skin near the umbilicus and a vertical supraumbilical incision was made with a #11 blade. I dissected down to the abdominal fascia with blunt dissection.  The fascia was incised vertically and the peritoneal cavity was entered bluntly.  A pursestring suture of 0-Vicryl was placed around the fascial opening.  The Hasson cannula was inserted and secured  with the stay suture.  Pneumoperitoneum was then created with CO2 and tolerated well without any adverse changes in the patient's vital signs. An 11-mm port was placed in the subxiphoid position.  Two 5-mm ports were placed in the right upper quadrant. All skin incisions were infiltrated with a local anesthetic agent before making the incision and placing the trocars.   The patient was placed into reverse Trendelenburg position and was tilted slightly to the patient's left.  The gallbladder was identified, and there were no adhesions noted.  Adhesions were lysed bluntly and with the electrocautery where indicated, taking care not to injure any adjacent organs or viscus. The gallbladder required aspiration as it was very tense.   The fundus of the gallbladder was then grasped and retracted cephalad.  The infundibulum was grasped and retracted laterally, exposing the peritoneum overlying the triangle of Calot. The cystic duct and cystic artery were identified.  These were both skeletonized.  A window between the gallbladder and the liver was obtained to ensure a critical view.    The ICG was visualized and the cystic duct was far up at the gallbladder compared to the common duct located in the porta.    The gallbladder was dissected from the liver bed in retrograde fashion with the electrocautery. The gallbladder was removed and placed in a retrieval bag.  The gallbladder and bag were then removed through the umbilical port site.  The liver bed was irrigated and inspected. Hemostasis was achieved with the electrocautery. Copious irrigation was utilized and was repeatedly aspirated until clear.    Pneumoperitoneum was released as we removed the trocars.   The pursestring suture was used to close the umbilical fascia.  One additional 0-0 vicryl was needed.  The scar from prior umbilical ring was excised.    4-0  Monocryl was used to close the skin in subcuticular fashion.   The skin was cleaned and dry, and  Dermabond was applied. The patient was then extubated and brought to the recovery room in stable condition. Instrument, sponge, and needle counts were correct at closure and at the conclusion of the case.   Findings: Acute cholecystitis .    Estimated Blood Loss: min         Drains: none          Specimens: Gallbladder to pathology       Complications: None; patient tolerated the procedure well.         Disposition: PACU - hemodynamically stable.         Condition: stable

## 2022-12-30 NOTE — Anesthesia Postprocedure Evaluation (Signed)
Anesthesia Post Note  Patient: MALYIAH FELLOWS  Procedure(s) Performed: LAPAROSCOPIC CHOLECYSTECTOMY INDOCYANINE GREEN FLUORESCENCE IMAGING (ICG)     Patient location during evaluation: PACU Anesthesia Type: General Level of consciousness: awake and alert Pain management: pain level controlled Vital Signs Assessment: post-procedure vital signs reviewed and stable Respiratory status: spontaneous breathing, nonlabored ventilation, respiratory function stable and patient connected to nasal cannula oxygen Cardiovascular status: blood pressure returned to baseline and stable Postop Assessment: no apparent nausea or vomiting Anesthetic complications: no  No notable events documented.  Last Vitals:  Vitals:   12/30/22 1330 12/30/22 1430  BP: (!) 101/55 (!) 101/52  Pulse: 76 68  Resp: 17 20  Temp: 36.5 C   SpO2: 94% 95%    Last Pain:  Vitals:   12/30/22 1430  TempSrc:   PainSc: 3                  Kennieth Rad

## 2022-12-30 NOTE — Anesthesia Procedure Notes (Signed)
Procedure Name: Intubation Date/Time: 12/30/2022 11:22 AM  Performed by: Aundria Rud, CRNAPre-anesthesia Checklist: Patient identified, Emergency Drugs available, Suction available and Patient being monitored Patient Re-evaluated:Patient Re-evaluated prior to induction Oxygen Delivery Method: Circle System Utilized Preoxygenation: Pre-oxygenation with 100% oxygen Induction Type: IV induction, Rapid sequence and Cricoid Pressure applied Laryngoscope Size: Mac and 3 Grade View: Grade I Tube type: Oral Tube size: 7.0 mm Number of attempts: 1 Airway Equipment and Method: Stylet and Oral airway Placement Confirmation: ETT inserted through vocal cords under direct vision, positive ETCO2 and breath sounds checked- equal and bilateral Secured at: 22 cm Tube secured with: Tape Dental Injury: Teeth and Oropharynx as per pre-operative assessment

## 2022-12-31 ENCOUNTER — Encounter (HOSPITAL_COMMUNITY): Payer: Self-pay | Admitting: General Surgery

## 2023-01-01 LAB — SURGICAL PATHOLOGY

## 2023-02-13 IMAGING — US US OB < 14 WEEKS - US OB TV
1 series · 15 of 28 positions shown · non-contrast
Comparison: None.

CLINICAL DATA: Abdominal cramping.

EXAM:
OBSTETRIC <14 WK US AND TRANSVAGINAL OB US
TECHNIQUE: Both transabdominal and transvaginal ultrasound examinations were
performed for complete evaluation of the gestation as well as the
maternal uterus, adnexal regions, and pelvic cul-de-sac.
Transvaginal technique was performed to assess early pregnancy.

[Series 1: us ob < 14 weeks - us ob tv · 52 acquisitions, 15 frames shown]
[im 1/52]
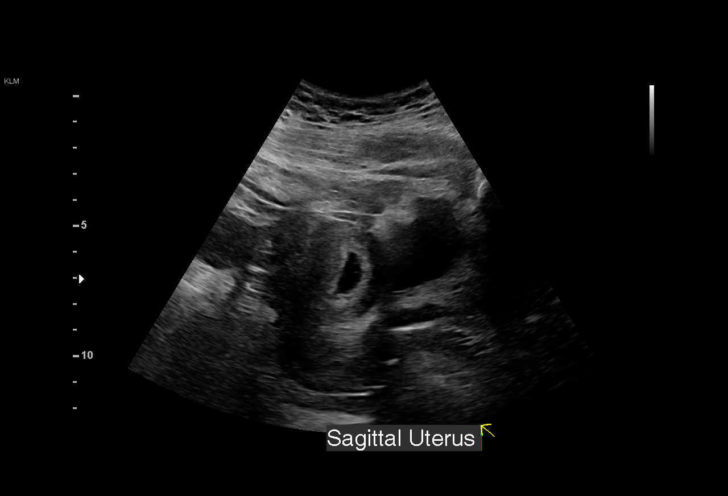
[im 4/52]
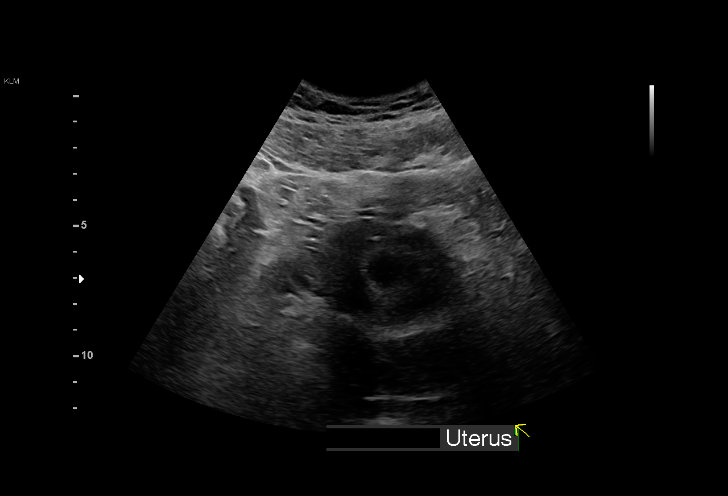
[im 8/52]
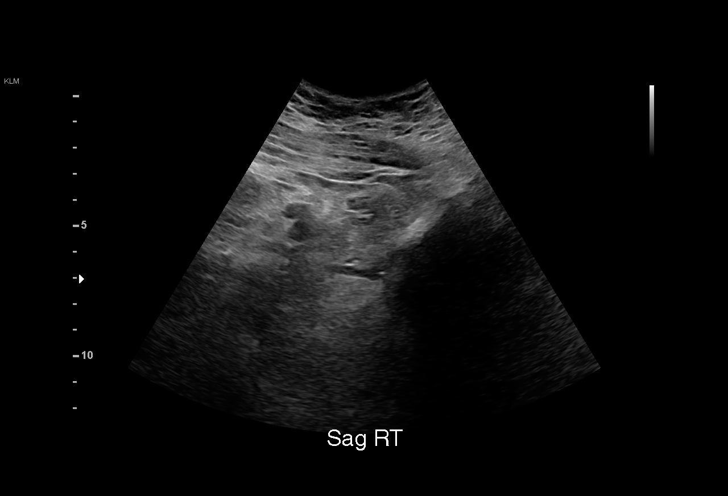
[im 12/52]
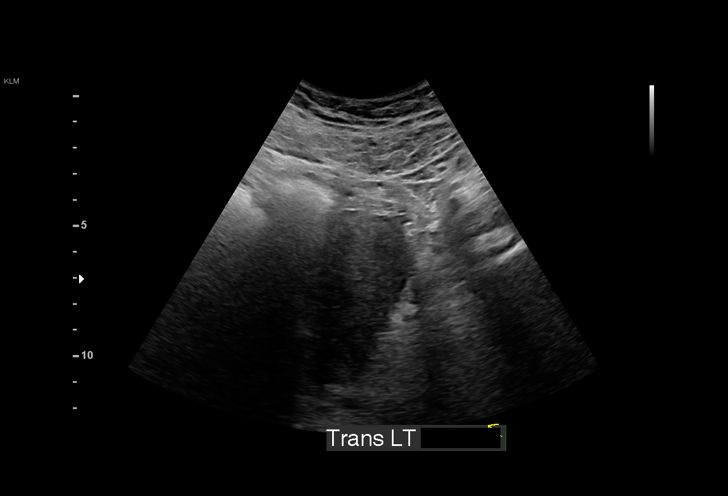
[im 16/52]
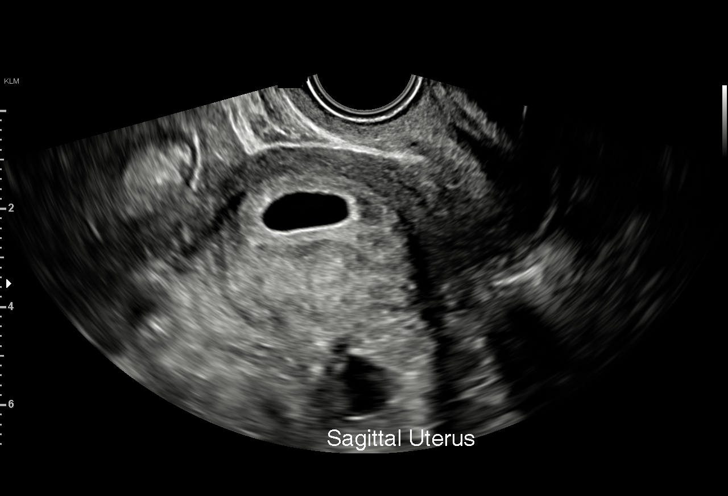
[im 19/52]
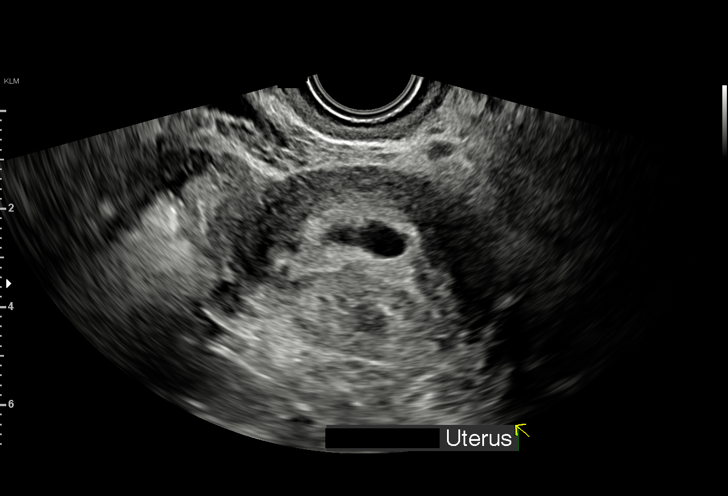
[im 23/52]
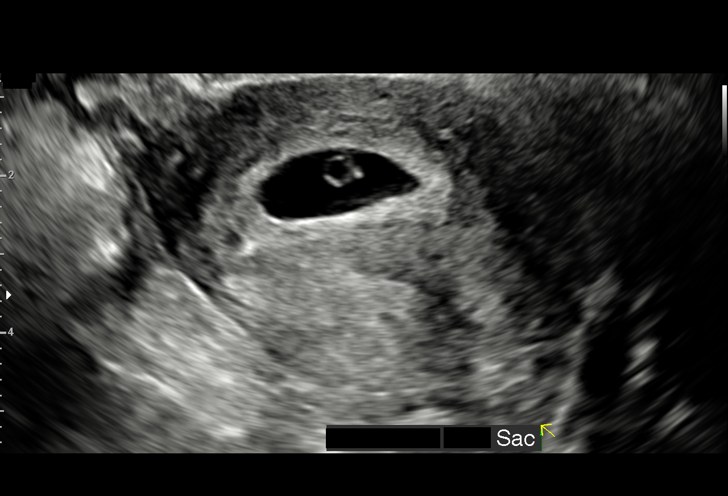
[im 27/52]
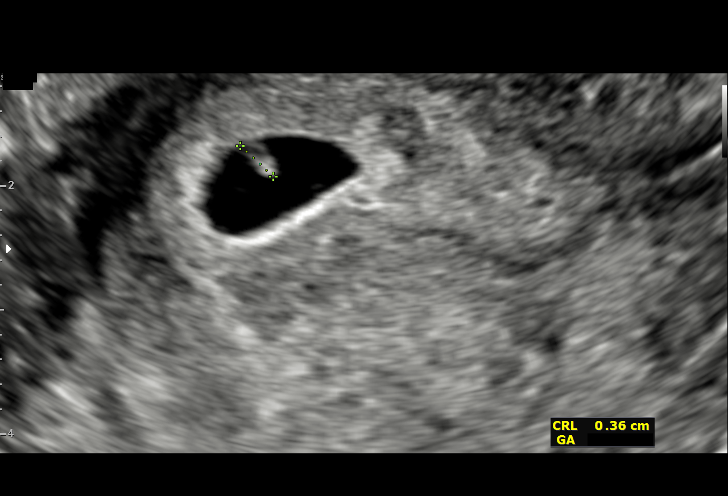
[im 29/52]
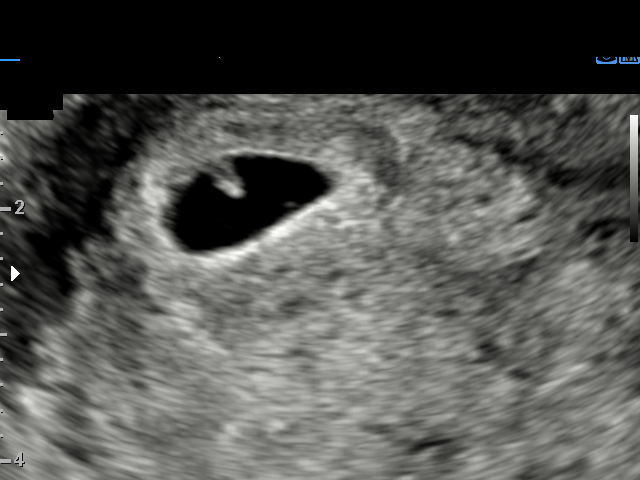
[im 33/52]
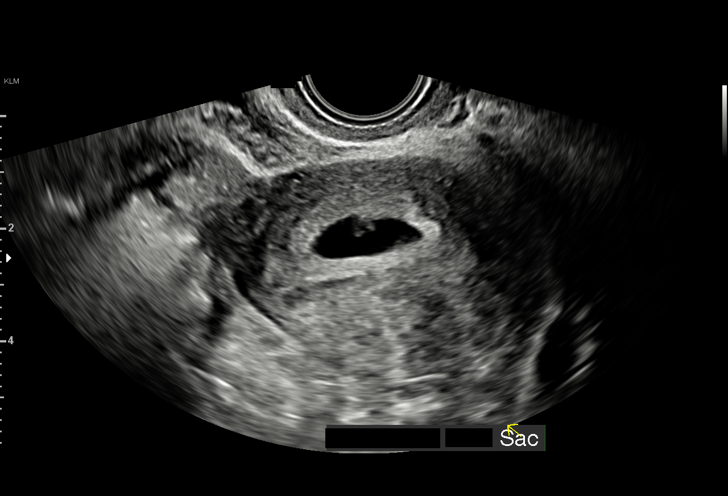
[im 36/52]
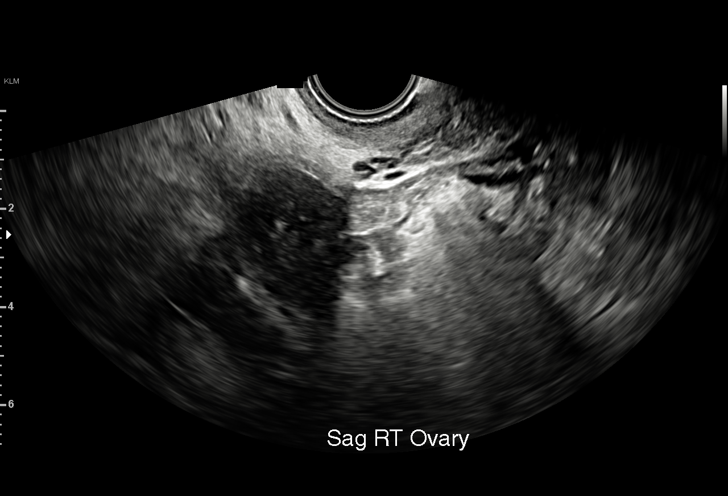
[im 40/52]
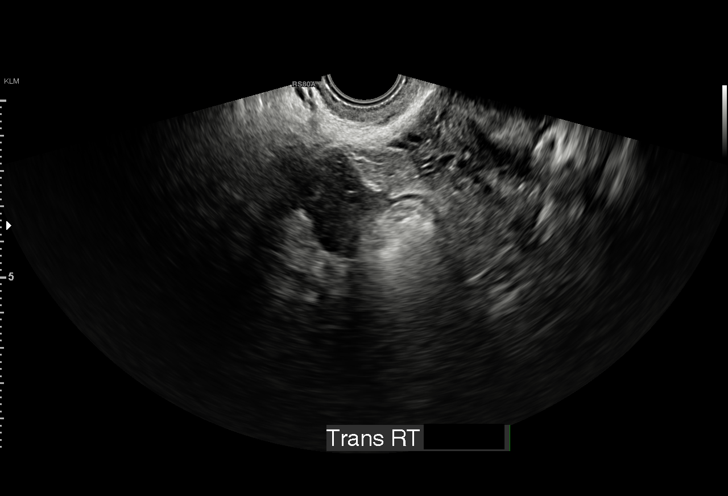
[im 44/52]
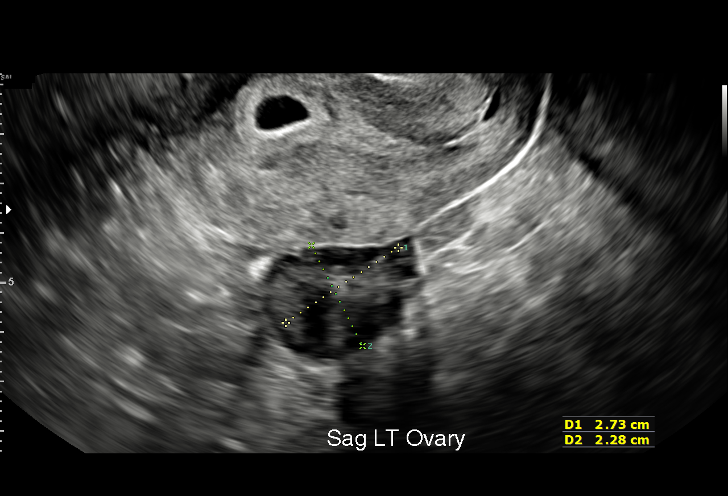
[im 48/52]
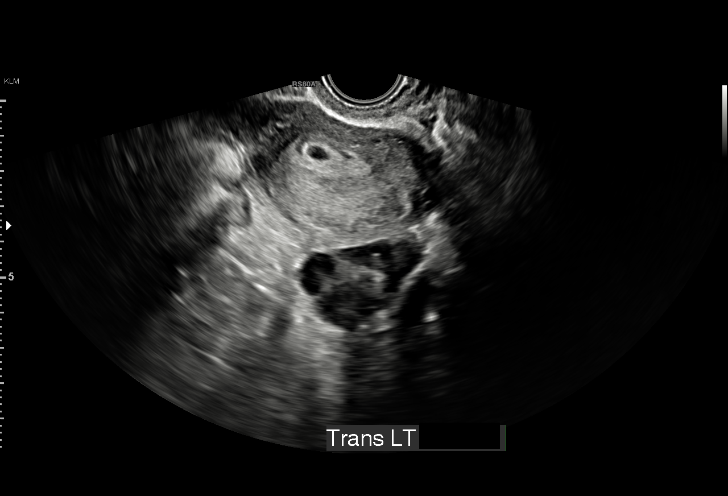
[im 52/52]
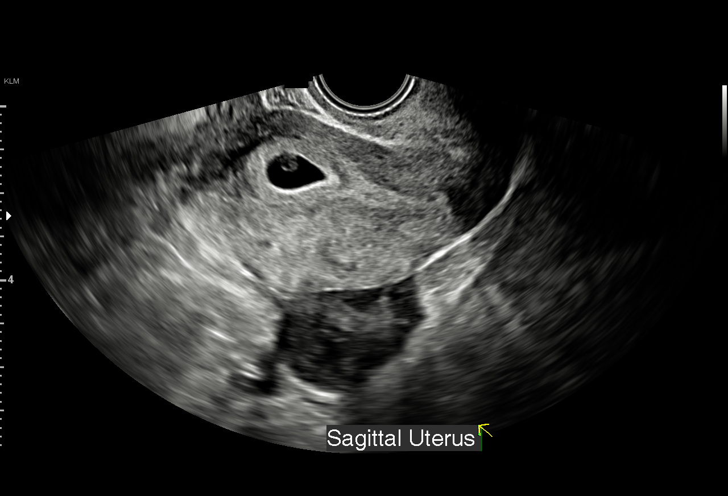

[15 of 28 positions shown; findings below may reference images not displayed]

FINDINGS: Intrauterine gestational sac: Single

Yolk sac:  Visualized.

Embryo:  Visualized.

Cardiac Activity: Visualized.

Heart Rate: 104 bpm

CRL:  3.3 mm   5 w   6 d                  US EDC: 08/22/2022

Subchorionic hemorrhage:  None visualized.

Maternal uterus/adnexae: The ovaries appear within normal limits. No
pelvic free fluid.
IMPRESSION: 1. Single live intrauterine gestation measuring 5 weeks 6 days by
crown-rump length.

## 2023-02-26 ENCOUNTER — Other Ambulatory Visit: Payer: 59

## 2023-02-26 NOTE — Progress Notes (Signed)
Patient called with vaginal bleeding.  Had HCG drawn on 4/21 prior to surgery and was then told she was pregnant.  Has not had any issues since but is now having some bleeding.  HCG ordered.  Will follow.

## 2023-03-01 LAB — BETA HCG QUANT (REF LAB): hCG Quant: 136 m[IU]/mL

## 2023-03-04 ENCOUNTER — Other Ambulatory Visit: Payer: Self-pay | Admitting: Adult Health

## 2023-03-04 DIAGNOSIS — Z3201 Encounter for pregnancy test, result positive: Secondary | ICD-10-CM

## 2023-03-05 ENCOUNTER — Encounter: Payer: Self-pay | Admitting: Obstetrics & Gynecology

## 2023-03-05 ENCOUNTER — Other Ambulatory Visit: Payer: Self-pay | Admitting: Adult Health

## 2023-03-05 DIAGNOSIS — Z3201 Encounter for pregnancy test, result positive: Secondary | ICD-10-CM

## 2023-03-05 LAB — BETA HCG QUANT (REF LAB): hCG Quant: 130 m[IU]/mL

## 2023-03-05 NOTE — Progress Notes (Signed)
Recheck QHCG in am 

## 2023-03-07 ENCOUNTER — Other Ambulatory Visit: Payer: Self-pay | Admitting: Adult Health

## 2023-03-07 DIAGNOSIS — Z3201 Encounter for pregnancy test, result positive: Secondary | ICD-10-CM

## 2023-03-07 LAB — BETA HCG QUANT (REF LAB): hCG Quant: 122 m[IU]/mL

## 2023-03-20 ENCOUNTER — Other Ambulatory Visit: Payer: Self-pay | Admitting: Adult Health

## 2023-03-20 DIAGNOSIS — O039 Complete or unspecified spontaneous abortion without complication: Secondary | ICD-10-CM

## 2023-03-20 LAB — BETA HCG QUANT (REF LAB): hCG Quant: 37 m[IU]/mL

## 2023-07-22 DIAGNOSIS — Z1389 Encounter for screening for other disorder: Secondary | ICD-10-CM | POA: Diagnosis not present

## 2023-07-22 DIAGNOSIS — Z113 Encounter for screening for infections with a predominantly sexual mode of transmission: Secondary | ICD-10-CM | POA: Diagnosis not present

## 2023-07-22 DIAGNOSIS — Z01419 Encounter for gynecological examination (general) (routine) without abnormal findings: Secondary | ICD-10-CM | POA: Diagnosis not present

## 2023-07-22 DIAGNOSIS — Z3201 Encounter for pregnancy test, result positive: Secondary | ICD-10-CM | POA: Diagnosis not present

## 2023-07-22 DIAGNOSIS — Z8632 Personal history of gestational diabetes: Secondary | ICD-10-CM | POA: Diagnosis not present

## 2023-07-22 DIAGNOSIS — Z130 Encounter for screening for diseases of the blood and blood-forming organs and certain disorders involving the immune mechanism: Secondary | ICD-10-CM | POA: Diagnosis not present

## 2023-07-22 DIAGNOSIS — N912 Amenorrhea, unspecified: Secondary | ICD-10-CM | POA: Diagnosis not present

## 2023-08-01 DIAGNOSIS — O3680X1 Pregnancy with inconclusive fetal viability, fetus 1: Secondary | ICD-10-CM | POA: Diagnosis not present

## 2023-08-01 DIAGNOSIS — Z3A14 14 weeks gestation of pregnancy: Secondary | ICD-10-CM | POA: Diagnosis not present

## 2023-08-05 LAB — OB RESULTS CONSOLE RPR: RPR: NONREACTIVE

## 2023-08-05 LAB — OB RESULTS CONSOLE RUBELLA ANTIBODY, IGM: Rubella: IMMUNE

## 2023-08-05 LAB — OB RESULTS CONSOLE HIV ANTIBODY (ROUTINE TESTING): HIV: NONREACTIVE

## 2023-08-05 LAB — OB RESULTS CONSOLE HEPATITIS B SURFACE ANTIGEN: Hepatitis B Surface Ag: NEGATIVE

## 2023-08-05 LAB — HEPATITIS C ANTIBODY: HCV Ab: NEGATIVE

## 2023-08-05 LAB — OB RESULTS CONSOLE GC/CHLAMYDIA
Chlamydia: NEGATIVE
Neisseria Gonorrhea: NEGATIVE

## 2023-08-05 LAB — OB RESULTS CONSOLE ANTIBODY SCREEN: Antibody Screen: NEGATIVE

## 2023-09-06 DIAGNOSIS — Z363 Encounter for antenatal screening for malformations: Secondary | ICD-10-CM | POA: Diagnosis not present

## 2023-09-06 DIAGNOSIS — Z3A18 18 weeks gestation of pregnancy: Secondary | ICD-10-CM | POA: Diagnosis not present

## 2023-09-06 DIAGNOSIS — Z8759 Personal history of other complications of pregnancy, childbirth and the puerperium: Secondary | ICD-10-CM | POA: Diagnosis not present

## 2023-09-12 DIAGNOSIS — Z362 Encounter for other antenatal screening follow-up: Secondary | ICD-10-CM | POA: Diagnosis not present

## 2023-09-12 DIAGNOSIS — Z3A21 21 weeks gestation of pregnancy: Secondary | ICD-10-CM | POA: Diagnosis not present

## 2023-09-19 DIAGNOSIS — O368390 Maternal care for abnormalities of the fetal heart rate or rhythm, unspecified trimester, not applicable or unspecified: Secondary | ICD-10-CM | POA: Diagnosis not present

## 2023-10-28 DIAGNOSIS — Z3482 Encounter for supervision of other normal pregnancy, second trimester: Secondary | ICD-10-CM | POA: Diagnosis not present

## 2023-10-28 DIAGNOSIS — Z3A27 27 weeks gestation of pregnancy: Secondary | ICD-10-CM | POA: Diagnosis not present

## 2023-10-28 DIAGNOSIS — Z3689 Encounter for other specified antenatal screening: Secondary | ICD-10-CM | POA: Diagnosis not present

## 2023-10-28 DIAGNOSIS — Z23 Encounter for immunization: Secondary | ICD-10-CM | POA: Diagnosis not present

## 2023-10-28 DIAGNOSIS — O34211 Maternal care for low transverse scar from previous cesarean delivery: Secondary | ICD-10-CM | POA: Diagnosis not present

## 2023-11-01 DIAGNOSIS — R7309 Other abnormal glucose: Secondary | ICD-10-CM | POA: Diagnosis not present

## 2023-12-05 DIAGNOSIS — Z3A33 33 weeks gestation of pregnancy: Secondary | ICD-10-CM | POA: Diagnosis not present

## 2023-12-05 DIAGNOSIS — O24419 Gestational diabetes mellitus in pregnancy, unspecified control: Secondary | ICD-10-CM | POA: Diagnosis not present

## 2023-12-05 DIAGNOSIS — O09293 Supervision of pregnancy with other poor reproductive or obstetric history, third trimester: Secondary | ICD-10-CM | POA: Diagnosis not present

## 2023-12-05 DIAGNOSIS — O34211 Maternal care for low transverse scar from previous cesarean delivery: Secondary | ICD-10-CM | POA: Diagnosis not present

## 2023-12-12 DIAGNOSIS — O24414 Gestational diabetes mellitus in pregnancy, insulin controlled: Secondary | ICD-10-CM | POA: Diagnosis not present

## 2023-12-12 DIAGNOSIS — Z9889 Other specified postprocedural states: Secondary | ICD-10-CM | POA: Diagnosis not present

## 2023-12-12 DIAGNOSIS — Z3A34 34 weeks gestation of pregnancy: Secondary | ICD-10-CM | POA: Diagnosis not present

## 2023-12-18 DIAGNOSIS — O34211 Maternal care for low transverse scar from previous cesarean delivery: Secondary | ICD-10-CM | POA: Diagnosis not present

## 2023-12-18 DIAGNOSIS — O09293 Supervision of pregnancy with other poor reproductive or obstetric history, third trimester: Secondary | ICD-10-CM | POA: Diagnosis not present

## 2023-12-18 DIAGNOSIS — Z3A34 34 weeks gestation of pregnancy: Secondary | ICD-10-CM | POA: Diagnosis not present

## 2023-12-18 DIAGNOSIS — O24419 Gestational diabetes mellitus in pregnancy, unspecified control: Secondary | ICD-10-CM | POA: Diagnosis not present

## 2023-12-25 DIAGNOSIS — O24414 Gestational diabetes mellitus in pregnancy, insulin controlled: Secondary | ICD-10-CM | POA: Diagnosis not present

## 2023-12-25 DIAGNOSIS — O24419 Gestational diabetes mellitus in pregnancy, unspecified control: Secondary | ICD-10-CM | POA: Diagnosis not present

## 2023-12-25 DIAGNOSIS — Z3A35 35 weeks gestation of pregnancy: Secondary | ICD-10-CM | POA: Diagnosis not present

## 2023-12-25 LAB — OB RESULTS CONSOLE GBS: GBS: POSITIVE

## 2024-01-01 DIAGNOSIS — Z3A36 36 weeks gestation of pregnancy: Secondary | ICD-10-CM | POA: Diagnosis not present

## 2024-01-01 DIAGNOSIS — F5089 Other specified eating disorder: Secondary | ICD-10-CM | POA: Diagnosis not present

## 2024-01-01 DIAGNOSIS — O24414 Gestational diabetes mellitus in pregnancy, insulin controlled: Secondary | ICD-10-CM | POA: Diagnosis not present

## 2024-01-02 ENCOUNTER — Encounter (HOSPITAL_COMMUNITY): Payer: Self-pay | Admitting: *Deleted

## 2024-01-02 ENCOUNTER — Telehealth (HOSPITAL_COMMUNITY): Payer: Self-pay | Admitting: *Deleted

## 2024-01-02 NOTE — Telephone Encounter (Signed)
 Preadmission screen

## 2024-01-08 DIAGNOSIS — O24414 Gestational diabetes mellitus in pregnancy, insulin controlled: Secondary | ICD-10-CM | POA: Diagnosis not present

## 2024-01-08 DIAGNOSIS — Z3483 Encounter for supervision of other normal pregnancy, third trimester: Secondary | ICD-10-CM | POA: Diagnosis not present

## 2024-01-08 DIAGNOSIS — Z3A37 37 weeks gestation of pregnancy: Secondary | ICD-10-CM | POA: Diagnosis not present

## 2024-01-15 DIAGNOSIS — O0993 Supervision of high risk pregnancy, unspecified, third trimester: Secondary | ICD-10-CM | POA: Diagnosis not present

## 2024-01-15 DIAGNOSIS — Z3483 Encounter for supervision of other normal pregnancy, third trimester: Secondary | ICD-10-CM | POA: Diagnosis not present

## 2024-01-15 DIAGNOSIS — Z3A38 38 weeks gestation of pregnancy: Secondary | ICD-10-CM | POA: Diagnosis not present

## 2024-01-15 DIAGNOSIS — O24419 Gestational diabetes mellitus in pregnancy, unspecified control: Secondary | ICD-10-CM | POA: Diagnosis not present

## 2024-01-18 ENCOUNTER — Other Ambulatory Visit: Payer: Self-pay

## 2024-01-18 ENCOUNTER — Inpatient Hospital Stay (HOSPITAL_COMMUNITY)
Admission: RE | Admit: 2024-01-18 | Discharge: 2024-01-21 | DRG: 788 | Disposition: A | Discharge status: Home / Self Care | Attending: Obstetrics and Gynecology | Admitting: Obstetrics and Gynecology

## 2024-01-18 ENCOUNTER — Encounter (HOSPITAL_COMMUNITY): Payer: Self-pay | Admitting: Obstetrics and Gynecology

## 2024-01-18 ENCOUNTER — Inpatient Hospital Stay (HOSPITAL_COMMUNITY): Admitting: Anesthesiology

## 2024-01-18 ENCOUNTER — Encounter (HOSPITAL_COMMUNITY)
Admission: RE | Disposition: A | Discharge status: Home / Self Care | Payer: Self-pay | Source: Home / Self Care | Attending: Obstetrics and Gynecology

## 2024-01-18 ENCOUNTER — Inpatient Hospital Stay (HOSPITAL_COMMUNITY)

## 2024-01-18 DIAGNOSIS — Z8249 Family history of ischemic heart disease and other diseases of the circulatory system: Secondary | ICD-10-CM | POA: Diagnosis not present

## 2024-01-18 DIAGNOSIS — O24424 Gestational diabetes mellitus in childbirth, insulin controlled: Secondary | ICD-10-CM | POA: Diagnosis not present

## 2024-01-18 DIAGNOSIS — O99824 Streptococcus B carrier state complicating childbirth: Secondary | ICD-10-CM | POA: Diagnosis not present

## 2024-01-18 DIAGNOSIS — Z98891 History of uterine scar from previous surgery: Secondary | ICD-10-CM | POA: Diagnosis not present

## 2024-01-18 DIAGNOSIS — O24429 Gestational diabetes mellitus in childbirth, unspecified control: Secondary | ICD-10-CM | POA: Diagnosis not present

## 2024-01-18 DIAGNOSIS — Z833 Family history of diabetes mellitus: Secondary | ICD-10-CM

## 2024-01-18 DIAGNOSIS — O34211 Maternal care for low transverse scar from previous cesarean delivery: Secondary | ICD-10-CM | POA: Diagnosis not present

## 2024-01-18 DIAGNOSIS — Z3A39 39 weeks gestation of pregnancy: Secondary | ICD-10-CM | POA: Diagnosis not present

## 2024-01-18 DIAGNOSIS — O99820 Streptococcus B carrier state complicating pregnancy: Secondary | ICD-10-CM | POA: Diagnosis not present

## 2024-01-18 DIAGNOSIS — O34219 Maternal care for unspecified type scar from previous cesarean delivery: Secondary | ICD-10-CM | POA: Diagnosis not present

## 2024-01-18 DIAGNOSIS — O368390 Maternal care for abnormalities of the fetal heart rate or rhythm, unspecified trimester, not applicable or unspecified: Secondary | ICD-10-CM | POA: Diagnosis not present

## 2024-01-18 DIAGNOSIS — Z3A00 Weeks of gestation of pregnancy not specified: Secondary | ICD-10-CM | POA: Diagnosis not present

## 2024-01-18 DIAGNOSIS — O99214 Obesity complicating childbirth: Secondary | ICD-10-CM | POA: Diagnosis present

## 2024-01-18 LAB — CBC
HCT: 30.8 % — ABNORMAL LOW (ref 36.0–46.0)
Hemoglobin: 10.8 g/dL — ABNORMAL LOW (ref 12.0–15.0)
MCH: 28.6 pg (ref 26.0–34.0)
MCHC: 35.1 g/dL (ref 30.0–36.0)
MCV: 81.5 fL (ref 80.0–100.0)
Platelets: 179 10*3/uL (ref 150–400)
RBC: 3.78 MIL/uL — ABNORMAL LOW (ref 3.87–5.11)
RDW: 13.2 % (ref 11.5–15.5)
WBC: 9.9 10*3/uL (ref 4.0–10.5)
nRBC: 0 % (ref 0.0–0.2)

## 2024-01-18 LAB — COMPREHENSIVE METABOLIC PANEL WITH GFR
ALT: 15 U/L (ref 0–44)
AST: 20 U/L (ref 15–41)
Albumin: 2.5 g/dL — ABNORMAL LOW (ref 3.5–5.0)
Alkaline Phosphatase: 103 U/L (ref 38–126)
Anion gap: 10 (ref 5–15)
BUN: 9 mg/dL (ref 6–20)
CO2: 18 mmol/L — ABNORMAL LOW (ref 22–32)
Calcium: 9 mg/dL (ref 8.9–10.3)
Chloride: 108 mmol/L (ref 98–111)
Creatinine, Ser: 0.54 mg/dL (ref 0.44–1.00)
GFR, Estimated: >60 mL/min (ref >60)
Glucose, Bld: 77 mg/dL (ref 70–99)
Potassium: 3.7 mmol/L (ref 3.5–5.1)
Sodium: 136 mmol/L (ref 135–145)
Total Bilirubin: 0.5 mg/dL (ref 0.0–1.2)
Total Protein: 6.5 g/dL (ref 6.5–8.1)

## 2024-01-18 LAB — RPR: RPR Ser Ql: NONREACTIVE

## 2024-01-18 LAB — GLUCOSE, CAPILLARY
Glucose-Capillary: 81 mg/dL (ref 70–99)
Glucose-Capillary: 79 mg/dL (ref 70–99)
Glucose-Capillary: 80 mg/dL (ref 70–99)

## 2024-01-18 LAB — PROTEIN / CREATININE RATIO, URINE
Creatinine, Urine: 123 mg/dL
Protein Creatinine Ratio: 0.2 mg/mg{creat} — ABNORMAL HIGH (ref 0.00–0.15)
Total Protein, Urine: 25 mg/dL

## 2024-01-18 LAB — TYPE AND SCREEN
ABO/RH(D): B POS
Antibody Screen: NEGATIVE

## 2024-01-18 SURGERY — Surgical Case
Anesthesia: General

## 2024-01-18 MED ORDER — PROPOFOL 10 MG/ML IV BOLUS
INTRAVENOUS | Status: AC
  Filled 2024-01-18: qty 20

## 2024-01-18 MED ORDER — SODIUM CHLORIDE 0.9 % IR SOLN
Status: DC | PRN
  Administered 2024-01-18: 1

## 2024-01-18 MED ORDER — MEPERIDINE HCL 25 MG/ML IJ SOLN: 6.2500 mg | INTRAMUSCULAR | Status: DC | PRN

## 2024-01-18 MED ORDER — HYDROMORPHONE HCL 1 MG/ML IJ SOLN
INTRAMUSCULAR | Status: DC | PRN
  Administered 2024-01-18 (×2): 1 mg via INTRAVENOUS

## 2024-01-18 MED ORDER — FERROUS SULFATE 325 (65 FE) MG PO TABS
325.0000 mg | ORAL_TABLET | ORAL | Status: DC
  Administered 2024-01-18 – 2024-01-20 (×2): 325 mg via ORAL
  Filled 2024-01-18 (×2): qty 1

## 2024-01-18 MED ORDER — KETOROLAC TROMETHAMINE 30 MG/ML IJ SOLN
INTRAMUSCULAR | Status: AC
  Filled 2024-01-18: qty 1

## 2024-01-18 MED ORDER — ONDANSETRON HCL 4 MG/2ML IJ SOLN: 4.0000 mg | Freq: Once | INTRAMUSCULAR | Status: DC | PRN

## 2024-01-18 MED ORDER — LACTATED RINGERS IV SOLN: 500.0000 mL | INTRAVENOUS | Status: DC | PRN

## 2024-01-18 MED ORDER — OXYTOCIN-SODIUM CHLORIDE 30-0.9 UT/500ML-% IV SOLN
2.5000 [IU]/h | INTRAVENOUS | Status: AC
  Administered 2024-01-18: 2.5 [IU]/h via INTRAVENOUS

## 2024-01-18 MED ORDER — ONDANSETRON HCL 4 MG/2ML IJ SOLN
INTRAMUSCULAR | Status: DC | PRN
  Administered 2024-01-18: 4 mg via INTRAVENOUS

## 2024-01-18 MED ORDER — PENICILLIN G POT IN DEXTROSE 60000 UNIT/ML IV SOLN
3.0000 10*6.[IU] | INTRAVENOUS | Status: DC
  Administered 2024-01-18: 3 10*6.[IU] via INTRAVENOUS
  Filled 2024-01-18: qty 50

## 2024-01-18 MED ORDER — MENTHOL 3 MG MT LOZG: 1.0000 | LOZENGE | OROMUCOSAL | Status: DC | PRN

## 2024-01-18 MED ORDER — LACTATED RINGERS IV SOLN: INTRAVENOUS | Status: AC

## 2024-01-18 MED ORDER — LIDOCAINE 2% (20 MG/ML) 5 ML SYRINGE
INTRAMUSCULAR | Status: AC
  Filled 2024-01-18: qty 5

## 2024-01-18 MED ORDER — SUGAMMADEX SODIUM 200 MG/2ML IV SOLN
INTRAVENOUS | Status: DC | PRN
  Administered 2024-01-18: 250 mg via INTRAVENOUS

## 2024-01-18 MED ORDER — SUCCINYLCHOLINE CHLORIDE 200 MG/10ML IV SOSY
PREFILLED_SYRINGE | INTRAVENOUS | Status: AC
  Filled 2024-01-18: qty 10

## 2024-01-18 MED ORDER — ONDANSETRON HCL 4 MG/2ML IJ SOLN: 4.0000 mg | Freq: Four times a day (QID) | INTRAMUSCULAR | Status: DC | PRN

## 2024-01-18 MED ORDER — TRANEXAMIC ACID-NACL 1000-0.7 MG/100ML-% IV SOLN
INTRAVENOUS | Status: DC | PRN
  Administered 2024-01-18: 1000 mg via INTRAVENOUS

## 2024-01-18 MED ORDER — SODIUM CHLORIDE 0.9 % IV SOLN
INTRAVENOUS | Status: DC | PRN
  Administered 2024-01-18: 500 mg via INTRAVENOUS

## 2024-01-18 MED ORDER — OXYCODONE HCL 5 MG/5ML PO SOLN: 5.0000 mg | Freq: Once | ORAL | Status: DC | PRN

## 2024-01-18 MED ORDER — KETOROLAC TROMETHAMINE 30 MG/ML IJ SOLN
30.0000 mg | Freq: Once | INTRAMUSCULAR | Status: AC
Start: 2024-01-18 — End: 2024-01-18
  Administered 2024-01-18: 30 mg via INTRAVENOUS

## 2024-01-18 MED ORDER — FENTANYL CITRATE (PF) 250 MCG/5ML IJ SOLN
INTRAMUSCULAR | Status: DC | PRN
  Administered 2024-01-18: 100 ug via INTRAVENOUS
  Administered 2024-01-18: 150 ug via INTRAVENOUS

## 2024-01-18 MED ORDER — ROCURONIUM BROMIDE 10 MG/ML (PF) SYRINGE
PREFILLED_SYRINGE | INTRAVENOUS | Status: AC
Start: 2024-01-18 — End: ?
  Filled 2024-01-18: qty 10

## 2024-01-18 MED ORDER — ACETAMINOPHEN 325 MG PO TABS: 650.0000 mg | ORAL_TABLET | ORAL | Status: DC | PRN

## 2024-01-18 MED ORDER — OXYTOCIN-SODIUM CHLORIDE 30-0.9 UT/500ML-% IV SOLN: 2.5000 [IU]/h | INTRAVENOUS | Status: DC

## 2024-01-18 MED ORDER — SODIUM CHLORIDE 0.9 % IV SOLN
INTRAVENOUS | Status: AC
  Filled 2024-01-18: qty 5

## 2024-01-18 MED ORDER — ONDANSETRON HCL 4 MG/2ML IJ SOLN
INTRAMUSCULAR | Status: AC
  Filled 2024-01-18: qty 2

## 2024-01-18 MED ORDER — CEFAZOLIN SODIUM-DEXTROSE 3-4 GM/150ML-% IV SOLN
INTRAVENOUS | Status: AC
  Filled 2024-01-18: qty 150

## 2024-01-18 MED ORDER — ROCURONIUM BROMIDE 10 MG/ML (PF) SYRINGE
PREFILLED_SYRINGE | INTRAVENOUS | Status: AC
  Filled 2024-01-18: qty 10

## 2024-01-18 MED ORDER — SIMETHICONE 80 MG PO CHEW: 80.0000 mg | CHEWABLE_TABLET | ORAL | Status: DC | PRN

## 2024-01-18 MED ORDER — OXYCODONE HCL 5 MG PO TABS
5.0000 mg | ORAL_TABLET | ORAL | Status: DC | PRN
  Administered 2024-01-19 (×2): 10 mg via ORAL
  Administered 2024-01-19: 5 mg via ORAL
  Administered 2024-01-20: 10 mg via ORAL
  Administered 2024-01-20: 5 mg via ORAL
  Administered 2024-01-20 – 2024-01-21 (×2): 10 mg via ORAL
  Administered 2024-01-21: 5 mg via ORAL
  Filled 2024-01-18 (×2): qty 2
  Filled 2024-01-18 (×2): qty 1
  Filled 2024-01-18 (×2): qty 2
  Filled 2024-01-18 (×3): qty 1

## 2024-01-18 MED ORDER — DIPHENHYDRAMINE HCL 25 MG PO CAPS: 25.0000 mg | ORAL_CAPSULE | Freq: Four times a day (QID) | ORAL | Status: DC | PRN

## 2024-01-18 MED ORDER — SUCCINYLCHOLINE CHLORIDE 200 MG/10ML IV SOSY
PREFILLED_SYRINGE | INTRAVENOUS | Status: DC | PRN
  Administered 2024-01-18: 200 mg via INTRAVENOUS

## 2024-01-18 MED ORDER — HYDROXYZINE HCL 50 MG PO TABS: 50.0000 mg | ORAL_TABLET | Freq: Four times a day (QID) | ORAL | Status: DC | PRN

## 2024-01-18 MED ORDER — TRANEXAMIC ACID-NACL 1000-0.7 MG/100ML-% IV SOLN
INTRAVENOUS | Status: AC
  Filled 2024-01-18: qty 100

## 2024-01-18 MED ORDER — FENTANYL CITRATE (PF) 250 MCG/5ML IJ SOLN
INTRAMUSCULAR | Status: AC
  Filled 2024-01-18: qty 5

## 2024-01-18 MED ORDER — SIMETHICONE 80 MG PO CHEW
80.0000 mg | CHEWABLE_TABLET | Freq: Three times a day (TID) | ORAL | Status: DC
  Administered 2024-01-18 – 2024-01-21 (×10): 80 mg via ORAL
  Filled 2024-01-18 (×10): qty 1

## 2024-01-18 MED ORDER — FENTANYL CITRATE (PF) 100 MCG/2ML IJ SOLN: 25.0000 ug | INTRAMUSCULAR | Status: DC | PRN

## 2024-01-18 MED ORDER — LIDOCAINE HCL (PF) 1 % IJ SOLN: 30.0000 mL | INTRAMUSCULAR | Status: DC | PRN

## 2024-01-18 MED ORDER — STERILE WATER FOR IRRIGATION IR SOLN
Status: DC | PRN
  Administered 2024-01-18: 1

## 2024-01-18 MED ORDER — PROPOFOL 10 MG/ML IV BOLUS
INTRAVENOUS | Status: DC | PRN
  Administered 2024-01-18: 200 mg via INTRAVENOUS

## 2024-01-18 MED ORDER — LACTATED RINGERS IV SOLN: INTRAVENOUS | Status: DC

## 2024-01-18 MED ORDER — OXYCODONE-ACETAMINOPHEN 5-325 MG PO TABS: 2.0000 | ORAL_TABLET | ORAL | Status: DC | PRN

## 2024-01-18 MED ORDER — OXYCODONE-ACETAMINOPHEN 5-325 MG PO TABS: 1.0000 | ORAL_TABLET | ORAL | Status: DC | PRN

## 2024-01-18 MED ORDER — ROCURONIUM BROMIDE 100 MG/10ML IV SOLN
INTRAVENOUS | Status: DC | PRN
  Administered 2024-01-18: 20 mg via INTRAVENOUS

## 2024-01-18 MED ORDER — ACETAMINOPHEN 10 MG/ML IV SOLN
INTRAVENOUS | Status: AC
  Filled 2024-01-18: qty 100

## 2024-01-18 MED ORDER — DEXTROSE 5 % IV SOLN
INTRAVENOUS | Status: DC | PRN
  Administered 2024-01-18: 3 g via INTRAVENOUS

## 2024-01-18 MED ORDER — OXYTOCIN-SODIUM CHLORIDE 30-0.9 UT/500ML-% IV SOLN
INTRAVENOUS | Status: DC | PRN
  Administered 2024-01-18: 400 mL via INTRAVENOUS

## 2024-01-18 MED ORDER — WITCH HAZEL-GLYCERIN EX PADS: 1.0000 | MEDICATED_PAD | CUTANEOUS | Status: DC | PRN

## 2024-01-18 MED ORDER — DIBUCAINE (PERIANAL) 1 % EX OINT: 1.0000 | TOPICAL_OINTMENT | CUTANEOUS | Status: DC | PRN

## 2024-01-18 MED ORDER — SODIUM CHLORIDE 0.9 % IV SOLN
5.0000 10*6.[IU] | Freq: Once | INTRAVENOUS | Status: AC
  Administered 2024-01-18: 5 10*6.[IU] via INTRAVENOUS
  Filled 2024-01-18: qty 5

## 2024-01-18 MED ORDER — SOD CITRATE-CITRIC ACID 500-334 MG/5ML PO SOLN
30.0000 mL | ORAL | Status: DC | PRN
  Administered 2024-01-18: 30 mL via ORAL
  Filled 2024-01-18: qty 30

## 2024-01-18 MED ORDER — OXYTOCIN BOLUS FROM INFUSION: 333.0000 mL | Freq: Once | INTRAVENOUS | Status: DC

## 2024-01-18 MED ORDER — GABAPENTIN 100 MG PO CAPS
100.0000 mg | ORAL_CAPSULE | Freq: Three times a day (TID) | ORAL | Status: DC
  Administered 2024-01-18 – 2024-01-21 (×9): 100 mg via ORAL
  Filled 2024-01-18 (×9): qty 1

## 2024-01-18 MED ORDER — PRENATAL MULTIVITAMIN CH
1.0000 | ORAL_TABLET | Freq: Every day | ORAL | Status: DC
  Administered 2024-01-19 – 2024-01-21 (×3): 1 via ORAL
  Filled 2024-01-18 (×3): qty 1

## 2024-01-18 MED ORDER — ACETAMINOPHEN 500 MG PO TABS
1000.0000 mg | ORAL_TABLET | Freq: Four times a day (QID) | ORAL | Status: DC
  Administered 2024-01-18 – 2024-01-21 (×11): 1000 mg via ORAL
  Filled 2024-01-18 (×11): qty 2

## 2024-01-18 MED ORDER — ZOLPIDEM TARTRATE 5 MG PO TABS: 5.0000 mg | ORAL_TABLET | Freq: Every evening | ORAL | Status: DC | PRN

## 2024-01-18 MED ORDER — OXYCODONE HCL 5 MG PO TABS: 5.0000 mg | ORAL_TABLET | Freq: Once | ORAL | Status: DC | PRN

## 2024-01-18 MED ORDER — OXYTOCIN-SODIUM CHLORIDE 30-0.9 UT/500ML-% IV SOLN
1.0000 m[IU]/min | INTRAVENOUS | Status: DC
  Administered 2024-01-18: 2 m[IU]/min via INTRAVENOUS
  Filled 2024-01-18: qty 500

## 2024-01-18 MED ORDER — SENNOSIDES-DOCUSATE SODIUM 8.6-50 MG PO TABS
2.0000 | ORAL_TABLET | Freq: Every day | ORAL | Status: DC
  Administered 2024-01-19 – 2024-01-21 (×3): 2 via ORAL
  Filled 2024-01-18 (×3): qty 2

## 2024-01-18 MED ORDER — HYDROMORPHONE HCL 1 MG/ML IJ SOLN
INTRAMUSCULAR | Status: AC
  Filled 2024-01-18: qty 2

## 2024-01-18 MED ORDER — ACETAMINOPHEN 10 MG/ML IV SOLN
INTRAVENOUS | Status: DC | PRN
  Administered 2024-01-18: 1000 mg via INTRAVENOUS

## 2024-01-18 MED ORDER — TERBUTALINE SULFATE 1 MG/ML IJ SOLN
0.2500 mg | Freq: Once | INTRAMUSCULAR | Status: AC | PRN
  Administered 2024-01-18: 0.25 mg via SUBCUTANEOUS
  Filled 2024-01-18: qty 1

## 2024-01-18 MED ORDER — LIDOCAINE HCL (CARDIAC) PF 100 MG/5ML IV SOSY
PREFILLED_SYRINGE | INTRAVENOUS | Status: DC | PRN
  Administered 2024-01-18: 100 mg via INTRAVENOUS

## 2024-01-18 MED ORDER — FENTANYL CITRATE (PF) 100 MCG/2ML IJ SOLN: 50.0000 ug | INTRAMUSCULAR | Status: DC | PRN

## 2024-01-18 MED ORDER — COCONUT OIL OIL: 1.0000 | TOPICAL_OIL | Status: DC | PRN

## 2024-01-18 SURGICAL SUPPLY — 32 items
BENZOIN TINCTURE PRP APPL 2/3 (GAUZE/BANDAGES/DRESSINGS) IMPLANT
CHLORAPREP W/TINT 26 (MISCELLANEOUS) ×2 IMPLANT
CLAMP UMBILICAL CORD (MISCELLANEOUS) ×1 IMPLANT
CLOTH BEACON ORANGE TIMEOUT ST (SAFETY) ×1 IMPLANT
DRAPE C SECTION CLR SCREEN (DRAPES) ×1 IMPLANT
DRSG OPSITE POSTOP 4X10 (GAUZE/BANDAGES/DRESSINGS) ×1 IMPLANT
ELECTRODE REM PT RTRN 9FT ADLT (ELECTROSURGICAL) ×1 IMPLANT
EXTRACTOR VACUUM KIWI (MISCELLANEOUS) IMPLANT
GLOVE BIO SURGEON STRL SZ 6.5 (GLOVE) ×1 IMPLANT
GLOVE BIOGEL PI IND STRL 7.0 (GLOVE) ×2 IMPLANT
GOWN STRL REUS W/TWL LRG LVL3 (GOWN DISPOSABLE) ×2 IMPLANT
KIT ABG SYR 3ML LUER SLIP (SYRINGE) IMPLANT
MAT PREVALON FULL STRYKER (MISCELLANEOUS) IMPLANT
NDL HYPO 25X5/8 SAFETYGLIDE (NEEDLE) IMPLANT
NEEDLE HYPO 25X5/8 SAFETYGLIDE (NEEDLE) IMPLANT
NS IRRIG 1000ML POUR BTL (IV SOLUTION) ×1 IMPLANT
PACK C SECTION WH (CUSTOM PROCEDURE TRAY) ×1 IMPLANT
PAD OB MATERNITY 4.3X12.25 (PERSONAL CARE ITEMS) ×1 IMPLANT
RETRACTOR WND ALEXIS 25 LRG (MISCELLANEOUS) ×1 IMPLANT
RETRACTOR WOUND ALXS 25CM LRG (MISCELLANEOUS) ×1 IMPLANT
RTRCTR C-SECT PINK 25CM LRG (MISCELLANEOUS) IMPLANT
STRIP CLOSURE SKIN 1/2X4 (GAUZE/BANDAGES/DRESSINGS) IMPLANT
SUT CHROMIC 1 CTX 36 (SUTURE) ×2 IMPLANT
SUT PLAIN 0 NONE (SUTURE) IMPLANT
SUT PLAIN 2 0 XLH (SUTURE) ×1 IMPLANT
SUT VIC AB 0 CT1 27XBRD ANBCTR (SUTURE) ×2 IMPLANT
SUT VIC AB 2-0 CT1 TAPERPNT 27 (SUTURE) ×1 IMPLANT
SUT VIC AB 3-0 CT1 TAPERPNT 27 (SUTURE) IMPLANT
SUT VIC AB 4-0 KS 27 (SUTURE) ×1 IMPLANT
TOWEL OR 17X24 6PK STRL BLUE (TOWEL DISPOSABLE) ×1 IMPLANT
TRAY FOLEY W/BAG SLVR 14FR LF (SET/KITS/TRAYS/PACK) ×1 IMPLANT
WATER STERILE IRR 1000ML POUR (IV SOLUTION) ×1 IMPLANT

## 2024-01-18 NOTE — Anesthesia Procedure Notes (Signed)
 Procedure Name: Intubation Date/Time: 01/18/2024 3:14 PM  Performed by: Doak Free, CRNAPre-anesthesia Checklist: Patient identified, Emergency Drugs available, Suction available, Patient being monitored and Timeout performed Patient Re-evaluated:Patient Re-evaluated prior to induction Oxygen Delivery Method: Circle system utilized Preoxygenation: Pre-oxygenation with 100% oxygen Induction Type: Rapid sequence, IV induction and Cricoid Pressure applied Laryngoscope Size: Glidescope and 3 Grade View: Grade I Tube type: Oral Tube size: 7.0 mm Number of attempts: 1 Airway Equipment and Method: Stylet and Video-laryngoscopy Placement Confirmation: ETT inserted through vocal cords under direct vision, positive ETCO2 and breath sounds checked- equal and bilateral Secured at: 24 cm Tube secured with: Tape Dental Injury: Teeth and Oropharynx as per pre-operative assessment

## 2024-01-18 NOTE — Op Note (Signed)
 Operative Note    Preoperative Diagnosis IUP at 39 2/7wks  Cat 3 strip remote from delivery Previous cesarean section x 1 Gestational diabetes ( GDMA2) Maternal obesity BMI 47   Postoperative Diagnosis: Same  6. Nuchal cord x 1   Procedure: Stat repeat cesarean section with double layered closure and no extensions.   Surgeon: Carollee Circle DO  Assist: Barbra Boone MD   Anesthesia: General   Fluids: LR EBL: UOP: Meds: Ancef , Azithromax, TXA  Findings: Viable female infant in vertex position, nuchal cord x 1. APGARS 9 and 9; Wt 8lbs 11oz                      Grossly normal uterus, bilateral fallopian tubes and ovaries.   Time in ROOM : 1509 Time of incision: 1514 Time of delivery: 1515  Specimen: Placenta to L/D   PreOP Indication: Pt with history of c/s x 1 now with cat 3 strip, remote from delivery and no epidural.    Procedure Note   Patient was taken to the operating room where she was quickly positioned on the table, splashed with betadine  and general anesthesia was administered.  A Pfannenstiel skin incision was then made through the previous incision with the scalpel and carried through to the underlying layer of fascia by sharp dissection.  The fascia was nicked in the midline and the incision extended bluntly. The superior and inferior aspects of the incision were also manually separated. Next the rectus muscles were separated and the underlying peritoneal cavity entered bluntly.   The Alexis self-retaining wound retractor was then placed and the lower uterine segment exposed. The lower uterine segment was then incised in a transverse fashion and the cavity itself entered bluntly.  The infant's head was lifted and delivered from the incision without difficulty. Vigorous spontaneous cry was noted during delivery.  The remainder of the infant delivered and the nose and mouth bulb suctioned. The cord was clamped and cut and infant handed off to the waiting  NICU team.  Cord pH and gas obtained.   The placenta was then spontaneously expressed from the uterus and the uterus cleared of all clots and debris with moist lap sponge. The uterine incision was then repaired in 2 layers the first layer was a running locked layer 1-0 chromic and the second an imbricating layer of the same suture.   The tubes and ovaries were inspected and the gutters irrigated and cleared of all clots and debris. The uterine incision was inspected and found to be hemostatic. All instruments and sponges were then removed from the abdomen. The peritoneum was then reapproximated in a running fashion with sutures of 2-0 Vicryl.  The fascia was then closed with 0 Vicryl in a running fashion. Subcutaneous tissue was reapproximated with 3-0 plain in a running fashion. The skin was closed with a subcuticular stitch of 4-0 Vicryl on a Keith needle and then reinforced with benzoin and steri-strips and a honeycomb dressing. At the conclusion of the procedure all instruments and sponge counts were correct.  Patient was awakened and taken to the recovery room in stable condition with her Baby was skin to skin with FOB

## 2024-01-18 NOTE — Anesthesia Preprocedure Evaluation (Signed)
 Anesthesia Evaluation  Patient identified by MRN, date of birth, ID band Patient awake    Reviewed: Allergy & Precautions, H&P , NPO status , Patient's Chart, lab work & pertinent test results  Airway Mallampati: III  TM Distance: >3 FB Neck ROM: Full    Dental no notable dental hx. (+) Teeth Intact, Dental Advisory Given   Pulmonary neg pulmonary ROS   Pulmonary exam normal breath sounds clear to auscultation       Cardiovascular Exercise Tolerance: Good hypertension, negative cardio ROS Normal cardiovascular exam Rhythm:Regular Rate:Normal     Neuro/Psych  Headaches PSYCHIATRIC DISORDERS Anxiety Depression    negative neurological ROS  negative psych ROS   GI/Hepatic negative GI ROS, Neg liver ROS,,,  Endo/Other  diabetes  Class 4 obesity  Renal/GU negative Renal ROS  negative genitourinary   Musculoskeletal negative musculoskeletal ROS (+)    Abdominal   Peds negative pediatric ROS (+)  Hematology negative hematology ROS (+)   Anesthesia Other Findings   Reproductive/Obstetrics negative OB ROS                             Anesthesia Physical Anesthesia Plan  ASA: 3 and emergent  Anesthesia Plan: General   Post-op Pain Management: Minimal or no pain anticipated   Induction: Intravenous  PONV Risk Score and Plan: 3 and Ondansetron , Dexamethasone  and Treatment may vary due to age or medical condition  Airway Management Planned: Oral ETT  Additional Equipment: None  Intra-op Plan:   Post-operative Plan: Extubation in OR  Informed Consent: I have reviewed the patients History and Physical, chart, labs and discussed the procedure including the risks, benefits and alternatives for the proposed anesthesia with the patient or authorized representative who has indicated his/her understanding and acceptance.       Plan Discussed with: Anesthesiologist and CRNA  Anesthesia Plan  Comments: (  )       Anesthesia Quick Evaluation

## 2024-01-18 NOTE — Transfer of Care (Signed)
 Immediate Anesthesia Transfer of Care Note  Patient: Susan Branch  Procedure(s) Performed: CESAREAN DELIVERY  Patient Location: PACU  Anesthesia Type:General  Level of Consciousness: awake, alert , and oriented  Airway & Oxygen Therapy: Patient Spontanous Breathing, nasal canula  Post-op Assessment: Report given to RN and Post -op Vital signs reviewed and stable  Post vital signs: Reviewed and stable  Last Vitals:  Vitals Value Taken Time  BP 137/101 01/18/24 1615  Temp    Pulse 101 01/18/24 1616  Resp 26 01/18/24 1616  SpO2 94 % 01/18/24 1616  Vitals shown include unfiled device data.  Last Pain:  Vitals:   01/18/24 1325  TempSrc: Oral  PainSc: 0-No pain         Complications: No notable events documented.

## 2024-01-18 NOTE — Progress Notes (Signed)
 Patient ID: Susan Branch, female   DOB: 12/04/1995, 28 y.o.   MRN: [id number] Late entry  Pt continued to have recurrent decelerations with pitocin  off.  On exam cervical change to 5.5 cm noted.  Was discussing concerns with recurrent decelerations with her when a prolonged deceleration into the 60s occurred. No resolution with position change   I advised pt immediately that a cesarean section would be the most advisable route at this time and she agreed.   Stat c/s section called  OR and staff notified.  Consent obtained verbally

## 2024-01-18 NOTE — Lactation Note (Signed)
 This note was copied from a baby's chart. Lactation Consultation Note  Patient Name: Susan Branch JOACZ'Y Date: 01/18/2024 Age:26 hours Reason for consult: Initial assessment;Maternal endocrine disorder;Term Mom stated baby has been BF well. Mom has easily flowing colostrum that swallows heard w/feedings. Mom had baby in cradle position, pulling on nipple. Room temperature very warm. Unless baby's temperature is low need to lower temperature. Had baby wrapped in blankets, skin warm. Dad lowered temperature. Un-wrapped baby and placed at the breast. Baby latched great.  Discussed w/mom about s/sx of hypoglycemia and when to alert RN. Newborn feeding habits, behavior, STS, I&O, positioning, support reviewed. Mom encouraged to feed baby 8-12 times/24 hours and with feeding cues.  Mom feels good about how BF Is going. Encouraged mom to call for assistance or questions as needed.   Maternal Data Has patient been taught Hand Expression?: Yes Does the patient have breastfeeding experience prior to this delivery?: Yes How long did the patient breastfeed?: 6 months  Feeding Mother's Current Feeding Choice: Breast Milk and Formula Nipple Type: Slow - flow  LATCH Score Latch: Grasps breast easily, tongue down, lips flanged, rhythmical sucking.  Audible Swallowing: Spontaneous and intermittent  Type of Nipple: Everted at rest and after stimulation  Comfort (Breast/Nipple): Soft / non-tender  Hold (Positioning): Assistance needed to correctly position infant at breast and maintain latch.  LATCH Score: 9   Lactation Tools Discussed/Used    Interventions Interventions: Breast feeding basics reviewed;Assisted with latch;Skin to skin;Breast massage;Hand express;Breast compression;Adjust position;Support pillows;Position options;Education;LC Services brochure  Discharge    Consult Status Consult Status: Follow-up Date: 01/19/24 Follow-up type: In-patient    Jadea Shiffer  G 01/18/2024, 9:56 PM

## 2024-01-18 NOTE — Progress Notes (Signed)
 Susan Branch is a 28 y.o. G2P1001 at [redacted]w[redacted]d  She is not appreciating ctxs yet. +Fms. No complaints   Objective: BP 105/79   Pulse 78   Temp 98.1 F (36.7 C) (Oral)   Resp 20   Ht 5\' 3"  (1.6 m)   Wt 120.5 kg   BMI 47.05 kg/m  No intake/output data recorded. No intake/output data recorded.  FHT:  FHR: 140 bpm, variability: moderate,  accelerations:  Present,  decelerations:  Absent UC:   irregular, every 3-7 minutes SVE:   Dilation: 1 Effacement (%): 70 Exam by:: Nairi Oswald MD  Labs: Lab Results  Component Value Date   WBC 9.9 01/18/2024   HGB 10.8 (L) 01/18/2024   HCT 30.8 (L) 01/18/2024   MCV 81.5 01/18/2024   PLT 179 01/18/2024    Assessment / Plan: Induction of labor due to gestational diabetes,  progressing well on pitocin   Labor: Cervix unchanged, continue pitocin  1/61mus. Cooks foley placed with 60u/40v Preeclampsia:  no signs or symptoms of toxicity Fetal Wellbeing:  Category I Pain Control:  Labor support without medications I/D:  n/a Anticipated MOD:  TOLAC  Ranisha Allaire W Adetokunbo Mccadden, DO 01/18/2024, 11:38 AM

## 2024-01-18 NOTE — Progress Notes (Addendum)
 Patient ID: Susan Branch, female   DOB: 01/20/1996, 28 y.o.   MRN: [id number] While in MAU I was informed fetal heart rate deceleration into the 70s had been observed Pitocin  had been at . It was stopped, terbutaline  administered and position changes employed. Foley was removed and pt noted to be 4cm dilated/80/-2; vertex well applied  I performed an AROM with moderate blood tinged fluid noted Placed FSE and IUPC.  Fetal heart rate improvement noted  Plan: monitor for next 30-58mins. If augmentation indicated, will start at and increase q 1mu Epidural prn  Expectant mgmt

## 2024-01-18 NOTE — Anesthesia Postprocedure Evaluation (Signed)
 Anesthesia Post Note  Patient: Susan Branch  Procedure(s) Performed: CESAREAN DELIVERY     Patient location during evaluation: PACU Anesthesia Type: General Level of consciousness: awake and alert Pain management: pain level controlled Vital Signs Assessment: post-procedure vital signs reviewed and stable Respiratory status: spontaneous breathing, nonlabored ventilation, respiratory function stable and patient connected to nasal cannula oxygen Cardiovascular status: blood pressure returned to baseline and stable Postop Assessment: no apparent nausea or vomiting Anesthetic complications: no   No notable events documented.  Last Vitals:  Vitals:   01/18/24 1740 01/18/24 1857  BP: 119/81 124/86  Pulse: 86 79  Resp: 18 18  Temp: 36.9 C 36.6 C  SpO2: 95% 98%    Last Pain:  Vitals:   01/18/24 1857  TempSrc: Oral  PainSc: 0-No pain   Pain Goal:                Epidural/Spinal Function Cutaneous sensation: Normal sensation (01/18/24 1857), Patient able to flex knees: Yes (01/18/24 1857), Patient able to lift hips off bed: Yes (01/18/24 1857), Back pain beyond tenderness at insertion site: No (01/18/24 1857), Progressively worsening motor and/or sensory loss: No (01/18/24 1857), Bowel and/or bladder incontinence post epidural: No (01/18/24 1857)  Agam Tuohy

## 2024-01-18 NOTE — H&P (Signed)
 Susan Branch is a 28 y.o.G66P1011 female presenting at 4 2/7wks for TOLAC. Her pregnancy has been complicated by: Previous c/s x 1 - Risks of TOLAC reviewed. Consent signed 50% chance  GDMA2- 20u insulin  at bedtime/ 4u qam - well controlled History of gestational hypertension - asa Fetal arrythmia noted but nl fetal echo  GBS carrier  NIPT expected range  OB History     Gravida  2   Para  1   Term  1   Preterm  0   AB  0   Living  1      SAB  0   IAB  0   Ectopic  0   Multiple  0   Live Births  1          Past Medical History:  Diagnosis Date   Allergy    Anxiety    Borderline hypertension    no current med.   Gestational diabetes    Headache    Tonsillar and adenoid hypertrophy 05/2013   snores during sleep, mother denies apnea   Past Surgical History:  Procedure Laterality Date   CESAREAN SECTION N/A 08/15/2022   Procedure: CESAREAN SECTION;  Surgeon: Loa Riling, DO;  Location: [facility];  Service: Obstetrics;  Laterality: N/A;   CHOLECYSTECTOMY N/A 12/30/2022   Procedure: LAPAROSCOPIC CHOLECYSTECTOMY;  Surgeon: Lockie Rima, MD;  Location: [facility] OR;  Service: General;  Laterality: N/A;   TONSILLECTOMY AND ADENOIDECTOMY N/A 06/02/2013   Procedure: TONSILLECTOMY AND ADENOIDECTOMY;  Surgeon: Lawence Press, MD;  Location: [facility];  Service: ENT;  Laterality: N/A;   TURBINATE REDUCTION Bilateral 06/02/2013   Procedure: TURBINATE REDUCTION BILATERAL;  Surgeon: Lawence Press, MD;  Location: [facility];  Service: ENT;  Laterality: Bilateral;   WISDOM TOOTH EXTRACTION     all four 3/24   Family History: family history includes Bipolar disorder in her father; Depression in her father; Diabetes in her father, mother, paternal grandfather, and paternal grandmother; Heart disease in her father; Hyperlipidemia in her father; Hypertension in her father; Kidney disease in her father; Kidney failure in her paternal grandfather and  paternal grandmother; Muscular dystrophy in her paternal grandfather; Stroke in her maternal grandmother; Vision loss in her father and maternal grandmother. Social History:  reports that she has never smoked. She has been exposed to tobacco smoke. She has never used smokeless tobacco. She reports that she does not currently use drugs after having used the following drugs: Marijuana. Frequency: 4.00 times per week. She reports that she does not drink alcohol.     Maternal Diabetes: Yes:  Diabetes Type:  Insulin /Medication controlled Genetic Screening: Normal Maternal Ultrasounds/Referrals: Normal Fetal Ultrasounds or other Referrals:  Fetal echo Maternal Substance Abuse:  No Significant Maternal Medications:  Meds include: Other:  insulin   Significant Maternal Lab Results:  Group B Strep positive Number of Prenatal Visits:greater than 3 verified prenatal visits Maternal Vaccinations:TDap and flu  Other Comments:  None  Review of Systems  Constitutional:  Positive for fatigue. Negative for activity change.  Eyes:  Negative for photophobia and visual disturbance.  Respiratory:  Negative for chest tightness and shortness of breath.   Cardiovascular:  Positive for leg swelling. Negative for chest pain and palpitations.  Gastrointestinal:  Negative for abdominal pain.  Genitourinary:  Positive for pelvic pain. Negative for vaginal bleeding.  Musculoskeletal:  Positive for back pain.  Neurological:  Negative for light-headedness and headaches.  Psychiatric/Behavioral:  The patient is  nervous/anxious.    Maternal Medical History:  Reason for admission: TOLAC  Contractions: Frequency: rare.   Perceived severity is mild.   Fetal activity: Perceived fetal activity is normal.   Prenatal Complications - Diabetes: gestational. Diabetes is managed by insulin  injections.       currently breastfeeding. Maternal Exam:  Uterine Assessment: Contraction frequency is rare.  Abdomen: Patient  reports generalized tenderness.  Estimated fetal weight is AGA.   Fetal presentation: vertex Introitus: Normal vulva. Vulva is negative for condylomata and lesion.  Normal vagina.  Vagina is negative for condylomata and discharge.  Cervix: Cervix evaluated by digital exam.     Fetal Exam Fetal Monitor Review: Baseline rate: 140.    Physical Exam Constitutional:      Appearance: Normal appearance. She is obese.  Cardiovascular:     Rate and Rhythm: Normal rate.  Pulmonary:     Effort: Pulmonary effort is normal.  Abdominal:     Tenderness: There is generalized abdominal tenderness.  Genitourinary:    General: Normal vulva.  Vulva is no lesion.     Vagina: No vaginal discharge.  Musculoskeletal:        General: Normal range of motion.     Cervical back: Normal range of motion.  Skin:    General: Skin is warm and dry.     Capillary Refill: Capillary refill takes 2 to 3 seconds.  Neurological:     General: No focal deficit present.     Mental Status: She is alert and oriented to person, place, and time. Mental status is at baseline.  Psychiatric:        Mood and Affect: Mood normal.        Behavior: Behavior normal.        Thought Content: Thought content normal.        Judgment: Judgment normal.     Prenatal labs: ABO, Rh:   Antibody: Negative (11/25 0000) Rubella: Immune (11/25 0000) RPR: Nonreactive (11/25 0000)  HBsAg: Negative (11/25 0000)  HIV: Non-reactive (11/25 0000)  GBS: Positive/-- (04/16 0000)   Assessment/Plan: 86ON [id number] female here for IOL for TOLAC , GDMA2 - stable  - Admit  - Monitor blood glucose  - IOL with foley bulb and pitocin  if needed - GBS carrier - treat with PCN  - Expectant mgmt    Susan Branch 01/18/2024, 8:54 AM

## 2024-01-19 LAB — CBC
HCT: 28.3 % — ABNORMAL LOW (ref 36.0–46.0)
Hemoglobin: 9.8 g/dL — ABNORMAL LOW (ref 12.0–15.0)
MCH: 28.5 pg (ref 26.0–34.0)
MCHC: 34.6 g/dL (ref 30.0–36.0)
MCV: 82.3 fL (ref 80.0–100.0)
Platelets: 164 10*3/uL (ref 150–400)
RBC: 3.44 MIL/uL — ABNORMAL LOW (ref 3.87–5.11)
RDW: 13.2 % (ref 11.5–15.5)
WBC: 9.7 10*3/uL (ref 4.0–10.5)
nRBC: 0 % (ref 0.0–0.2)

## 2024-01-19 MED ORDER — IBUPROFEN 600 MG PO TABS
600.0000 mg | ORAL_TABLET | Freq: Four times a day (QID) | ORAL | Status: DC | PRN
  Administered 2024-01-19 – 2024-01-21 (×4): 600 mg via ORAL
  Filled 2024-01-19 (×4): qty 1

## 2024-01-19 NOTE — Progress Notes (Addendum)
 Subjective: Postpartum Day 1: Cesarean Delivery Patient reports incisional pain, tolerating PO, + flatus, and no problems voiding.  She denies HA, CP, SOB. She reports pain controlled with meds - due now. She is breastfeeding and bonding well with daughter. Ambulated to bathroom  Objective: Vital signs in last 24 hours: Temp:  [96.9 F (36.1 C)-98.6 F (37 C)] 97.8 F (36.6 C) (05/11 0447) Pulse Rate:  [72-109] 88 (05/11 0447) Resp:  [18-20] 18 (05/11 0447) BP: (105-146)/(68-103) 123/85 (05/11 0447) SpO2:  [91 %-99 %] 99 % (05/11 0447) Weight:  [120.5 kg] 120.5 kg (05/10 1038)  Physical Exam:  General: alert, cooperative, and no distress Lochia: appropriate Uterine Fundus: firm Incision: no significant drainage DVT Evaluation: No evidence of DVT seen on physical exam.  Recent Labs    01/18/24 0832 01/19/24 0507  HGB 10.8* 9.8*  HCT 30.8* 28.3*    Assessment/Plan: Status post Cesarean section. Doing well postoperatively.  Continue current care. Monitor BP - currently stable.  CBG wnl.   Levis Reams, DO 01/19/2024, 9:07 AM

## 2024-01-19 NOTE — Progress Notes (Signed)
 Patient request ibuprofen  for cramping writer called provider for order.

## 2024-01-19 NOTE — Progress Notes (Signed)
 CSW received consult for hx of Anxiety and Depression. CSW met with MOB to offer support and complete assessment. When CSW entered room, MOB was observed laying in hospital bed holding infant. FOB and visitor were present. CSW introduced self and requested to speak with MOB alone. FOB and visitor left room. CSW explained reason for consult. MOB presented as calm, was agreeable to consult, and remained engaged during encounter.   CSW inquired how MOB is feeling emotionally since infant's arrival. MOB states she is feeling "pretty good." MOB expressed feelings of relief now that infant is here. CSW inquired about MOB's mental health history. MOB acknowledged a history of anxiety and depression, stating she was first diagnosed in [employer]. CSW inquired about symptoms during pregnancy/current treatment. MOB reports she did endorse symptoms of anxiety during pregnancy, marked by worrying if everything would be okay. MOB reports she is not current with a therapist or prescribed mental health medication. MOB declined outpatient mental health resources at this time. CSW inquired about a history of postpartum depression. MOB reports she felt fine after the birth of her son aside from situational stressors at the time but did not feel she experienced symptoms of postpartum depression. MOB identified FOB, her mom, and cousin as supports. MOB denied current SI/HI/DV.  CSW provided education regarding the baby blues period vs. perinatal mood disorders, discussed treatment and gave resources for mental health follow up if concerns arise.  CSW recommends self-evaluation during the postpartum time period using the New Mom Checklist from Postpartum Progress and encouraged MOB to contact a medical professional if symptoms are noted at any time.    MOB reports she has all needed items for infant, including a car seat and bassinet. MOB has chosen [facility] for infant's follow up care.  CSW provided review of  Sudden Infant Death Syndrome (SIDS) precautions.    CSW identifies no further need for intervention and no barriers to discharge at this time.  Signed,  Elizabeth Gulling, MSW, LCSWA, LCASA 01/19/2024 6:18 PM

## 2024-01-19 NOTE — Progress Notes (Signed)
 Foley was dc'd @ 0430

## 2024-01-19 NOTE — Progress Notes (Signed)
 CSW acknowledges consult placed due to history of anxiety/depression. CSW attempted to meet with MOB; however, visitors were present when CSW entered room and MOB requested CSW return at a later time. CSW to follow up.   Signed,  Elizabeth Gulling, MSW, LCSWA, LCASA 01/19/2024 4:18 PM

## 2024-01-20 ENCOUNTER — Encounter (HOSPITAL_COMMUNITY): Payer: Self-pay | Admitting: Obstetrics and Gynecology

## 2024-01-20 NOTE — Lactation Note (Signed)
 This note was copied from a baby's chart. Lactation Consultation Note  Patient Name: Susan Branch [id number] Date: 01/20/2024 Age:26 hours Reason for consult: Follow-up assessment;Term  P2, 39 wks, @ 47 hrs of life. Mom has questions. Encouraged breasts respond faster/easier with each baby, need to work on big mouth latching with baby. Highlighted cluster feeding very normal, brings in milk supply.  Infant ready to feed, mom receptive to putting baby to breast. Mom desires to see positioning options, cross-cradle and football demonstrated. Mom using hand expression, praised. Highlighted hand expression, breast compression, and gently stimulating baby will keep her working @ the breast. Hand pump provided, with variety of flanges- encouraged dynamic with breast changes. Mom still feeding with LC exit.  Maternal Data Has patient been taught Hand Expression?: Yes Does the patient have breastfeeding experience prior to this delivery?: Yes How long did the patient breastfeed?: 6 months  Feeding Mother's Current Feeding Choice: Breast Milk and Formula  LATCH Score Latch: Grasps breast easily, tongue down, lips flanged, rhythmical sucking.  Audible Swallowing: Spontaneous and intermittent  Type of Nipple: Everted at rest and after stimulation  Comfort (Breast/Nipple): Soft / non-tender  Hold (Positioning): Assistance needed to correctly position infant at breast and maintain latch.  LATCH Score: 9   Lactation Tools Discussed/Used Tools: Pump;Flanges Flange Size: 21 Breast pump type: Manual Pump Education: Milk Storage;Setup, frequency, and cleaning  Interventions    Discharge Pump: DEBP;Manual;Personal  Consult Status Consult Status: Follow-up Date: 01/21/24 Follow-up type: In-patient    Mikaiya Tramble 01/20/2024, 2:53 PM

## 2024-01-20 NOTE — Progress Notes (Deleted)
 Susan Branch is a No birth weight on file. newborn infant born at 28 y.o. Mom reports feeding well  Output/Feedings:  Breastfeeding x 7 Bottle x 1 (15) Voids x 3 Stools x 4  Vital signs in last 24 hours: Temp:  [97.6 F (36.4 C)-99 F (37.2 C)] 97.6 F (36.4 C) (05/12 0517) Pulse Rate:  [89-96] 89 (05/12 0517) Resp:  [17-18] 18 (05/12 0517)  Weight: 120.5 kg (01/18/24 1038)   %change from birthwt: Birth weight not on file  Physical Exam:  AFOF Chest/Lungs: clear to auscultation, no grunting, flaring, or retracting Heart/Pulse: no murmur Abdomen/Cord: non-distended, soft, nontender, no organomegaly Genitalia: female and anus patent Skin & Color: no rashes Neurological: normal tone, moves all extremities  Jaundice Assessment:  Recent Labs  Lab 01/18/24 0832  BILITOT 0.5    Bilirubin level is >7 mg/dL below phototherapy threshold and age is <72 hours old. Routine Tcb in am.   28 y.o. Gestational Age: <None> old newborn, doing well.  Routine care      Assessment & Plan Previous cesarean delivery affecting pregnancy Infant ready for discharge once mom is ready - either today or tomorrow   Interpreter present: no  Illene Malm, MD 01/20/2024, 12:08 PM

## 2024-01-20 NOTE — Progress Notes (Signed)
 Subjective: Postpartum Day 2: Cesarean Delivery Patient is doing well this morning. Pain is better controlled today with addition of ibuprofen  last night but reports worsening of pain with ambulating or coughing. Ambulating, voiding, tolerating PO. Minimal lochia.   Objective: Patient Vitals for the past 24 hrs:  BP Temp Temp src Pulse Resp SpO2  01/20/24 0517 134/82 97.6 F (36.4 C) Oral 89 18 99 %  01/19/24 2010 124/82 99 F (37.2 C) Oral 96 18 99 %  01/19/24 1503 122/82 98.7 F (37.1 C) -- 92 17 99 %   Physical Exam:  General: alert, cooperative, and no distress Lochia: appropriate Uterine Fundus: firm Incision: healing well, no dehiscence, no significant erythema, small area of blood staining on right side of incision DVT Evaluation: No evidence of DVT seen on physical exam.  Recent Labs    01/18/24 0832 01/19/24 0507  HGB 10.8* 9.8*  HCT 30.8* 28.3*    Assessment/Plan: Ubah L Whitcher G2P2002 POD#2 sp RCS at [redacted]w[redacted]d under GETA, failed TOLAC 1. PPC: routine PP care, request abdominal binder 2. Rh pos 3. A2GDM: fasting CBG tomorrow if remains inpatient 4. Dispo: discharge later today vs. Tomorrow pending pain control    Theodoro Fisherman 01/20/2024, 11:00 AM

## 2024-01-20 NOTE — Assessment & Plan Note (Deleted)
 Infant ready for discharge once mom is ready - either today or tomorrow

## 2024-01-21 LAB — GLUCOSE, CAPILLARY: Glucose-Capillary: 126 mg/dL — ABNORMAL HIGH (ref 70–99)

## 2024-01-21 MED ORDER — IBUPROFEN 600 MG PO TABS: 600.0000 mg | ORAL_TABLET | Freq: Four times a day (QID) | ORAL | 0 refills | Status: DC | PRN

## 2024-01-21 MED ORDER — NIFEDIPINE ER 30 MG PO TB24: 30.0000 mg | ORAL_TABLET | Freq: Every day | ORAL | 0 refills | Status: DC

## 2024-01-21 MED ORDER — NIFEDIPINE ER OSMOTIC RELEASE 30 MG PO TB24
30.0000 mg | ORAL_TABLET | Freq: Every day | ORAL | Status: DC
  Administered 2024-01-21: 30 mg via ORAL
  Filled 2024-01-21: qty 1

## 2024-01-21 MED ORDER — OXYCODONE HCL 5 MG PO TABS: 5.0000 mg | ORAL_TABLET | Freq: Four times a day (QID) | ORAL | 0 refills | Status: DC | PRN

## 2024-01-21 NOTE — Progress Notes (Signed)
 Patient is doing well.  She is tolerating PO, ambulating, voiding.    Lochia is appropriate.  Notes hip / back pain.  Also cramping with latching--cramping pain is improved with heating pad.  When she took oxy yesterday, he pain was well controlled  Vitals:   01/20/24 1235 01/20/24 1345 01/21/24 0531 01/21/24 1003  BP: (!) 147/88 131/84 (!) 146/93 (!) 140/86  Pulse: 94 89 100   Resp: 18 18 18    Temp: 97.8 F (36.6 C)  98.9 F (37.2 C)   TempSrc: Oral     SpO2: 99%  100%   Weight:      Height:        NAD Abdomen:  soft, appropriate tenderness, incisions intact and without erythema or drainage ext:    Symmetric, trace edema bilaterally  Lab Results  Component Value Date   WBC 9.7 01/19/2024   HGB 9.8 (L) 01/19/2024   HCT 28.3 (L) 01/19/2024   MCV 82.3 01/19/2024   PLT 164 01/19/2024    --/--/B POS (05/10 0932)  A/P    28 y.o. G2P2002 POD #3 s/p RCS under GETA, failed TOLAC 1. PPC: routine PP care. Discussed goals for BP management.   2. Rh pos 3. A2GDM: CBG of 126 this AM was NOT fasting--she was snacking when it was taken.  Recommend monitoring fasting BG at home for the next couple of weeks and reviewing with Dr. Arlyne Lame at her incision check 4. 2 mild elevated BPs in last 12 hours.  Does have a history of PP GHTN with last pregnancy.  Will trend q2h BP x 3-4 checks and formulate plan of care 5. Dispo: home this afternoon vs tomorrow pending BP

## 2024-01-21 NOTE — Lactation Note (Signed)
 This note was copied from a baby's chart. Lactation Consultation Note  Patient Name: Girl Belvia Marcoux [id number] Date: 01/21/2024 Age:28 years  Reason for consult: Follow-up assessment;Term;Other (Comment)  P2, [redacted]w[redacted]d, 8% weight loss (-4g from yesterday)  Mother remains a patient to monitor blood pressure, potential d/c this afternoon. She states she is breastfeeding baby, pumped 15 ml of colostrum today, breast are filling and supplements with formula. Mother has a 56 month old at home that she breast fed. She denies any questions or concerns. Advised if pumping, to give her EBM instead of formula.   Discussed the process of milk production, "supply and demand" and the importance of breast stimulation and milk removal in order to make an optimal milk supply.  Discussed mother to breastfeed 8-12 times in 24 hours, skin to skin and breast feed before formula feeding.   If missed feedings at breast or substituting feeding with formula, advised to hand express and/or pump to remove milk from the breast.   Reviewed milk collection, storage and warming bottle in a cup with warm water  while here in the hospital. Mother had expressed milk and milk was approaching 4 hrs at room temperature. Mother plans to feed to baby after diaper change.   Mom made aware of O/P services, breastfeeding support groups, community resources, and our phone # for post-discharge questions.     Feeding Mother's Current Feeding Choice: Breast Milk and Formula Nipple Type: Slow - flow   Lactation Tools Discussed/Used Pumping frequency: prn Pumped volume: 15 mL  Interventions Interventions: Breast feeding basics reviewed;Education  Discharge Discharge Education: Engorgement and breast care;Warning signs for feeding baby  Consult Status Complete 01/21/2024  Esperanza Hedges 01/21/2024, 1:47 PM

## 2024-01-21 NOTE — Discharge Summary (Signed)
 Postpartum Discharge Summary  Date of Service updated 01/21/24     Patient Name: Susan Branch DOB: August 11, 1996 MRN: [id number]  Date of admission: 01/18/2024 Delivery date:01/18/2024 Delivering provider: Sandra Crouch [id number] [facility] Date of discharge: 01/21/2024  Admitting diagnosis: Previous cesarean delivery affecting pregnancy [O34.219] Intrauterine pregnancy: [redacted]w[redacted]d     Secondary diagnosis:  Principal Problem:   Previous cesarean delivery affecting pregnancy  Additional problems: GDMA2    Discharge diagnosis: Term Pregnancy Delivered, Gestational Hypertension, and GDM A2                                              Post partum procedures:n/a Augmentation: Pitocin  Complications: None  Hospital course: Induction of Labor With Cesarean Section   28 y.o. yo G2P2002 at [redacted]w[redacted]d was admitted to the hospital 01/18/2024 for induction of labor for GDMA2 in the setting of prior cesarean delivery. Patient had a labor course significant for recurrent category 2 tracing and repeat cesarean section under general anesthesia due to a prolonged fetal heart rate deceleration. The patient went for cesarean section due to Non-Reassuring FHR. Delivery details are as follows: Membrane Rupture Time/Date: 1:20 PM,01/18/2024  Delivery Method:C-Section, Low Transverse Operative Delivery:N/A Details of operation can be found in separate operative Note.  Patient had a postpartum course complicated by mild elevation of systolic BP on POD#3.  She was started on procardia  Xl 30mg  daily, in part due to her history of elevated BP in the postpartum period following first delivery.  She was feeling well and requested discharge on POD#3. She is ambulating, tolerating a regular diet, passing flatus, and urinating well.  Patient is discharged home in stable condition on 01/21/24.      Newborn Data: Birth date:01/18/2024 Birth time:3:15 PM Gender:Female Living status:Living Apgars:8 ,9  Weight:3940 g                                Magnesium Sulfate received: No BMZ received: No Rhophylac:N/A MMR:N/A T-DaP:Given prenatally Flu: Yes RSV Vaccine received: No Transfusion:No Immunizations administered: Immunization History  Administered Date(s) Administered   DTaP 01/27/1998, 04/22/1998, 09/23/1998, 05/05/1999, 12/19/2001   HIB (PRP-OMP) 01/27/1998, 04/22/1998, 11/09/1998   HPV Quadrivalent 05/23/2010, 04/22/2014   Hepatitis A, Ped/Adol-2 Dose 06/20/2016, 09/25/2017   Hepatitis B 11/11/1997, 01/27/1998, 09/23/1998   IPV 01/27/1998, 04/22/1998, 05/05/1999, 12/19/2001   Influenza Split 05/24/2022   Influenza,inj,Quad PF,6+ Mos 06/16/2014, 06/20/2016, 09/25/2017   Influenza-Unspecified 05/26/2008, 05/23/2010, 09/27/2011   MMR 11/09/1998, 12/19/2001   Meningococcal B, OMV 06/20/2016, 09/25/2017   Meningococcal Conjugate 04/22/2014   Moderna Sars-Covid-2 Vaccination 02/22/2020, 03/21/2020   Pneumococcal Conjugate-13 11/01/1999   Td 03/08/2008   Tdap 03/08/2008, 05/04/2020   Varicella 11/09/1998, 12/19/2001    Physical exam  Vitals:   01/21/24 1003 01/21/24 1159 01/21/24 1411 01/21/24 1607  BP: (!) 140/86 (!) 142/72 131/80 (!) 141/83  Pulse:  92 90   Resp:  18 18   Temp:   98.6 F (37 C)   TempSrc:   Oral   SpO2:  100% 100%   Weight:      Height:       General: alert, cooperative, and no distress Lochia: appropriate Uterine Fundus: firm Incision: Healing well with no significant drainage, Dressing is clean, dry, and intact DVT Evaluation: No evidence of DVT seen on physical exam. Labs: Lab  Results  Component Value Date   WBC 9.7 01/19/2024   HGB 9.8 (L) 01/19/2024   HCT 28.3 (L) 01/19/2024   MCV 82.3 01/19/2024   PLT 164 01/19/2024      Latest Ref Rng & Units 01/18/2024    8:32 AM  CMP  Glucose 70 - 99 mg/dL 77   BUN 6 - 20 mg/dL 9   Creatinine 1.61 - 0.96 mg/dL 0.45   Sodium 409 - 811 mmol/L 136   Potassium 3.5 - 5.1 mmol/L 3.7   Chloride 98 - 111 mmol/L 108   CO2 22 - 32 mmol/L 18    Calcium  8.9 - 10.3 mg/dL 9.0   Total Protein 6.5 - 8.1 g/dL 6.5   Total Bilirubin 0.0 - 1.2 mg/dL 0.5   Alkaline Phos 38 - 126 U/L 103   AST 15 - 41 U/L 20   ALT 0 - 44 U/L 15    Edinburgh Score:    01/19/2024    6:35 PM  Edinburgh Postnatal Depression Scale Screening Tool  I have been able to laugh and see the funny side of things. 0  I have looked forward with enjoyment to things. 1  I have blamed myself unnecessarily when things went wrong. 1  I have been anxious or worried for no good reason. 1  I have felt scared or panicky for no good reason. 2  Things have been getting on top of me. 2  I have been so unhappy that I have had difficulty sleeping. 0  I have felt sad or miserable. 0  I have been so unhappy that I have been crying. 0  The thought of harming myself has occurred to me. 0  Edinburgh Postnatal Depression Scale Total 7      After visit meds:  Allergies as of 01/21/2024       Reactions   Adhesive [tape] Rash   Latex Rash   Soap Rash        Medication List     STOP taking these medications    cetirizine  10 MG tablet Commonly known as: ZyrTEC  Allergy   fluticasone  50 MCG/ACT nasal spray Commonly known as: FLONASE    furosemide  20 MG tablet Commonly known as: Lasix    oseltamivir  75 MG capsule Commonly known as: TAMIFLU        TAKE these medications    ibuprofen  600 MG tablet Commonly known as: ADVIL  Take 1 tablet (600 mg total) by mouth every 6 (six) hours as needed for cramping.   Iron  (Ferrous Sulfate ) 325 (65 Fe) MG Tabs Take 325 mg by mouth every morning.   NIFEdipine  30 MG 24 hr tablet Commonly known as: ADALAT  CC Take 1 tablet (30 mg total) by mouth daily. Start taking on: Jan 22, 2024   oxyCODONE  5 MG immediate release tablet Commonly known as: Oxy IR/ROXICODONE  Take 1 tablet (5 mg total) by mouth every 6 (six) hours as needed for moderate pain (pain score 4-6). What changed: how much to take   PRENATAL PO Take 1 tablet by  mouth daily.         Discharge home in stable condition Infant Feeding: Breast Infant Disposition:home with mother Discharge instruction: per After Visit Summary and Postpartum booklet. Activity: Advance as tolerated. Pelvic rest for 6 weeks.  Diet: carb modified diet Anticipated Birth Control: Unsure Postpartum Appointment:2 weeks Additional Postpartum F/U: BP check 2-3 days Future Appointments:No future appointments. Follow up Visit:  Follow-up Information     Loa Riling, DO Follow up  in 3 day(s).   Specialty: Obstetrics and Gynecology Why: BP check Contact information: [address] [place] [address] [place] [address] [phone]                     01/21/2024 [facility] Belinda Bowler, MD

## 2024-01-21 NOTE — Progress Notes (Signed)
 Patient feeling much better.  Due to persistent systolic BP in the low 140s, nifedipine  Xl 30mg  daily was started this AM.  She has requested discharge today.  She can reliably follow up for a BP check in 3-4 days, so I think it is reasonable.  Discharge orders to be placed

## 2024-01-23 ENCOUNTER — Encounter (HOSPITAL_COMMUNITY): Payer: Self-pay

## 2024-01-23 ENCOUNTER — Inpatient Hospital Stay (HOSPITAL_COMMUNITY): Admit: 2024-01-23 | Payer: 59 | Admitting: Obstetrics and Gynecology

## 2024-01-23 SURGERY — Surgical Case
Anesthesia: Spinal

## 2024-01-29 ENCOUNTER — Encounter (HOSPITAL_COMMUNITY): Payer: Self-pay | Admitting: Obstetrics and Gynecology

## 2024-01-29 ENCOUNTER — Inpatient Hospital Stay (HOSPITAL_COMMUNITY)
Admission: AD | Admit: 2024-01-29 | Discharge: 2024-01-29 | Disposition: A | Discharge status: Home / Self Care | Attending: Obstetrics and Gynecology | Admitting: Obstetrics and Gynecology

## 2024-01-29 ENCOUNTER — Other Ambulatory Visit: Payer: Self-pay

## 2024-01-29 DIAGNOSIS — O165 Unspecified maternal hypertension, complicating the puerperium: Secondary | ICD-10-CM | POA: Insufficient documentation

## 2024-01-29 DIAGNOSIS — O9089 Other complications of the puerperium, not elsewhere classified: Secondary | ICD-10-CM | POA: Diagnosis not present

## 2024-01-29 DIAGNOSIS — R519 Headache, unspecified: Secondary | ICD-10-CM | POA: Diagnosis not present

## 2024-01-29 LAB — COMPREHENSIVE METABOLIC PANEL WITH GFR
ALT: 13 U/L (ref 0–44)
AST: 15 U/L (ref 15–41)
Albumin: 3 g/dL — ABNORMAL LOW (ref 3.5–5.0)
Alkaline Phosphatase: 76 U/L (ref 38–126)
Anion gap: 9 (ref 5–15)
BUN: 14 mg/dL (ref 6–20)
CO2: 22 mmol/L (ref 22–32)
Calcium: 8.7 mg/dL — ABNORMAL LOW (ref 8.9–10.3)
Chloride: 108 mmol/L (ref 98–111)
Creatinine, Ser: 0.78 mg/dL (ref 0.44–1.00)
GFR, Estimated: >60 mL/min (ref >60)
Glucose, Bld: 109 mg/dL — ABNORMAL HIGH (ref 70–99)
Potassium: 4 mmol/L (ref 3.5–5.1)
Sodium: 139 mmol/L (ref 135–145)
Total Bilirubin: 0.2 mg/dL (ref 0.0–1.2)
Total Protein: 6.4 g/dL — ABNORMAL LOW (ref 6.5–8.1)

## 2024-01-29 LAB — CBC
HCT: 31.4 % — ABNORMAL LOW (ref 36.0–46.0)
Hemoglobin: 10.4 g/dL — ABNORMAL LOW (ref 12.0–15.0)
MCH: 27.7 pg (ref 26.0–34.0)
MCHC: 33.1 g/dL (ref 30.0–36.0)
MCV: 83.5 fL (ref 80.0–100.0)
Platelets: 394 10*3/uL (ref 150–400)
RBC: 3.76 MIL/uL — ABNORMAL LOW (ref 3.87–5.11)
RDW: 12.9 % (ref 11.5–15.5)
WBC: 12.5 10*3/uL — ABNORMAL HIGH (ref 4.0–10.5)
nRBC: 0 % (ref 0.0–0.2)

## 2024-01-29 MED ORDER — CYCLOBENZAPRINE HCL 5 MG PO TABS
5.0000 mg | ORAL_TABLET | Freq: Three times a day (TID) | ORAL | 0 refills | Status: DC | PRN
Start: 2024-01-29 — End: 2024-04-16

## 2024-01-29 MED ORDER — IBUPROFEN 600 MG PO TABS
600.0000 mg | ORAL_TABLET | Freq: Once | ORAL | Status: AC
  Administered 2024-01-29: 600 mg via ORAL
  Filled 2024-01-29: qty 1

## 2024-01-29 MED ORDER — NIFEDIPINE ER OSMOTIC RELEASE 30 MG PO TB24
60.0000 mg | ORAL_TABLET | Freq: Once | ORAL | Status: AC
  Administered 2024-01-29: 60 mg via ORAL
  Filled 2024-01-29: qty 2

## 2024-01-29 MED ORDER — CYCLOBENZAPRINE HCL 5 MG PO TABS
10.0000 mg | ORAL_TABLET | Freq: Once | ORAL | Status: AC
  Administered 2024-01-29: 10 mg via ORAL
  Filled 2024-01-29: qty 2

## 2024-01-29 MED ORDER — NIFEDIPINE ER 30 MG PO TB24: 60.0000 mg | ORAL_TABLET | Freq: Every day | ORAL | 1 refills | Status: AC

## 2024-01-29 NOTE — MAU Provider Note (Signed)
 Chief Complaint: Hypertension and Headache   Event Date/Time   First Provider Initiated Contact with Patient 01/29/24 1359      SUBJECTIVE HPI: Susan Branch is a 28 y.o. G2P2002 who is 11 days s/p cesarean section who presents to maternity admissions sent from the office for HTN and headache. Her h/a was worse yesterday, and not relieved by Tylenol  or oxycodone  prescribe for her cesarean.  Today, the h/a is 3/10, but still not resolving with Tylenol .  BP was elevated at her office visit so she was sent to MAU for evaluation.   HPI  Past Medical History:  Diagnosis Date   Allergy    Anxiety    Borderline hypertension    no current med.   Gestational diabetes    Headache    Tonsillar and adenoid hypertrophy 05/2013   snores during sleep, mother denies apnea   Past Surgical History:  Procedure Laterality Date   CESAREAN SECTION N/A 08/15/2022   Procedure: CESAREAN SECTION;  Surgeon: Loa Riling, DO;  Location: [facility];  Service: Obstetrics;  Laterality: N/A;   CESAREAN SECTION N/A 01/18/2024   Procedure: CESAREAN DELIVERY;  Surgeon: Loa Riling, DO;  Location: [facility];  Service: Obstetrics;  Laterality: N/A;   CHOLECYSTECTOMY N/A 12/30/2022   Procedure: LAPAROSCOPIC CHOLECYSTECTOMY;  Surgeon: Lockie Rima, MD;  Location: [facility] OR;  Service: General;  Laterality: N/A;   TONSILLECTOMY AND ADENOIDECTOMY N/A 06/02/2013   Procedure: TONSILLECTOMY AND ADENOIDECTOMY;  Surgeon: Lawence Press, MD;  Location: [facility];  Service: ENT;  Laterality: N/A;   TURBINATE REDUCTION Bilateral 06/02/2013   Procedure: TURBINATE REDUCTION BILATERAL;  Surgeon: Lawence Press, MD;  Location: [facility];  Service: ENT;  Laterality: Bilateral;   WISDOM TOOTH EXTRACTION     all four 3/24   Social History   Socioeconomic History   Marital status: Single    Spouse name: Not on file   Number of children: Not on file   Years of education: Not on file   Highest  education level: Not on file  Occupational History   Not on file  Tobacco Use   Smoking status: Never    Passive exposure: Yes   Smokeless tobacco: Never  Vaping Use   Vaping status: Never Used  Substance and Sexual Activity   Alcohol use: No   Drug use: Not Currently    Frequency: 4.0 times per week    Types: Marijuana   Sexual activity: Yes    Birth control/protection: Implant  Other Topics Concern   Not on file  Social History Narrative   Not on file   Social Drivers of Health   Financial Resource Strain: Low Risk  (02/14/2022)   Overall Financial Resource Strain (CARDIA)    Difficulty of Paying Living Expenses: Not very hard  Food Insecurity: No Food Insecurity (01/18/2024)   Hunger Vital Sign    Worried About Running Out of Food in the Last Year: Never true    Ran Out of Food in the Last Year: Never true  Transportation Needs: No Transportation Needs (01/18/2024)   PRAPARE - Administrator, Civil Service (Medical): No    Lack of Transportation (Non-Medical): No  Physical Activity: Sufficiently Active (02/14/2022)   Exercise Vital Sign    Days of Exercise per Week: 7 days    Minutes of Exercise per Session: 70 min  Stress: No Stress Concern Present (02/14/2022)   [employer] of Occupational Health -  Occupational Stress Questionnaire    Feeling of Stress : Only a little  Social Connections: Moderately Integrated (01/18/2024)   Social Connection and Isolation Panel [NHANES]    Frequency of Communication with Friends and Family: More than three times a week    Frequency of Social Gatherings with Friends and Family: More than three times a week    Attends Religious Services: More than 4 times per year    Active Member of [employer] or Organizations: Yes    Attends Banker Meetings: More than 4 times per year    Marital Status: Never married  Intimate Partner Violence: Patient Unable To Answer (01/18/2024)   Humiliation, Afraid, Rape, and Kick  questionnaire    Fear of Current or Ex-Partner: Patient unable to answer    Emotionally Abused: Patient unable to answer    Physically Abused: Patient unable to answer    Sexually Abused: Patient unable to answer   No current facility-administered medications on file prior to encounter.   Current Outpatient Medications on File Prior to Encounter  Medication Sig Dispense Refill   acetaminophen  (TYLENOL ) 500 MG tablet Take 500 mg by mouth every 6 (six) hours as needed.     ibuprofen  (ADVIL ) 600 MG tablet Take 1 tablet (600 mg total) by mouth every 6 (six) hours as needed for cramping. 30 tablet 0   oxyCODONE  (OXY IR/ROXICODONE ) 5 MG immediate release tablet Take 1 tablet (5 mg total) by mouth every 6 (six) hours as needed for moderate pain (pain score 4-6). 20 tablet 0   Prenatal Vit-Fe Fumarate-FA (PRENATAL PO) Take 1 tablet by mouth daily.     Iron , Ferrous Sulfate , 325 (65 Fe) MG TABS Take 325 mg by mouth every morning. (Patient not taking: Reported on 09/14/2022) 90 tablet 3   [DISCONTINUED] norgestimate -ethinyl estradiol  (SPRINTEC 28) 0.25-35 MG-MCG tablet Take 1 tablet by mouth daily. 1 Package 1   Allergies  Allergen Reactions   Adhesive [Tape] Rash   Latex Rash   Soap Rash    ROS:  Review of Systems  Constitutional:  Negative for chills, fatigue and fever.  Eyes:  Negative for visual disturbance.  Respiratory:  Negative for shortness of breath.   Cardiovascular:  Negative for chest pain.  Gastrointestinal:  Negative for abdominal pain, nausea and vomiting.  Genitourinary:  Negative for difficulty urinating, dysuria, flank pain, pelvic pain, vaginal bleeding, vaginal discharge and vaginal pain.  Neurological:  Positive for headaches. Negative for dizziness.  Psychiatric/Behavioral: Negative.       I have reviewed patient's Past Medical Hx, Surgical Hx, Family Hx, Social Hx, medications and allergies.   Physical Exam  Patient Vitals for the past 24 hrs:  BP Temp Temp src  Pulse Resp SpO2 Height Weight  01/29/24 1900 (!) 140/84 -- -- 69 -- 99 % -- --  01/29/24 1646 (!) 148/86 -- -- 81 -- -- -- --  01/29/24 1631 139/83 -- -- 83 -- -- -- --  01/29/24 1616 (!) 127/100 -- -- 83 -- -- -- --  01/29/24 1600 (!) 149/85 -- -- 84 -- 100 % -- --  01/29/24 1545 (!) 150/85 -- -- 88 -- 100 % -- --  01/29/24 1530 (!) 145/90 -- -- 81 -- 100 % -- --  01/29/24 1520 (!) 153/86 -- -- 82 -- -- -- --  01/29/24 1501 (!) 143/84 -- -- 78 -- -- -- --  01/29/24 1454 (!) 144/90 -- -- 77 -- -- -- --  01/29/24 1431 128/83 -- -- 73 -- -- -- --  01/29/24 1416 (!) 135/90 -- -- 70 -- -- -- --  01/29/24 1400 (!) 138/91 -- -- 80 -- 100 % -- --  01/29/24 1346 138/83 97.7 F (36.5 C) Oral 84 18 100 % -- --  01/29/24 1338 -- -- -- -- -- -- 5\' 3"  (1.6 m) 107 kg   Constitutional: Well-developed, well-nourished female in no acute distress.  Cardiovascular: normal rate Respiratory: normal effort GI: Abd soft, non-tender. Pos BS x 4 MS: Extremities nontender, no edema, normal ROM Neurologic: Alert and oriented x 4.  GU: Neg CVAT.  PELVIC EXAM: deferred   LAB RESULTS Results for orders placed or performed during the hospital encounter of 01/29/24 (from the past 24 hours)  CBC     Status: Abnormal   Collection Time: 01/29/24  2:04 PM  Result Value Ref Range   WBC 12.5 (H) 4.0 - 10.5 K/uL   RBC 3.76 (L) 3.87 - 5.11 MIL/uL   Hemoglobin 10.4 (L) 12.0 - 15.0 g/dL   HCT 21.3 (L) 08.6 - 57.8 %   MCV 83.5 80.0 - 100.0 fL   MCH 27.7 26.0 - 34.0 pg   MCHC 33.1 30.0 - 36.0 g/dL   RDW 46.9 62.9 - 52.8 %   Platelets 394 150 - 400 K/uL   nRBC 0.0 0.0 - 0.2 %  Comprehensive metabolic panel with GFR     Status: Abnormal   Collection Time: 01/29/24  2:04 PM  Result Value Ref Range   Sodium 139 135 - 145 mmol/L   Potassium 4.0 3.5 - 5.1 mmol/L   Chloride 108 98 - 111 mmol/L   CO2 22 22 - 32 mmol/L   Glucose, Bld 109 (H) 70 - 99 mg/dL   BUN 14 6 - 20 mg/dL   Creatinine, Ser 4.13 0.44 - 1.00  mg/dL   Calcium  8.7 (L) 8.9 - 10.3 mg/dL   Total Protein 6.4 (L) 6.5 - 8.1 g/dL   Albumin 3.0 (L) 3.5 - 5.0 g/dL   AST 15 15 - 41 U/L   ALT 13 0 - 44 U/L   Alkaline Phosphatase 76 38 - 126 U/L   Total Bilirubin 0.2 0.0 - 1.2 mg/dL   GFR, Estimated >24 >40 mL/min   Anion gap 9 5 - 15    --/--/B POS (05/10 0932)  IMAGING No results found.  MAU Management/MDM: Orders Placed This Encounter  Procedures   CBC   Comprehensive metabolic panel with GFR   Discharge patient Discharge disposition: 01-Home or Self Care; Discharge patient date: 01/29/2024   Discharge patient Discharge disposition: 01-Home or Self Care; Discharge patient date: 01/29/2024    Meds ordered this encounter  Medications   ibuprofen  (ADVIL ) tablet 600 mg   NIFEdipine  (PROCARDIA -XL/NIFEDICAL-XL) 24 hr tablet 60 mg   cyclobenzaprine  (FLEXERIL ) tablet 10 mg   NIFEdipine  (ADALAT  CC) 30 MG 24 hr tablet    Sig: Take 2 tablets (60 mg total) by mouth daily.    Dispense:  60 tablet    Refill:  1    Supervising Provider:   Avie Boeck A [[id number]]   cyclobenzaprine  (FLEXERIL ) 5 MG tablet    Sig: Take 1-2 tablets (5-10 mg total) by mouth 3 (three) times daily as needed for muscle spasms.    Dispense:  30 tablet    Refill:  0    Supervising Provider:   Avie Boeck A [[id number]]    PEC labs wnl, pt h/a reduced with ibuprofen . H/A minimal when discharged from hospital and taking nifedipine , and h/a  worsened 2 days ago.  Will continue nifedipine  and treat h/a but consider changing BP meds if headache persists even with normal BP.  Flexeril  10 mg PO given and h/a down to 1/10 prior to discharge.   D/C home with HTN return precautions BP check in office on Tuesday Pt to get home BP cuff and take daily/every other day to monitor Procardia  dose increased to 60 mg XL, and dose given in MAU since pt hasn't taken today's dose   ASSESSMENT 1. Postpartum hypertension   2. Acute nonintractable headache, unspecified headache  type     PLAN Discharge home Allergies as of 01/29/2024       Reactions   Adhesive [tape] Rash   Latex Rash   Soap Rash        Medication List     TAKE these medications    acetaminophen  500 MG tablet Commonly known as: TYLENOL  Take 500 mg by mouth every 6 (six) hours as needed.   cyclobenzaprine  5 MG tablet Commonly known as: FLEXERIL  Take 1-2 tablets (5-10 mg total) by mouth 3 (three) times daily as needed for muscle spasms.   ibuprofen  600 MG tablet Commonly known as: ADVIL  Take 1 tablet (600 mg total) by mouth every 6 (six) hours as needed for cramping.   Iron  (Ferrous Sulfate ) 325 (65 Fe) MG Tabs Take 325 mg by mouth every morning.   NIFEdipine  30 MG 24 hr tablet Commonly known as: ADALAT  CC Take 2 tablets (60 mg total) by mouth daily. What changed: how much to take   oxyCODONE  5 MG immediate release tablet Commonly known as: Oxy IR/ROXICODONE  Take 1 tablet (5 mg total) by mouth every 6 (six) hours as needed for moderate pain (pain score 4-6).   PRENATAL PO Take 1 tablet by mouth daily.        Follow-up Information     [facility] Follow up.   Why: As scheduled Contact information: [address] [place] [address] [phone]         [facility] Follow up.   Specialty: Obstetrics and Gynecology Why: As needed for emergencies Contact information: [address] [place]  [address] [phone]                Arlester Bence Certified Nurse-Midwife 01/29/2024  8:29 PM

## 2024-01-29 NOTE — MAU Note (Addendum)
 MAU Triage Note  Susan Branch is a 28 y.o.  PP C-section (5/10) here in MAU reporting: she was seen at [facility] today and was sent from the office for her elevated BP. She reports a headache that she's had for the past 2 days. She took tylenol  1000mg  today around 12pm for her incision pain, and reports it relieved her incision pain, however, she still has the headache. Denies visual disturbances and epigastric pain today, but reports she had blurry vision and epigastric pain yesterday that has resolved. She reports she hasn't taken her nifedipine  today, because she was being sent here and was unsure if she should take it or not.    Onset of complaint: HA started 5/19 Pain score: 4/10 Vitals:   01/29/24 1346  BP: 138/83  Pulse: 84  Resp: 18  Temp: 97.7 F (36.5 C)  SpO2: 100%      Lab orders placed from triage: none

## 2024-01-29 NOTE — Discharge Instructions (Signed)
 Susan Branch

## 2024-01-31 ENCOUNTER — Ambulatory Visit: Payer: Self-pay

## 2024-01-31 ENCOUNTER — Encounter (HOSPITAL_COMMUNITY): Payer: Self-pay | Admitting: Advanced Practice Midwife

## 2024-01-31 ENCOUNTER — Inpatient Hospital Stay (HOSPITAL_COMMUNITY)
Admission: AD | Admit: 2024-01-31 | Discharge: 2024-01-31 | Disposition: A | Discharge status: Home / Self Care | Attending: Student | Admitting: Student

## 2024-01-31 DIAGNOSIS — R519 Headache, unspecified: Secondary | ICD-10-CM | POA: Diagnosis present

## 2024-01-31 DIAGNOSIS — O165 Unspecified maternal hypertension, complicating the puerperium: Secondary | ICD-10-CM | POA: Insufficient documentation

## 2024-01-31 DIAGNOSIS — I10 Essential (primary) hypertension: Secondary | ICD-10-CM | POA: Insufficient documentation

## 2024-01-31 LAB — COMPREHENSIVE METABOLIC PANEL WITH GFR
ALT: 14 U/L (ref 0–44)
AST: 15 U/L (ref 15–41)
Albumin: 3.2 g/dL — ABNORMAL LOW (ref 3.5–5.0)
Alkaline Phosphatase: 87 U/L (ref 38–126)
Anion gap: 10 (ref 5–15)
BUN: 8 mg/dL (ref 6–20)
CO2: 24 mmol/L (ref 22–32)
Calcium: 9 mg/dL (ref 8.9–10.3)
Chloride: 105 mmol/L (ref 98–111)
Creatinine, Ser: 0.65 mg/dL (ref 0.44–1.00)
GFR, Estimated: >60 mL/min (ref >60)
Glucose, Bld: 91 mg/dL (ref 70–99)
Potassium: 3.7 mmol/L (ref 3.5–5.1)
Sodium: 139 mmol/L (ref 135–145)
Total Bilirubin: 0.3 mg/dL (ref 0.0–1.2)
Total Protein: 7.2 g/dL (ref 6.5–8.1)

## 2024-01-31 LAB — CBC
HCT: 34 % — ABNORMAL LOW (ref 36.0–46.0)
Hemoglobin: 11.6 g/dL — ABNORMAL LOW (ref 12.0–15.0)
MCH: 28 pg (ref 26.0–34.0)
MCHC: 34.1 g/dL (ref 30.0–36.0)
MCV: 81.9 fL (ref 80.0–100.0)
Platelets: 443 10*3/uL — ABNORMAL HIGH (ref 150–400)
RBC: 4.15 MIL/uL (ref 3.87–5.11)
RDW: 12.7 % (ref 11.5–15.5)
WBC: 11.2 10*3/uL — ABNORMAL HIGH (ref 4.0–10.5)
nRBC: 0 % (ref 0.0–0.2)

## 2024-01-31 MED ORDER — NIFEDIPINE 10 MG PO CAPS: 20.0000 mg | ORAL_CAPSULE | ORAL | Status: DC | PRN

## 2024-01-31 MED ORDER — CYCLOBENZAPRINE HCL 5 MG PO TABS: 5.0000 mg | ORAL_TABLET | Freq: Three times a day (TID) | ORAL | Status: DC | PRN

## 2024-01-31 MED ORDER — ACETAMINOPHEN-CAFFEINE 500-65 MG PO TABS
2.0000 | ORAL_TABLET | Freq: Once | ORAL | Status: AC
  Administered 2024-01-31: 2 via ORAL
  Filled 2024-01-31: qty 2

## 2024-01-31 MED ORDER — NIFEDIPINE 10 MG PO CAPS: 10.0000 mg | ORAL_CAPSULE | ORAL | Status: DC | PRN

## 2024-01-31 MED ORDER — LABETALOL HCL 5 MG/ML IV SOLN: 40.0000 mg | INTRAVENOUS | Status: DC | PRN

## 2024-01-31 MED ORDER — DIPHENHYDRAMINE HCL 25 MG PO CAPS
25.0000 mg | ORAL_CAPSULE | Freq: Once | ORAL | Status: AC
  Administered 2024-01-31: 25 mg via ORAL
  Filled 2024-01-31: qty 1

## 2024-01-31 NOTE — Discharge Instructions (Addendum)
 HEADACHE: If you get a headache at home, I recommend doing the following: 1) Drink 64 oz of water . Make sure you have something to eat. 2) Take 2 tablets Excedrin-Migraine. Alternatively, you can take Tylenol  1000 mg PLUS ibuprofen  600 mg instead. 3) If you need to you can also take Benadryl . This will likely make you very sleepy. Please take a nap as sleep can help headaches go away.   If your headache does not improve after trying these, please go to the MAU.   BLOOD PRESSURE: Your blood pressure can go up if you are in pain. If you have a headache, treat that and recheck your blood pressure after the headache improves. Your blood pressure should be under 140/90. If it is high, sit and rest for 15 minutes before checking it again. Continue taking your nifedipine  60 mg every morning. Follow up with your OB to talk about any additional adjustments they recommend.

## 2024-01-31 NOTE — MAU Note (Signed)
 Susan Branch is a 28 y.o. at Unknown here in MAU reporting: she has been having problems with her b/p since delivery C/S on 05/10. Discharged with nifedipine  and has been evaluated for her b/p and headaches x 2. Spoke with MD last pm and told to come in then but did not due to childcare. Headaches are off/on. B/p at home today 141/94.   Onset of complaint: off/on x 2 weeks Pain score: 7/10 headache Vitals:   01/31/24 1143  BP: (!) 140/89  Pulse: 98  Resp: 15  Temp: 98.5 F (36.9 C)  SpO2: 100%     FHT: na  Lab orders placed from triage: none

## 2024-01-31 NOTE — Telephone Encounter (Signed)
 Chief Complaint: hypertension Symptoms: hypertension and headache Frequency: x 1 week Pertinent Negatives: Patient denies chest pain Disposition: [x] ED /[] Urgent Care (no appt availability in office) / [] Appointment(In office/virtual)/ []  [facility]/ [] Home Care/ [] Refused Recommended Disposition /[] [facility]/ [x]  Follow-up with PCP  Additional Notes: pt states that her BP was elevated during her delivery and they sent her home with BP medications. States when she went to back to the ED on 01/29/24. States she is still having a headache. States that her BP was 152/127 and went to to 141/97. Pt is going back to ED. Scheduled her for a hospital f/u.   Copied from CRM [id number]. Topic: Clinical - Red Word Triage >> Jan 31, 2024  9:10 AM Alyse July wrote: Red Word that prompted transfer to Nurse Triage: High Blood Pressure,Headaches Reason for Disposition  Systolic BP >= 160 OR Diastolic >= 110  Protocols used: Postpartum - High Blood Pressure-A-AH

## 2024-01-31 NOTE — MAU Provider Note (Signed)
 Event Date/Time   First Provider Initiated Contact with Patient 01/31/24 1147      S [name] is a 28 y.o. [phone] pregnant/non-pregnant female at Unknown who presents to MAU today with complaint of headache and elevated blood pressure.  She was seen in the MAU on 01/29/2024 for same complaint and discharged with an increased dose of nifedipine  60 mg daily.  She has continued to experience a headache, has not taken anything for it today.  She took her nifedipine  60 mg at around 0930, blood pressures at home have been above 140/90 and her OB recommended to report to MAU.  Denies abdominal pain, chest pain.  Endorses seeing floaters.  Headache currently 7/10.  Receives care at [facility]. Prenatal records reviewed.  Pertinent items noted in HPI and remainder of comprehensive ROS otherwise negative.   O BP 134/82   Pulse 95   Temp 98.5 F (36.9 C) (Oral)   Resp 15   SpO2 100%  Physical Exam Vitals reviewed.  Constitutional:      General: She is not in acute distress.    Appearance: She is well-developed. She is not diaphoretic.  Eyes:     General: No scleral icterus. Cardiovascular:     Rate and Rhythm: Normal rate and regular rhythm.     Heart sounds: No murmur heard.    No friction rub. No gallop.  Pulmonary:     Effort: Pulmonary effort is normal. No respiratory distress.     Breath sounds: Normal breath sounds. No wheezing, rhonchi or rales.  Skin:    General: Skin is warm and dry.  Neurological:     Mental Status: She is alert.     Coordination: Coordination normal.    Results for orders placed or performed during the hospital encounter of 01/31/24 (from the past 24 hours)  Comprehensive metabolic panel     Status: Abnormal   Collection Time: 01/31/24 12:06 PM  Result Value Ref Range   Sodium 139 135 - 145 mmol/L   Potassium 3.7 3.5 - 5.1 mmol/L   Chloride 105 98 - 111 mmol/L   CO2 24 22 - 32 mmol/L   Glucose, Bld 91 70 - 99 mg/dL   BUN 8 6 - 20 mg/dL    Creatinine, Ser 8.46 0.44 - 1.00 mg/dL   Calcium  9.0 8.9 - 10.3 mg/dL   Total Protein 7.2 6.5 - 8.1 g/dL   Albumin 3.2 (L) 3.5 - 5.0 g/dL   AST 15 15 - 41 U/L   ALT 14 0 - 44 U/L   Alkaline Phosphatase 87 38 - 126 U/L   Total Bilirubin 0.3 0.0 - 1.2 mg/dL   GFR, Estimated >96 >29 mL/min   Anion gap 10 5 - 15  CBC     Status: Abnormal   Collection Time: 01/31/24 12:06 PM  Result Value Ref Range   WBC 11.2 (H) 4.0 - 10.5 K/uL   RBC 4.15 3.87 - 5.11 MIL/uL   Hemoglobin 11.6 (L) 12.0 - 15.0 g/dL   HCT 52.8 (L) 41.3 - 24.4 %   MCV 81.9 80.0 - 100.0 fL   MCH 28.0 26.0 - 34.0 pg   MCHC 34.1 30.0 - 36.0 g/dL   RDW 01.0 27.2 - 53.6 %   Platelets 443 (H) 150 - 400 K/uL   nRBC 0.0 0.0 - 0.2 %    MDM: High MAU Course: - Elevated blood pressure, otherwise vital signs within normal limits. -Cycle blood pressures for monitoring, preeclampsia labs. -  Patient currently breast-feeding.  Will take this into consideration if blood pressure medication needs to be changed, either further increase nifedipine  or add second agent. -CMP and CBC without significant abnormalities other than thrombocytosis, new since last check.  Recommend to continue to monitor outpatient to determine if this is transient reaction to resolving anemia of pregnancy versus persistent thrombocytosis. - Blood pressure improved with improved pain from headache. HA 4/10, add Benadryl .  A 1. Postpartum hypertension (Primary) - Discharge patient  2. Postpartum state - Discharge patient   Medical screening exam complete  P Discharge from MAU in stable condition with preeclampsia precautions Follow up at [facility] as scheduled for postpartum care Continue nifedipine  60 mg daily and ambulatory BP monitoring. Use Excedrin-Migraine +/- Benadryl  as needed for migraine headaches.   Future Appointments  Date Time Provider Department Center  02/14/2024 11:00 AM Zarwolo, Gloria, FNP [facility]   Allergies as of  01/31/2024       Reactions   Adhesive [tape] Rash   Latex Rash   Soap Rash        Medication List     TAKE these medications    acetaminophen  500 MG tablet Commonly known as: TYLENOL  Take 500 mg by mouth every 6 (six) hours as needed.   cyclobenzaprine  5 MG tablet Commonly known as: FLEXERIL  Take 1-2 tablets (5-10 mg total) by mouth 3 (three) times daily as needed for muscle spasms.   ibuprofen  600 MG tablet Commonly known as: ADVIL  Take 1 tablet (600 mg total) by mouth every 6 (six) hours as needed for cramping.   Iron  (Ferrous Sulfate ) 325 (65 Fe) MG Tabs Take 325 mg by mouth every morning.   NIFEdipine  30 MG 24 hr tablet Commonly known as: ADALAT  CC Take 2 tablets (60 mg total) by mouth daily.   oxyCODONE  5 MG immediate release tablet Commonly known as: Oxy IR/ROXICODONE  Take 1 tablet (5 mg total) by mouth every 6 (six) hours as needed for moderate pain (pain score 4-6).   PRENATAL PO Take 1 tablet by mouth daily.        Noreene Bearded, PA

## 2024-02-04 ENCOUNTER — Telehealth (HOSPITAL_COMMUNITY): Payer: Self-pay | Admitting: *Deleted

## 2024-02-04 NOTE — Telephone Encounter (Signed)
 Patient advised to go to ED .

## 2024-02-04 NOTE — Telephone Encounter (Signed)
 02/04/2024  Name: Susan Branch MRN: [id number] DOB: 21-May-1996  Reason for Call:  Transition of Care Hospital Discharge Call  Contact Status: Patient Contact Status: Complete Patient requested return call later this evening to complete EPDS. Language assistant needed:          Follow-Up Questions: Do You Have Any Concerns About Your Health As You Heal From Delivery?: No Do You Have Any Concerns About Your Infants Health?: No  Edinburgh Postnatal Depression Scale:  In the Past 7 Days:    PHQ2-9 Depression Scale:     Discharge Follow-up:    Post-discharge interventions: Reviewed Newborn Safe Sleep Practices  Signature Julien Odor, RN, 02/04/24, [phone]

## 2024-02-04 NOTE — Telephone Encounter (Signed)
 02/04/2024  Name: Susan Branch MRN: [id number] DOB: 1996-07-15  Reason for Call:  Transition of Care Hospital Discharge Call  Contact Status: Patient Contact Status: Complete Return call placed to patient. Language assistant needed:          Follow-Up Questions:    [name] Postnatal Depression Scale:  In the Past 7 Days: I have been able to laugh and see the funny side of things.: As much as I always could I have looked forward with enjoyment to things.: As much as I ever did I have blamed myself unnecessarily when things went wrong.: No, never I have been anxious or worried for no good reason.: Yes, sometimes I have felt scared or panicky for no good reason.: No, not at all Things have been getting on top of me.: Yes, sometimes I haven't been coping as well as usual I have been so unhappy that I have had difficulty sleeping.: Not at all I have felt sad or miserable.: No, not at all I have been so unhappy that I have been crying.: No, never The thought of harming myself has occurred to me.: Never Edinburgh Postnatal Depression Scale Total: 4  PHQ2-9 Depression Scale:     Discharge Follow-up: Edinburgh score requires follow up?: No Patient was advised of the following resources:: Breastfeeding Support Group, Support Group  Post-discharge interventions: Reviewed Newborn Safe Sleep Practices  Signature Julien Odor, RN, 02/04/24, [phone]

## 2024-02-14 ENCOUNTER — Inpatient Hospital Stay: Payer: Self-pay | Admitting: Family Medicine

## 2024-02-14 ENCOUNTER — Ambulatory Visit: Admitting: Nurse Practitioner

## 2024-02-28 ENCOUNTER — Inpatient Hospital Stay: Payer: Self-pay | Admitting: Family Medicine

## 2024-02-28 DIAGNOSIS — Z1389 Encounter for screening for other disorder: Secondary | ICD-10-CM | POA: Diagnosis not present

## 2024-02-28 DIAGNOSIS — O24419 Gestational diabetes mellitus in pregnancy, unspecified control: Secondary | ICD-10-CM | POA: Diagnosis not present

## 2024-02-28 DIAGNOSIS — Z30430 Encounter for insertion of intrauterine contraceptive device: Secondary | ICD-10-CM | POA: Diagnosis not present

## 2024-02-28 DIAGNOSIS — Z3009 Encounter for other general counseling and advice on contraception: Secondary | ICD-10-CM | POA: Diagnosis not present

## 2024-02-28 DIAGNOSIS — O139 Gestational [pregnancy-induced] hypertension without significant proteinuria, unspecified trimester: Secondary | ICD-10-CM | POA: Diagnosis not present

## 2024-04-09 DIAGNOSIS — N898 Other specified noninflammatory disorders of vagina: Secondary | ICD-10-CM | POA: Diagnosis not present

## 2024-04-09 DIAGNOSIS — N760 Acute vaginitis: Secondary | ICD-10-CM | POA: Diagnosis not present

## 2024-04-09 DIAGNOSIS — B9689 Other specified bacterial agents as the cause of diseases classified elsewhere: Secondary | ICD-10-CM | POA: Diagnosis not present

## 2024-04-09 DIAGNOSIS — Z30431 Encounter for routine checking of intrauterine contraceptive device: Secondary | ICD-10-CM | POA: Diagnosis not present

## 2024-04-09 DIAGNOSIS — B3731 Acute candidiasis of vulva and vagina: Secondary | ICD-10-CM | POA: Diagnosis not present

## 2024-04-16 ENCOUNTER — Encounter: Payer: Self-pay | Admitting: Family Medicine

## 2024-04-16 ENCOUNTER — Other Ambulatory Visit: Payer: Self-pay | Admitting: Family Medicine

## 2024-04-16 ENCOUNTER — Ambulatory Visit: Admitting: Family Medicine

## 2024-04-16 VITALS — BP 132/82 | HR 97 | Ht 64.0 in | Wt 256.0 lb

## 2024-04-16 DIAGNOSIS — M545 Low back pain, unspecified: Secondary | ICD-10-CM | POA: Diagnosis not present

## 2024-04-16 DIAGNOSIS — E038 Other specified hypothyroidism: Secondary | ICD-10-CM

## 2024-04-16 DIAGNOSIS — F411 Generalized anxiety disorder: Secondary | ICD-10-CM

## 2024-04-16 DIAGNOSIS — E7849 Other hyperlipidemia: Secondary | ICD-10-CM | POA: Diagnosis not present

## 2024-04-16 DIAGNOSIS — I10 Essential (primary) hypertension: Secondary | ICD-10-CM | POA: Diagnosis not present

## 2024-04-16 DIAGNOSIS — E559 Vitamin D deficiency, unspecified: Secondary | ICD-10-CM

## 2024-04-16 DIAGNOSIS — R7301 Impaired fasting glucose: Secondary | ICD-10-CM | POA: Diagnosis not present

## 2024-04-16 MED ORDER — CYCLOBENZAPRINE HCL 5 MG PO TABS
5.0000 mg | ORAL_TABLET | Freq: Three times a day (TID) | ORAL | 1 refills | Status: AC | PRN
Start: 2024-04-16 — End: ?

## 2024-04-16 MED ORDER — NAPROXEN 500 MG PO TABS: 500.0000 mg | ORAL_TABLET | Freq: Two times a day (BID) | ORAL | 1 refills | Status: DC | PRN

## 2024-04-16 MED ORDER — SERTRALINE HCL 25 MG PO TABS: 25.0000 mg | ORAL_TABLET | Freq: Every day | ORAL | 3 refills | Status: DC

## 2024-04-16 NOTE — Assessment & Plan Note (Signed)
 Denies suicidal or homicidal ideation (SI/HI) and auditory or visual hallucinations (AVH). The patient reports that her anxiety has worsened since giving birth. Her GAD-7 score is 14, indicating moderate anxiety. She will resume pharmacologic treatment with sertraline  (Zoloft ) 25 mg daily. She was encouraged to promptly report any worsening mood symptoms or emergence of suicidal thoughts.

## 2024-04-16 NOTE — Assessment & Plan Note (Signed)
 The patient reports intermittent low back pain that has been ongoing for the past few weeks. She denies any red flag symptoms such as bowel or bladder dysfunction, lower extremity weakness, or numbness. Treatment will be initiated with naproxen  500 mg twice daily as needed for pain and cyclobenzaprine  (Flexeril ) 5 mg up to three times daily as needed for muscle spasms. She was encouraged to apply heat or ice to the lower back as needed, engage in gentle stretching exercises, and avoid prolonged bed rest. The patient was advised to follow up if her symptoms worsen or do not improve.

## 2024-04-16 NOTE — Assessment & Plan Note (Addendum)
 The patient's blood pressure is well controlled today in the clinic. She reports a history of preeclampsia during the third trimester of her pregnancy; however, her blood pressure has remained well controlled since delivery, and she is no longer taking Nifedipine  30 mg daily. She was encouraged to maintain a low-sodium diet and increase physical activity as part of her postpartum care. Blood pressure will continue to be monitored during the postpartum period to ensure ongoing stability.

## 2024-04-16 NOTE — Progress Notes (Signed)
 Established Patient Office Visit  Subjective:  Patient ID: Susan Branch, female    DOB: 1995-12-16  Age: 28 y.o. MRN: [id number]  CC:  Chief Complaint  Patient presents with   Medical Management of Chronic Issues    Newly diagnosed with HTN after childbirth    HPI Susan Branch is a 28 y.o. female with past medical history of  GAD presents for f/u of  chronic medical conditions.  For the details of today's visit, please refer to the assessment and plan.     Past Medical History:  Diagnosis Date   Allergy    Anxiety    Borderline hypertension    no current med.   Gestational diabetes    Headache    Tonsillar and adenoid hypertrophy 05/2013   snores during sleep, mother denies apnea    Past Surgical History:  Procedure Laterality Date   CESAREAN SECTION N/A 08/15/2022   Procedure: CESAREAN SECTION;  Surgeon: Delana Ted Morrison, DO;  Location: [facility];  Service: Obstetrics;  Laterality: N/A;   CESAREAN SECTION N/A 01/18/2024   Procedure: CESAREAN DELIVERY;  Surgeon: Delana Ted Morrison, DO;  Location: [facility];  Service: Obstetrics;  Laterality: N/A;   CHOLECYSTECTOMY N/A 12/30/2022   Procedure: LAPAROSCOPIC CHOLECYSTECTOMY;  Surgeon: Aron Shoulders, MD;  Location: [facility] OR;  Service: General;  Laterality: N/A;   TONSILLECTOMY AND ADENOIDECTOMY N/A 06/02/2013   Procedure: TONSILLECTOMY AND ADENOIDECTOMY;  Surgeon: Ana LELON Moccasin, MD;  Location: [facility];  Service: ENT;  Laterality: N/A;   TURBINATE REDUCTION Bilateral 06/02/2013   Procedure: TURBINATE REDUCTION BILATERAL;  Surgeon: Ana LELON Moccasin, MD;  Location: [facility];  Service: ENT;  Laterality: Bilateral;   WISDOM TOOTH EXTRACTION     all four 3/24    Family History  Problem Relation Age of Onset   Diabetes Mother    Depression Father    Diabetes Father    Hypertension Father    Heart disease Father        MI   Hyperlipidemia Father    Kidney disease Father    Vision loss Father     Bipolar disorder Father    Vision loss Maternal Grandmother    Stroke Maternal Grandmother    Diabetes Paternal Grandmother    Kidney failure Paternal Grandmother    Diabetes Paternal Grandfather    Kidney failure Paternal Grandfather    Muscular dystrophy Paternal Grandfather     Social History   Socioeconomic History   Marital status: Single    Spouse name: Not on file   Number of children: Not on file   Years of education: Not on file   Highest education level: Not on file  Occupational History   Not on file  Tobacco Use   Smoking status: Never    Passive exposure: Yes   Smokeless tobacco: Never  Vaping Use   Vaping status: Never Used  Substance and Sexual Activity   Alcohol use: No   Drug use: Not Currently    Frequency: 4.0 times per week    Types: Marijuana   Sexual activity: Yes    Birth control/protection: Implant  Other Topics Concern   Not on file  Social History Narrative   Not on file   Social Drivers of Health   Financial Resource Strain: Low Risk  (02/14/2022)   Overall Financial Resource Strain (CARDIA)    Difficulty of Paying Living Expenses: Not very hard  Food Insecurity: No Food Insecurity (  01/18/2024)   Hunger Vital Sign    Worried About Running Out of Food in the Last Year: Never true    Ran Out of Food in the Last Year: Never true  Transportation Needs: No Transportation Needs (01/18/2024)   PRAPARE - Administrator, Civil Service (Medical): No    Lack of Transportation (Non-Medical): No  Physical Activity: Sufficiently Active (02/14/2022)   Exercise Vital Sign    Days of Exercise per Week: 7 days    Minutes of Exercise per Session: 70 min  Stress: No Stress Concern Present (02/14/2022)   [employer] of Occupational Health - Occupational Stress Questionnaire    Feeling of Stress : Only a little  Social Connections: Moderately Integrated (01/18/2024)   Social Connection and Isolation Panel    Frequency of Communication with  Friends and Family: More than three times a week    Frequency of Social Gatherings with Friends and Family: More than three times a week    Attends Religious Services: More than 4 times per year    Active Member of [employer] or Organizations: Yes    Attends Banker Meetings: More than 4 times per year    Marital Status: Never married  Intimate Partner Violence: Patient Unable To Answer (01/18/2024)   Humiliation, Afraid, Rape, and Kick questionnaire    Fear of Current or Ex-Partner: Patient unable to answer    Emotionally Abused: Patient unable to answer    Physically Abused: Patient unable to answer    Sexually Abused: Patient unable to answer    Outpatient Medications Prior to Visit  Medication Sig Dispense Refill   acetaminophen  (TYLENOL ) 500 MG tablet Take 500 mg by mouth every 6 (six) hours as needed.     NIFEdipine  (ADALAT  CC) 30 MG 24 hr tablet Take 2 tablets (60 mg total) by mouth daily. 60 tablet 1   oxyCODONE  (OXY IR/ROXICODONE ) 5 MG immediate release tablet Take 1 tablet (5 mg total) by mouth every 6 (six) hours as needed for moderate pain (pain score 4-6). 20 tablet 0   Prenatal Vit-Fe Fumarate-FA (PRENATAL PO) Take 1 tablet by mouth daily.     cyclobenzaprine  (FLEXERIL ) 5 MG tablet Take 1-2 tablets (5-10 mg total) by mouth 3 (three) times daily as needed for muscle spasms. 30 tablet 0   ibuprofen  (ADVIL ) 600 MG tablet Take 1 tablet (600 mg total) by mouth every 6 (six) hours as needed for cramping. 30 tablet 0   Iron , Ferrous Sulfate , 325 (65 Fe) MG TABS Take 325 mg by mouth every morning. (Patient not taking: Reported on 04/16/2024) 90 tablet 3   No facility-administered medications prior to visit.    Allergies  Allergen Reactions   Adhesive [Tape] Rash   Latex Rash   Soap Rash    ROS Review of Systems  Constitutional:  Negative for chills and fever.  Eyes:  Negative for visual disturbance.  Respiratory:  Negative for chest tightness and shortness of breath.    Musculoskeletal:  Positive for back pain.  Neurological:  Negative for dizziness and headaches.      Objective:    Physical Exam HENT:     Head: Normocephalic.     Mouth/Throat:     Mouth: Mucous membranes are moist.  Cardiovascular:     Rate and Rhythm: Normal rate.     Heart sounds: Normal heart sounds.  Pulmonary:     Effort: Pulmonary effort is normal.     Breath sounds: Normal breath sounds.  Musculoskeletal:     Lumbar back: Tenderness present.  Neurological:     Mental Status: She is alert.     BP 132/82   Pulse 97   Ht 5' 4 (1.626 m)   Wt 256 lb (116.1 kg)   SpO2 95%   Breastfeeding No   BMI 43.94 kg/m  Wt Readings from Last 3 Encounters:  04/16/24 256 lb (116.1 kg)  01/29/24 236 lb (107 kg)  01/18/24 265 lb 9.6 oz (120.5 kg)    Lab Results  Component Value Date   TSH 0.71 05/04/2020   Lab Results  Component Value Date   WBC 11.2 (H) 01/31/2024   HGB 11.6 (L) 01/31/2024   HCT 34.0 (L) 01/31/2024   MCV 81.9 01/31/2024   PLT 443 (H) 01/31/2024   Lab Results  Component Value Date   NA 139 01/31/2024   K 3.7 01/31/2024   CO2 24 01/31/2024   GLUCOSE 91 01/31/2024   BUN 8 01/31/2024   CREATININE 0.65 01/31/2024   BILITOT 0.3 01/31/2024   ALKPHOS 87 01/31/2024   AST 15 01/31/2024   ALT 14 01/31/2024   PROT 7.2 01/31/2024   ALBUMIN 3.2 (L) 01/31/2024   CALCIUM  9.0 01/31/2024   ANIONGAP 10 01/31/2024   EGFR 131 02/14/2022   Lab Results  Component Value Date   CHOL 190 05/04/2020   Lab Results  Component Value Date   HDL 46 (L) 05/04/2020   Lab Results  Component Value Date   LDLCALC 127 (H) 05/04/2020   Lab Results  Component Value Date   TRIG 78 05/04/2020   Lab Results  Component Value Date   CHOLHDL 4.1 05/04/2020   Lab Results  Component Value Date   HGBA1C 5.6 02/14/2022      Assessment & Plan:  GAD (generalized anxiety disorder) Assessment & Plan: Denies suicidal or homicidal ideation (SI/HI) and auditory or  visual hallucinations (AVH). The patient reports that her anxiety has worsened since giving birth. Her GAD-7 score is 14, indicating moderate anxiety. She will resume pharmacologic treatment with sertraline  (Zoloft ) 25 mg daily. She was encouraged to promptly report any worsening mood symptoms or emergence of suicidal thoughts.     Orders: -     Sertraline  HCl; Take 1 tablet (25 mg total) by mouth daily.  Dispense: 30 tablet; Refill: 3  Elevated blood pressure reading with diagnosis of hypertension Assessment & Plan: The patient's blood pressure is well controlled today in the clinic. She reports a history of preeclampsia during the third trimester of her pregnancy; however, her blood pressure has remained well controlled since delivery, and she is no longer taking Nifedipine  30 mg daily. She was encouraged to maintain a low-sodium diet and increase physical activity as part of her postpartum care. Blood pressure will continue to be monitored during the postpartum period to ensure ongoing stability.     Acute midline low back pain without sciatica Assessment & Plan: The patient reports intermittent low back pain that has been ongoing for the past few weeks. She denies any red flag symptoms such as bowel or bladder dysfunction, lower extremity weakness, or numbness. Treatment will be initiated with naproxen  500 mg twice daily as needed for pain and cyclobenzaprine  (Flexeril ) 5 mg up to three times daily as needed for muscle spasms. She was encouraged to apply heat or ice to the lower back as needed, engage in gentle stretching exercises, and avoid prolonged bed rest. The patient was advised to follow up if her symptoms worsen  or do not improve.   Orders: -     Naproxen ; Take 1 tablet (500 mg total) by mouth 2 (two) times daily as needed for moderate pain (pain score 4-6).  Dispense: 60 tablet; Refill: 1 -     Cyclobenzaprine  HCl; Take 1-2 tablets (5-10 mg total) by mouth 3 (three) times daily  as needed for muscle spasms.  Dispense: 30 tablet; Refill: 1  IFG (impaired fasting glucose) -     Hemoglobin A1c  Vitamin D deficiency -     VITAMIN D 25 Hydroxy (Vit-D Deficiency, Fractures)  TSH (thyroid-stimulating hormone deficiency) -     TSH + free T4  Other hyperlipidemia -     Lipid panel  Note: This chart has been completed using Engineer, civil (consulting) software, and while attempts have been made to ensure accuracy, certain words and phrases may not be transcribed as intended.    Follow-up: Return in about 6 months (around 10/17/2024).   Marirose Deveney, FNP

## 2024-04-16 NOTE — Patient Instructions (Addendum)
 I appreciate the opportunity to provide care to you today!    Follow up:  6 months  Labs: please stop by the lab during the week to get your blood drawn (TSH, Lipid profile, HgA1c, Vit D)  For a Healthier YOU, I Recommend: Reducing your intake of sugar, sodium, carbohydrates, and saturated fats. Increasing your fiber intake by incorporating more whole grains, fruits, and vegetables into your meals. Setting healthy goals with a focus on lowering your consumption of carbs, sugar, and unhealthy fats. Adding variety to your diet by including a wide range of fruits and vegetables. Cutting back on soda and limiting processed foods as much as possible. Staying active: In addition to taking your weight loss medication, aim for at least 150 minutes of moderate-intensity physical activity each week for optimal results.  Anxiety -start taking Zoloft  25 mg daily  Low Back Pain -Start Naproxen  500 mg twice daily (BID) as needed for low back pain. -Start Flexeril  (Cyclobenzaprine ) 5 mg as needed up to three times daily for muscle spasm relief. -Apply heat or ice to the lower back as needed. -Encourage gentle stretching and avoid prolonged bed rest. -Please follow up if your symptoms worsen or fail to improve.     Please continue to a heart-healthy diet and increase your physical activities. Try to exercise for at least five days a week.    It was a pleasure to see you and I look forward to continuing to work together on your health and well-being. Please do not hesitate to call the office if you need care or have questions about your care.  In case of emergency, please visit the Emergency Department for urgent care, or contact our clinic at [phone] to schedule an appointment. We're here to help you!   Have a wonderful day and week. With Gratitude, Efosa Treichler MSN, FNP-BC

## 2024-04-17 DIAGNOSIS — R7301 Impaired fasting glucose: Secondary | ICD-10-CM | POA: Diagnosis not present

## 2024-04-17 DIAGNOSIS — E038 Other specified hypothyroidism: Secondary | ICD-10-CM | POA: Diagnosis not present

## 2024-04-17 DIAGNOSIS — E7849 Other hyperlipidemia: Secondary | ICD-10-CM | POA: Diagnosis not present

## 2024-04-17 DIAGNOSIS — E559 Vitamin D deficiency, unspecified: Secondary | ICD-10-CM | POA: Diagnosis not present

## 2024-04-18 LAB — HEMOGLOBIN A1C
Est. average glucose Bld gHb Est-mCnc: 114 mg/dL
Hgb A1c MFr Bld: 5.6 % (ref 4.8–5.6)

## 2024-04-18 LAB — TSH+FREE T4
Free T4: 1.01 ng/dL (ref 0.82–1.77)
TSH: 0.915 u[IU]/mL (ref 0.450–4.500)

## 2024-04-18 LAB — LIPID PANEL
Chol/HDL Ratio: 3.5 ratio (ref 0.0–4.4)
Cholesterol, Total: 196 mg/dL (ref 100–199)
HDL: 56 mg/dL (ref >39)
LDL Chol Calc (NIH): 122 mg/dL — ABNORMAL HIGH (ref 0–99)
Triglycerides: 102 mg/dL (ref 0–149)
VLDL Cholesterol Cal: 18 mg/dL (ref 5–40)

## 2024-04-18 LAB — VITAMIN D 25 HYDROXY (VIT D DEFICIENCY, FRACTURES): Vit D, 25-Hydroxy: 24.6 ng/mL — ABNORMAL LOW (ref 30.0–100.0)

## 2024-04-28 ENCOUNTER — Ambulatory Visit: Payer: Self-pay | Admitting: Family Medicine

## 2024-04-28 DIAGNOSIS — E559 Vitamin D deficiency, unspecified: Secondary | ICD-10-CM

## 2024-04-28 MED ORDER — VITAMIN D (ERGOCALCIFEROL) 1.25 MG (50000 UNIT) PO CAPS: 50000.0000 [IU] | ORAL_CAPSULE | ORAL | 1 refills | Status: AC

## 2024-04-30 ENCOUNTER — Ambulatory Visit: Attending: Obstetrics and Gynecology

## 2024-04-30 ENCOUNTER — Other Ambulatory Visit: Payer: Self-pay

## 2024-04-30 DIAGNOSIS — R252 Cramp and spasm: Secondary | ICD-10-CM | POA: Diagnosis not present

## 2024-04-30 DIAGNOSIS — M5459 Other low back pain: Secondary | ICD-10-CM | POA: Diagnosis not present

## 2024-04-30 DIAGNOSIS — R262 Difficulty in walking, not elsewhere classified: Secondary | ICD-10-CM | POA: Insufficient documentation

## 2024-04-30 DIAGNOSIS — M6281 Muscle weakness (generalized): Secondary | ICD-10-CM | POA: Diagnosis not present

## 2024-04-30 DIAGNOSIS — R293 Abnormal posture: Secondary | ICD-10-CM | POA: Insufficient documentation

## 2024-04-30 NOTE — Therapy (Signed)
 OUTPATIENT PHYSICAL THERAPY THORACOLUMBAR EVALUATION   Patient Name: Susan Branch MRN: [id number] DOB:June 10, 1996, 28 y.o., female Today's Date: 04/30/2024  END OF SESSION:  PT End of Session - 04/30/24 1632     Visit Number 1    Date for PT Re-Evaluation 06/25/24    Authorization Type [place] [employer]    PT Start Time 1620    PT Stop Time 1655    PT Time Calculation (min) 35 min          Past Medical History:  Diagnosis Date   Allergy    Anxiety    Borderline hypertension    no current med.   Gestational diabetes    Headache    Tonsillar and adenoid hypertrophy 05/2013   snores during sleep, mother denies apnea   Past Surgical History:  Procedure Laterality Date   CESAREAN SECTION N/A 08/15/2022   Procedure: CESAREAN SECTION;  Surgeon: Delana Ted Morrison, DO;  Location: [facility];  Service: Obstetrics;  Laterality: N/A;   CESAREAN SECTION N/A 01/18/2024   Procedure: CESAREAN DELIVERY;  Surgeon: Delana Ted Morrison, DO;  Location: [facility];  Service: Obstetrics;  Laterality: N/A;   CHOLECYSTECTOMY N/A 12/30/2022   Procedure: LAPAROSCOPIC CHOLECYSTECTOMY;  Surgeon: Aron Shoulders, MD;  Location: [facility];  Service: General;  Laterality: N/A;   TONSILLECTOMY AND ADENOIDECTOMY N/A 06/02/2013   Procedure: TONSILLECTOMY AND ADENOIDECTOMY;  Surgeon: Ana LELON Moccasin, MD;  Location: [facility];  Service: ENT;  Laterality: N/A;   TURBINATE REDUCTION Bilateral 06/02/2013   Procedure: TURBINATE REDUCTION BILATERAL;  Surgeon: Ana LELON Moccasin, MD;  Location: [facility];  Service: ENT;  Laterality: Bilateral;   WISDOM TOOTH EXTRACTION     all four 3/24   Patient Active Problem List   Diagnosis Date Noted   Acute midline low back pain without sciatica 04/16/2024   Previous cesarean delivery affecting pregnancy 01/18/2024   Encounter for induction of labor 08/15/2022   Carrier of spinal muscular atrophy 02/26/2022   Elevated blood pressure reading with  diagnosis of hypertension 02/16/2022   GBS bacteriuria 02/16/2022   Supervision of normal first pregnancy 02/13/2022   GAD (generalized anxiety disorder) 10/01/2018   Depression, major, single episode, moderate (HCC) 09/25/2017   Obesity 04/22/2014   Acne vulgaris 04/22/2014   Migraine headache 04/22/2014    PCP: Zarwolo, Gloria, FNP  REFERRING PROVIDER: Delana Ted Morrison, DO  REFERRING DIAG: M54.50 (ICD-10-CM) - Low back pain, unspecified  Rationale for Evaluation and Treatment: Rehabilitation  THERAPY DIAG:  Other low back pain - Plan: PT plan of care cert/re-cert  Muscle weakness (generalized) - Plan: PT plan of care cert/re-cert  Difficulty in walking, not elsewhere classified - Plan: PT plan of care cert/re-cert  Cramp and spasm - Plan: PT plan of care cert/re-cert  Abnormal posture - Plan: PT plan of care cert/re-cert  ONSET DATE: 04/29/2024  SUBJECTIVE:  SUBJECTIVE STATEMENT: Patient reports back pain since the beginning of her first pregnancy around 2 years ago.  This pain worsened after her 2nd c-section.  She reports this is worse with standing or bending fwd to care for her children.  She rates her pain at 2/10 with sitting still and as much as 9/10 with activity.  She describes moments of very sharp pain that take her breath.    PERTINENT HISTORY:  2 pregnancies and both C sections in the past 2 years.   PAIN:  Are you having pain? Yes: NPRS scale: 2/10 sitting still now, but up to 9/10 in the past 24 hours Pain location: lumbar Pain description: sharp at times but generally aching Aggravating factors: standing bending fwd Relieving factors: rest, heat  PRECAUTIONS: None  RED FLAGS: None   WEIGHT BEARING RESTRICTIONS: No  FALLS:  Has patient fallen in last 6  months? 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  DIAGNOSTIC FINDINGS:  none  PATIENT SURVEYS:  Modified Oswestry:  MODIFIED OSWESTRY DISABILITY SCALE  Date: 04/30/24 Score  Pain intensity 4 =  Pain medication provides me with little relief from pain.  2. Personal care (washing, dressing, etc.) 1 =  I can take care of myself normally, but it increases my pain.  3. Lifting 4 = I can lift only very light weights  4. Walking 2 =  Pain prevents me from walking more than  mile.  5. Sitting 1 =  I can only sit in my favorite chair as long as I like.  6. Standing 4 =  Pain prevents me from standing more than 10 minutes.  7. Sleeping 2 =  Even when I take pain medication, I sleep less than 6 hours  8. Social Life 2 = Pain prevents me from participating in more energetic activities (eg. sports, dancing).  9. Traveling 1 =  I can travel anywhere, but it increases my pain.  10. Employment/ Homemaking 1 = My normal homemaking/job activities increase my pain, but I can still perform all that is required of me  Total 22/50   Interpretation of scores: Score Category Description  0-20% Minimal Disability The patient can cope with most living activities. Usually no treatment is indicated apart from advice on lifting, sitting and exercise  21-40% Moderate Disability The patient experiences more pain and difficulty with sitting, lifting and standing. Travel and social life  are more difficult and they may be disabled from work. Personal care, sexual activity and sleeping are not grossly affected, and the patient can usually be managed by conservative means  41-60% Severe Disability Pain remains the main problem in this group, but activities of daily living are affected. These patients require a detailed investigation  61-80% Crippled Back pain impinges on all aspects of the patient's life. Positive intervention is required  81-100% Bed-bound  These patients are either bed-bound or exaggerating their symptoms  Bluford FORBES Zoe DELENA Karon DELENA, et al. Surgery versus conservative management of stable thoracolumbar fracture: the PRESTO feasibility RCT. [place] ([place]):  NIHR Journals Library; 2021 Nov. (Health Technology Assessment, No. 25.62.) Appendix 3, Oswestry Disability Index category descriptors. Available from: FindJewel[employer].cz  Minimally Clinically Important Difference (MCID) = 12.8%  COGNITION: Overall cognitive status: Within functional limits for tasks assessed     SENSATION: WFL  MUSCLE LENGTH: Hamstrings: Right 45 deg; Left 45 deg Thomas test: Right pos; Left pos  POSTURE: rounded shoulders and decreased lumbar lordosis   LUMBAR ROM:   AROM eval  Flexion WNL  Extension WNL  Right lateral flexion WNL with pain  Left lateral flexion WNL with pain  Right rotation WNL with pain  Left rotation WNL with pain   (Blank rows = not tested)  LOWER EXTREMITY ROM:     WNL  LOWER EXTREMITY MMT:    Generally 4/5 bilateral LE's with exception of bilateral hip ext 4-/5  LUMBAR SPECIAL TESTS:  Straight leg raise test: Negative  FUNCTIONAL TESTS:  5 times sit to stand: complete next visit Timed up and go (TUG): complete next visit  GAIT: Distance walked: 50 feet Assistive device utilized: None Level of assistance: Complete Independence Comments: Normal heel to toe progression but antalgic start up  TREATMENT DATE:  04/30/24 Initiated HEP and complete initial eval                                                                                                                                 PATIENT EDUCATION:  Education details: Initiated HEP and educated on proper posture and body mechanics for safe lifting and caring for her newborn and 20 month old.   Person educated: Patient Education method: Explanation, Demonstration, Verbal cues, and Handouts Education comprehension: verbalized understanding, returned demonstration, and verbal cues required  HOME EXERCISE PROGRAM: Access Code: [email or web address] URL: [email or web address] Date: 04/30/2024 Prepared by: Delon Haddock  Exercises - Standing Hamstring Stretch on Chair  - 1 x daily - 7 x weekly - 1 sets - 3 reps - 30 sec hold - Quadricep Stretch with Chair and Counter Support  - 1 x daily - 7 x weekly - 1 sets - 3 reps - 30 sec hold - Seated Figure 4 Piriformis Stretch  - 1 x daily - 7 x weekly - 1 sets - 3 reps - 30 sed hold  ASSESSMENT:  CLINICAL IMPRESSION: Patient is a 28 y.o. female who was seen today for physical therapy evaluation and treatment for low back pain.  She presents with extremely tight hamstrings bilaterally but full trunk flexion.  She has pain with side bending and diffuse pain across the low back with her usual daily activities, dressing, bathing, and caring for her 2 kids.  She reports moments of sharp pain that are as much as 9/10.  She would benefit from skilled PT for LE flexibilty, core strengthening, posture and body mechanics instruction and pain control.    OBJECTIVE IMPAIRMENTS: decreased knowledge of condition, difficulty walking, decreased ROM, decreased strength, increased fascial restrictions, increased muscle spasms, impaired  flexibility, improper body mechanics, postural dysfunction, obesity, and pain.   ACTIVITY LIMITATIONS: carrying, lifting, bending, standing, squatting, sleeping, stairs, transfers, bed  mobility, bathing, toileting, dressing, hygiene/grooming, and caring for others  PARTICIPATION LIMITATIONS: meal prep, cleaning, laundry, shopping, and community activity  PERSONAL FACTORS: Fitness and 1-2 comorbidities: diabetes, obesity are also affecting patient's functional outcome.   REHAB POTENTIAL: Good  CLINICAL DECISION MAKING: Stable/uncomplicated  EVALUATION COMPLEXITY: Low   GOALS: Goals reviewed with patient? Yes  SHORT TERM GOALS: Target date: 05/28/2024  Pain report to be no greater than 4/10  Baseline: Goal status: INITIAL  2.  Patient will be independent with initial HEP  Baseline:  Goal status: INITIAL   LONG TERM GOALS: Target date: 06/25/2024  Patient to report pain no greater than 2/10  Baseline:  Goal status: INITIAL  2.  Patient to be independent with advanced HEP  Baseline:  Goal status: INITIAL  3.  Patient to be able to stand or walk for at least 15 min without back and hip pain to be able to shower and stand to change her childrens diapers Baseline:  Goal status: INITIAL  4.  Patient to be able to bend, stoop and squat with pain no greater than 2/10 to be able to dress and bathe her children and do normal household duties Baseline:  Goal status: INITIAL  5.  ODI to be 12 or less (less than 24%) Baseline:  Goal status: INITIAL  6.  Patient to report 85% improvement in overall symptoms Baseline:  Goal status: INITIAL  PLAN:  PT FREQUENCY: 1-2x/week  PT DURATION: 8 weeks  PLANNED INTERVENTIONS: 97110-Therapeutic exercises, 97530- Therapeutic activity, [id number]- Neuromuscular re-education, 97535- Self Care, [id number]- Manual therapy, [phone]- Gait training, [phone]- Aquatic Therapy, [phone]- Electrical stimulation (unattended), [phone]- Electrical stimulation (manual), [id number]- Ultrasound, [id number]- Traction (mechanical), [id number]- Ionotophoresis 4mg /ml Dexamethasone , Patient/Family education, Balance training, Stair training, Taping, Joint mobilization,  Spinal mobilization, Cryotherapy, and Moist heat.  PLAN FOR NEXT SESSION: Complete functional tests: (5 TSTS and TUG), Nustep, review HEP, initiate core strengthening exercises.   Delon B. Leisl Spurrier, PT 04/30/24 5:26 PM [facility] [address], [address] [place], [place] [address] Phone # [phone] Fax [phone]

## 2024-05-01 ENCOUNTER — Ambulatory Visit: Payer: Self-pay | Admitting: *Deleted

## 2024-05-01 NOTE — Telephone Encounter (Signed)
 Copied from CRM #[id number]. Topic: Clinical - Red Word Triage >> May 01, 2024  3:23 PM Carlatta H wrote: Red Word that prompted transfer to Nurse Triage: Severe sharp back pain with numbness//Mid back and right side Reason for Disposition  [1] MODERATE back pain (e.g., interferes with normal activities) AND [2] present > 3 days    Seen for this on 04/16/2024 and given medication.   Not helping much.  Answer Assessment - Initial Assessment Questions 1. ONSET: When did the pain begin? (e.g., minutes, hours, days)     I'm having lower back pain.    Started a month ago.   I started a new birth control.   They think I have a pinched nerve.    I started a new medicine for the infection from my OB dr.     [place] told me to call back if I wasn't any better.   so I thought it was that.   My OB has set up for me to be in physical therapy.  I had my first PT session yesterday.    I didn't know what I needed to do.   I saw Gloria Zarwolo, FNP on Aug. 7th.  I can't cook or grocery shop due to the pain.   I had my baby in May.    It never got better it's actually gotten worse my lower back pain. 2. LOCATION: Where does it hurt? (upper, mid or lower back)     Lower back. When it's severe it goes into my stomach.   It goes around into my stomach but it's not my stomach hurting. 3. SEVERITY: How bad is the pain?  (e.g., Scale 1-10; mild, moderate, or severe)     Severe at times 4. PATTERN: Is the pain constant? (e.g., yes, no; constant, intermittent)      Constant She gave me a muscle relaxer and Naproxen  and it's not helping.   5. RADIATION: Does the pain shoot into your legs or somewhere else?     Goes around in to her stomach.  I finished all the medicine a couple days ago.   My OB prescribed this for me for my yeast infection.     My first first  PT was yesterday.    6. CAUSE:  What do you think is causing the back pain?      I have a pinched nerve and bulging disc.    Can you send Gloria  Zarwolo. FNP a message that my lower back is still hurting and what should I do?   Do I need an x ray?   She sent a MyChart message a couple of days ago but never heard back. 7. BACK OVERUSE:  Any recent lifting of heavy objects, strenuous work or exercise?     No 8. MEDICINES: What have you taken so far for the pain? (e.g., nothing, acetaminophen , NSAIDS)     Naproxen  and a muscle relaxer.   I also completed the medicine my OB gave me for infection, a yeast infection a couple of days ago. 9. NEUROLOGIC SYMPTOMS: Do you have any weakness, numbness, or problems with bowel/bladder control?     No just in a lot of pain in my lower back. 10. OTHER SYMPTOMS: Do you have any other symptoms? (e.g., fever, abdomen pain, burning with urination, blood in urine)       It's hard to do anything due to the pain. 11. PREGNANCY: Is there any chance you are pregnant? When was  your last menstrual period?       Not asked since just saw OB dr.  Protocols used: Back Pain-A-AH FYI Only or Action Required?: Action required by provider: update on patient condition.  Patient was last seen in primary care on 04/16/2024 by Zarwolo, Gloria, FNP.  Called Nurse Triage reporting Back Pain.  Symptoms began a week ago.  Interventions attempted: Prescription medications: Naproxen  and muscle relaxer.  Symptoms are: gradually worsening.  Triage Disposition: Call PCP Now  Patient/caregiver understands and will follow disposition?: Yes

## 2024-05-01 NOTE — Telephone Encounter (Signed)
 Has tried the medication prescribed and states her symptoms are gradually worsening. She was told to follow up if pain did not improve. Pls advise next steps

## 2024-05-04 ENCOUNTER — Other Ambulatory Visit: Payer: Self-pay | Admitting: Family Medicine

## 2024-05-04 DIAGNOSIS — M5440 Lumbago with sciatica, unspecified side: Secondary | ICD-10-CM

## 2024-05-04 NOTE — Progress Notes (Unsigned)
dg 

## 2024-05-04 NOTE — Telephone Encounter (Signed)
 Message sent to patient

## 2024-05-04 NOTE — Telephone Encounter (Signed)
 Orders were placed for an X-ray of the lower back, and a referral was placed for physical therapy.

## 2024-05-05 ENCOUNTER — Encounter: Payer: Self-pay | Admitting: Physical Therapy

## 2024-05-05 ENCOUNTER — Ambulatory Visit: Admitting: Physical Therapy

## 2024-05-05 DIAGNOSIS — M5459 Other low back pain: Secondary | ICD-10-CM

## 2024-05-05 DIAGNOSIS — R293 Abnormal posture: Secondary | ICD-10-CM | POA: Diagnosis not present

## 2024-05-05 DIAGNOSIS — R252 Cramp and spasm: Secondary | ICD-10-CM | POA: Diagnosis not present

## 2024-05-05 DIAGNOSIS — M6281 Muscle weakness (generalized): Secondary | ICD-10-CM | POA: Diagnosis not present

## 2024-05-05 DIAGNOSIS — R262 Difficulty in walking, not elsewhere classified: Secondary | ICD-10-CM

## 2024-05-05 NOTE — Therapy (Signed)
 OUTPATIENT PHYSICAL THERAPY THORACOLUMBAR TREATMENT   Patient Name: Susan Branch MRN: [id number] DOB:Jun 01, 1996, 28 y.o., female Today's Date: 05/05/2024  END OF SESSION:  PT End of Session - 05/05/24 1616     Visit Number 2    Date for PT Re-Evaluation 06/25/24    Authorization Type [employer] APPROVED 7 VISITS 04/30/2024 - 06/28/2024 #[id number]    Authorization Time Period 04/30/2024 - 06/28/2024    Authorization - Visit Number 2    Authorization - Number of Visits 7    PT Start Time 1541    PT Stop Time 1616    PT Time Calculation (min) 35 min    Activity Tolerance Patient tolerated treatment well    Behavior During Therapy WFL for tasks assessed/performed           Past Medical History:  Diagnosis Date   Allergy    Anxiety    Borderline hypertension    no current med.   Gestational diabetes    Headache    Tonsillar and adenoid hypertrophy 05/2013   snores during sleep, mother denies apnea   Past Surgical History:  Procedure Laterality Date   CESAREAN SECTION N/A 08/15/2022   Procedure: CESAREAN SECTION;  Surgeon: Delana Ted Morrison, DO;  Location: [facility];  Service: Obstetrics;  Laterality: N/A;   CESAREAN SECTION N/A 01/18/2024   Procedure: CESAREAN DELIVERY;  Surgeon: Delana Ted Morrison, DO;  Location: [facility];  Service: Obstetrics;  Laterality: N/A;   CHOLECYSTECTOMY N/A 12/30/2022   Procedure: LAPAROSCOPIC CHOLECYSTECTOMY;  Surgeon: Aron Shoulders, MD;  Location: [facility];  Service: General;  Laterality: N/A;   TONSILLECTOMY AND ADENOIDECTOMY N/A 06/02/2013   Procedure: TONSILLECTOMY AND ADENOIDECTOMY;  Surgeon: Ana LELON Moccasin, MD;  Location: [facility];  Service: ENT;  Laterality: N/A;   TURBINATE REDUCTION Bilateral 06/02/2013   Procedure: TURBINATE REDUCTION BILATERAL;  Surgeon: Ana LELON Moccasin, MD;  Location: [facility];  Service: ENT;  Laterality: Bilateral;   WISDOM TOOTH EXTRACTION     all four 3/24   Patient Active Problem  List   Diagnosis Date Noted   Acute midline low back pain without sciatica 04/16/2024   Previous cesarean delivery affecting pregnancy 01/18/2024   Encounter for induction of labor 08/15/2022   Carrier of spinal muscular atrophy 02/26/2022   Elevated blood pressure reading with diagnosis of hypertension 02/16/2022   GBS bacteriuria 02/16/2022   Supervision of normal first pregnancy 02/13/2022   GAD (generalized anxiety disorder) 10/01/2018   Depression, major, single episode, moderate (HCC) 09/25/2017   Obesity 04/22/2014   Acne vulgaris 04/22/2014   Migraine headache 04/22/2014    PCP: Zarwolo, Gloria, FNP  REFERRING PROVIDER: Delana Ted Morrison, DO  REFERRING DIAG: M54.50 (ICD-10-CM) - Low back pain, unspecified  Rationale for Evaluation and Treatment: Rehabilitation  THERAPY DIAG:  Other low back pain  Muscle weakness (generalized)  Difficulty in walking, not elsewhere classified  Cramp and spasm  Abnormal posture  ONSET DATE: 04/29/2024  SUBJECTIVE:  SUBJECTIVE STATEMENT: Patient reports she is not currently having any pain. She had been doing her home exercises.  From Eval: Patient reports back pain since the beginning of her first pregnancy around 2 years ago.  This pain worsened after her 2nd c-section.  She reports this is worse with standing or bending fwd to care for her children.  She rates her pain at 2/10 with sitting still and as much as 9/10 with activity.  She describes moments of very sharp pain that take her breath.    PERTINENT HISTORY:  2 pregnancies and both C sections in the past 2 years.   PAIN:  Are you having pain? Yes: NPRS scale: 2/10 sitting still now, but up to 9/10 in the past 24 hours Pain location: lumbar Pain description: sharp at times but  generally aching Aggravating factors: standing bending fwd Relieving factors: rest, heat  PRECAUTIONS: None  RED FLAGS: None   WEIGHT BEARING RESTRICTIONS: No  FALLS:  Has patient fallen in last 6 months? 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  DIAGNOSTIC FINDINGS:  none  PATIENT SURVEYS:  Modified Oswestry:  MODIFIED OSWESTRY DISABILITY SCALE  Date: 04/30/24 Score  Pain intensity 4 =  Pain medication provides me with little relief from pain.  2. Personal care (washing, dressing, etc.) 1 =  I can take care of myself normally, but it increases my pain.  3. Lifting 4 = I can lift only very light weights  4. Walking 2 =  Pain prevents me from walking more than  mile.  5. Sitting 1 =  I can only sit in my favorite chair as long as I like.  6. Standing 4 =  Pain prevents me from standing more than 10 minutes.  7. Sleeping 2 =  Even when I take pain medication, I sleep less than 6 hours  8. Social Life 2 = Pain prevents me from participating in more energetic activities (eg. sports, dancing).  9. Traveling 1 =  I can travel anywhere, but it increases my pain.  10. Employment/ Homemaking 1 = My normal homemaking/job activities increase my pain, but I can still perform all that is required of me  Total 22/50   Interpretation of scores: Score Category Description  0-20% Minimal Disability The patient can cope with most living activities. Usually no  treatment is indicated apart from advice on lifting, sitting and exercise  21-40% Moderate Disability The patient experiences more pain and difficulty with sitting, lifting and standing. Travel and social life are more difficult and they may be disabled from work. Personal care, sexual activity and sleeping are not grossly affected, and the patient can usually be managed by conservative means  41-60% Severe Disability Pain remains the main problem in this group, but activities of daily living are affected. These patients require a detailed investigation  61-80% Crippled Back pain impinges on all aspects of the patient's life. Positive intervention is required  81-100% Bed-bound  These patients are either bed-bound or exaggerating their symptoms  [place]  A, Booth A, et al. Surgery versus conservative management of stable thoracolumbar fracture: the PRESTO feasibility RCT. [place]): [employer]; 2021 Nov. [facility] Technology Assessment, No. 25.62.) Appendix 3, Oswestry Disability Index category descriptors. Available from: FindJewelers.cz  Minimally Clinically Important Difference (MCID) = 12.8%  COGNITION: Overall cognitive status: Within functional limits for tasks assessed     SENSATION: WFL  MUSCLE LENGTH: Hamstrings: Right 45 deg; Left 45 deg Thomas test: Right pos; Left pos  POSTURE: rounded shoulders and decreased lumbar lordosis   LUMBAR ROM:   AROM eval  Flexion WNL  Extension WNL  Right lateral flexion WNL with pain  Left lateral flexion WNL with pain  Right rotation WNL with pain  Left rotation WNL with pain   (Blank rows = not tested)  LOWER EXTREMITY ROM:     WNL  LOWER EXTREMITY MMT:    Generally 4/5 bilateral LE's with exception of bilateral hip ext 4-/5  LUMBAR SPECIAL TESTS:  Straight leg raise test: Negative  FUNCTIONAL TESTS:  05/05/2024 5 times sit to stand: 7.18sec no UE support Timed up and go  (TUG): 6.83sec  GAIT: Distance walked: 50 feet Assistive device utilized: None Level of assistance: Complete Independence Comments: Normal heel to toe progression but antalgic start up  TREATMENT DATE:  05/05/2024 Patient was late to appointment NuStep Level 3 5 mins- PT present to discuss status (no arms) See 5STS and tug times above Hamstring stretch at stair 2 x 30 sec bilateral  Quadriceps stretch at stair 2 x 30s sec bilateral  Seated piriformis stretch  x 30 sec bilateral  Hooklying PPT x 20 Hooklying PPT + march x 20 total Hooklying alt hand and knee press x 10 each Hooklying TA activation + ball squeeze x 20 Sit to stand holding 10# KB 2 x 8 3 way stability ball stretch x 8 each direction     04/30/24 Initiated HEP and complete initial eval                                                                                                                                 PATIENT EDUCATION:  Education details: Initiated HEP and educated on proper posture and body mechanics for safe lifting and caring for her newborn and 38 month old.   Person educated: Patient Education method: Explanation, Demonstration, Verbal cues, and Handouts Education comprehension: verbalized understanding, returned demonstration, and verbal cues required  HOME EXERCISE PROGRAM: Access Code: [email or web address] URL: [email or web address] Date: 05/05/2024 Prepared by: Kristeen Sar  Exercises - Standing Hamstring Stretch on Chair  - 1 x daily - 7 x weekly - 1 sets - 3 reps - 30 sec hold - Quadricep Stretch with Chair and Counter Support  - 1 x daily - 7 x weekly - 1 sets - 3 reps - 30 sec hold - Seated Figure 4 Piriformis Stretch  - 1 x daily - 7 x weekly - 1 sets - 3 reps -  30 sed hold - Supine Posterior Pelvic Tilt  - 1 x daily - 7 x weekly - 2 sets - 10 reps - Supine March with Posterior Pelvic Tilt  - 1 x daily - 7 x weekly - 1 sets - 10 reps - Supine Alternating Knee Taps with Hands  - 1 x  daily - 7 x weekly - 1 sets - 10 reps  ASSESSMENT:  CLINICAL IMPRESSION: Patient presents to first follow up appointment since evaluation. She verbalized compliance with HEP and required minimal verbal cues for form correction. Incorporated TA activation exercises and added them to HEP. Patient required max verbal and tactile cues for adequate performance. Patient should progress appropriately with skilled therapy. Patient will benefit from skilled PT to address the below impairments and improve overall function.   OBJECTIVE IMPAIRMENTS: decreased knowledge of condition, difficulty walking, decreased ROM, decreased strength, increased fascial restrictions, increased muscle spasms, impaired flexibility, improper body mechanics, postural dysfunction, obesity, and pain.   ACTIVITY LIMITATIONS: carrying, lifting, bending, standing, squatting, sleeping, stairs, transfers, bed mobility, bathing, toileting, dressing, hygiene/grooming, and caring for others  PARTICIPATION LIMITATIONS: meal prep, cleaning, laundry, shopping, and community activity  PERSONAL FACTORS: Fitness and 1-2 comorbidities: diabetes, obesity are also affecting patient's functional outcome.   REHAB POTENTIAL: Good  CLINICAL DECISION MAKING: Stable/uncomplicated  EVALUATION COMPLEXITY: Low   GOALS: Goals reviewed with patient? Yes  SHORT TERM GOALS: Target date: 05/28/2024  Pain report to be no greater than 4/10  Baseline: Goal status: INITIAL  2.  Patient will be independent with initial HEP  Baseline:  Goal status: INITIAL   LONG TERM GOALS: Target date: 06/25/2024  Patient to report pain no greater than 2/10  Baseline:  Goal status: INITIAL  2.  Patient to be independent with advanced HEP  Baseline:  Goal status: INITIAL  3.  Patient to be able to stand or walk for at least 15 min without back and hip pain to be able to shower and stand to change her childrens diapers Baseline:  Goal status:  INITIAL  4.  Patient to be able to bend, stoop and squat with pain no greater than 2/10 to be able to dress and bathe her children and do normal household duties Baseline:  Goal status: INITIAL  5.  ODI to be 12 or less (less than 24%) Baseline:  Goal status: INITIAL  6.  Patient to report 85% improvement in overall symptoms Baseline:  Goal status: INITIAL  PLAN:  PT FREQUENCY: 1-2x/week  PT DURATION: 8 weeks  PLANNED INTERVENTIONS: 97110-Therapeutic exercises, 97530- Therapeutic activity, [id number]- Neuromuscular re-education, 97535- Self Care, [id number]- Manual therapy, [phone]- Gait training, [phone]- Aquatic Therapy, [phone]- Electrical stimulation (unattended), [phone]- Electrical stimulation (manual), [id number]- Ultrasound, [id number]- Traction (mechanical), [id number]- Ionotophoresis 4mg /ml Dexamethasone , Patient/Family education, Balance training, Stair training, Taping, Joint mobilization, Spinal mobilization, Cryotherapy, and Moist heat.  PLAN FOR NEXT SESSION: assess updated HEP; continue core strengthening exercises and lumbar mobility;   Kristeen Sar, PT 05/05/24 4:18 PM [facility] [address] [place], [place] [address] Phone # [phone] Fax [phone]

## 2024-05-06 ENCOUNTER — Ambulatory Visit (HOSPITAL_COMMUNITY)
Admission: RE | Admit: 2024-05-06 | Discharge: 2024-05-06 | Disposition: A | Discharge status: Home / Self Care | Source: Ambulatory Visit | Attending: Family Medicine | Admitting: Family Medicine

## 2024-05-06 DIAGNOSIS — M5126 Other intervertebral disc displacement, lumbar region: Secondary | ICD-10-CM | POA: Diagnosis not present

## 2024-05-06 DIAGNOSIS — M5440 Lumbago with sciatica, unspecified side: Secondary | ICD-10-CM | POA: Insufficient documentation

## 2024-05-06 DIAGNOSIS — M438X6 Other specified deforming dorsopathies, lumbar region: Secondary | ICD-10-CM | POA: Diagnosis not present

## 2024-05-08 ENCOUNTER — Ambulatory Visit: Payer: Self-pay | Admitting: Family Medicine

## 2024-05-08 NOTE — Telephone Encounter (Signed)
 FYI Only or Action Required?: Action required by provider: update on patient condition.  Patient was last seen in primary care on 04/16/2024 by Zarwolo, Gloria, FNP.  Called Nurse Triage reporting Pain.  Triage Disposition: See PCP When Office is Open (Within 3 Days)  Patient/caregiver understands and will follow disposition?: Yes        Copied from CRM [id number]. Topic: Clinical - Medication Question >> May 08, 2024 10:32 AM Tiffany S wrote: Reason for CRM: Patient is asking provider can prescribe pain meds please follow up with patient Reason for Disposition  [1] MODERATE back pain (e.g., interferes with normal activities) AND [2] present > 3 days  Answer Assessment - Initial Assessment Questions Pt is requesting pain medication for her back pain. Pt PCP told pt to go to ED for an x-ray and then would refer her to physical therapy. Pt states she went for the x-ray. Pt states she is in physical therapy but needs pain medication for this. This RN will send a high priority message to clinic for PCP to review. This RN made pt aware that PCP may want pt to come in for another appt and pt states understanding. Pt pharmacy:  [facility] [address] [place] - [address], [place] [address] Phone: [phone]  Fax: [phone]   ONSET: When did the pain begin? (e.g., minutes, hours, days)     2-3 weeks ago  LOCATION: Where does it hurt? (upper, mid or lower back)     Lower back  SEVERITY: How bad is the pain?  (e.g., Scale 1-10; mild, moderate, or severe)     Sharp pain 2-3/10 when sitting still; walking goes up; if walks more than 15 mins it becomes a 10  RADIATION: Does the pain shoot into your legs or somewhere else?     Middle back to where it takes over whole back towards stomach; makes pt feel sick and has to sit down  CAUSE:  What do you think is causing the back pain?      Pt thought it was her birth control so made an appt  with OBGYN but realized it was not that- pt OBGYN referred her to physical therapy; pt went to PCP and thought she had an infection so took a medication and was told to call back if still hurting; messaged PCP 1 week ago and hasn't heard back  NEUROLOGIC SYMPTOMS: Do you have any weakness, numbness, or problems with bowel/bladder control?     When touching back it feels numb  Protocols used: Back Pain-A-AH

## 2024-05-09 ENCOUNTER — Other Ambulatory Visit: Payer: Self-pay | Admitting: Family Medicine

## 2024-05-09 DIAGNOSIS — M545 Low back pain, unspecified: Secondary | ICD-10-CM

## 2024-05-09 MED ORDER — NAPROXEN 500 MG PO TABS: 500.0000 mg | ORAL_TABLET | Freq: Two times a day (BID) | ORAL | 1 refills | Status: AC | PRN

## 2024-05-13 ENCOUNTER — Ambulatory Visit: Admitting: Physical Therapy

## 2024-05-13 ENCOUNTER — Ambulatory Visit: Attending: Obstetrics and Gynecology | Admitting: Physical Therapy

## 2024-05-13 DIAGNOSIS — R262 Difficulty in walking, not elsewhere classified: Secondary | ICD-10-CM | POA: Insufficient documentation

## 2024-05-13 DIAGNOSIS — M6281 Muscle weakness (generalized): Secondary | ICD-10-CM | POA: Diagnosis present

## 2024-05-13 DIAGNOSIS — M5459 Other low back pain: Secondary | ICD-10-CM | POA: Insufficient documentation

## 2024-05-13 DIAGNOSIS — R293 Abnormal posture: Secondary | ICD-10-CM | POA: Diagnosis present

## 2024-05-13 NOTE — Therapy (Signed)
 OUTPATIENT PHYSICAL THERAPY THORACOLUMBAR TREATMENT   Patient Name: Susan Branch MRN: [id number] DOB:Jan 26, 1996, 28 y.o., female Today's Date: 05/13/2024  END OF SESSION:  PT End of Session - 05/13/24 1627     Visit Number 3    Date for PT Re-Evaluation 06/25/24    Authorization Type [employer] APPROVED 7 VISITS 04/30/2024 - 06/28/2024 #[id number]    Authorization Time Period 04/30/2024 - 06/28/2024    Authorization - Visit Number 3    Authorization - Number of Visits 7    PT Start Time 1628   arrival   PT Stop Time 1657    PT Time Calculation (min) 29 min    Activity Tolerance Patient tolerated treatment well    Behavior During Therapy WFL for tasks assessed/performed           Past Medical History:  Diagnosis Date   Allergy    Anxiety    Borderline hypertension    no current med.   Gestational diabetes    Headache    Tonsillar and adenoid hypertrophy 05/2013   snores during sleep, mother denies apnea   Past Surgical History:  Procedure Laterality Date   CESAREAN SECTION N/A 08/15/2022   Procedure: CESAREAN SECTION;  Surgeon: Delana Ted Morrison, DO;  Location: [facility];  Service: Obstetrics;  Laterality: N/A;   CESAREAN SECTION N/A 01/18/2024   Procedure: CESAREAN DELIVERY;  Surgeon: Delana Ted Morrison, DO;  Location: [facility];  Service: Obstetrics;  Laterality: N/A;   CHOLECYSTECTOMY N/A 12/30/2022   Procedure: LAPAROSCOPIC CHOLECYSTECTOMY;  Surgeon: Aron Shoulders, MD;  Location: [facility];  Service: General;  Laterality: N/A;   TONSILLECTOMY AND ADENOIDECTOMY N/A 06/02/2013   Procedure: TONSILLECTOMY AND ADENOIDECTOMY;  Surgeon: Ana LELON Moccasin, MD;  Location: [facility];  Service: ENT;  Laterality: N/A;   TURBINATE REDUCTION Bilateral 06/02/2013   Procedure: TURBINATE REDUCTION BILATERAL;  Surgeon: Ana LELON Moccasin, MD;  Location: [facility];  Service: ENT;  Laterality: Bilateral;   WISDOM TOOTH EXTRACTION     all four 3/24   Patient Active  Problem List   Diagnosis Date Noted   Acute midline low back pain without sciatica 04/16/2024   Previous cesarean delivery affecting pregnancy 01/18/2024   Encounter for induction of labor 08/15/2022   Carrier of spinal muscular atrophy 02/26/2022   Elevated blood pressure reading with diagnosis of hypertension 02/16/2022   GBS bacteriuria 02/16/2022   Supervision of normal first pregnancy 02/13/2022   GAD (generalized anxiety disorder) 10/01/2018   Depression, major, single episode, moderate (HCC) 09/25/2017   Obesity 04/22/2014   Acne vulgaris 04/22/2014   Migraine headache 04/22/2014    PCP: Zarwolo, Gloria, FNP  REFERRING PROVIDER: Delana Ted Morrison, DO  REFERRING DIAG: M54.50 (ICD-10-CM) - Low back pain, unspecified  Rationale for Evaluation and Treatment: Rehabilitation  THERAPY DIAG:  Other low back pain  Muscle weakness (generalized)  Difficulty in walking, not elsewhere classified  Abnormal posture  ONSET DATE: 04/29/2024  SUBJECTIVE:  SUBJECTIVE STATEMENT: Has been walking more (went to the beach this past weekend) and doing HEP. Reports she is now able to get her groceries with kids (which wasn't able to tolerate at all prior to PT) does end errands with 10/10 pain but prior to PT was 10/10 walking from car to store.   From Eval: Patient reports back pain since the beginning of her first pregnancy around 2 years ago.  This pain worsened after her 2nd c-section.  She reports this is worse with standing or bending fwd to care for her children.  She rates her pain at 2/10 with sitting still and as much as 9/10 with activity.  She describes moments of very sharp pain that take her breath.    PERTINENT HISTORY:  2 pregnancies and both C sections in the past 2 years.   PAIN:   Are you having pain? Yes: NPRS scale: 2/10 sitting still now, but up to 9/10 in the past 24 hours Pain location: lumbar Pain description: sharp at times but generally aching Aggravating factors: standing bending fwd Relieving factors: rest, heat  PRECAUTIONS: None  RED FLAGS: None   WEIGHT BEARING RESTRICTIONS: No  FALLS:  Has patient fallen in last 6 months? 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  DIAGNOSTIC FINDINGS:  none  PATIENT SURVEYS:  Modified Oswestry:  MODIFIED OSWESTRY DISABILITY SCALE  Date: 04/30/24 Score  Pain intensity 4 =  Pain medication provides me with little relief from pain.  2. Personal care (washing, dressing, etc.) 1 =  I can take care of myself normally, but it increases my pain.  3. Lifting 4 = I can lift only very light weights  4. Walking 2 =  Pain prevents me from walking more than  mile.  5. Sitting 1 =  I can only sit in my favorite chair as long as I like.  6. Standing 4 =  Pain prevents me from standing more than 10 minutes.  7. Sleeping 2 =  Even when I take pain medication, I sleep less than 6 hours  8. Social Life 2 = Pain prevents me from participating in more energetic activities (eg. sports, dancing).  9. Traveling 1 =  I can travel anywhere, but it increases my pain.  10. Employment/ Homemaking 1 = My normal homemaking/job activities increase my pain, but I can still perform all that is required of me   Total 22/50   Interpretation of scores: Score Category Description  0-20% Minimal Disability The patient can cope with most living activities. Usually no treatment is indicated apart from advice on lifting, sitting and exercise  21-40% Moderate Disability The patient experiences more pain and difficulty with sitting, lifting and standing. Travel and social life are more difficult and they may be disabled from work. Personal care, sexual activity and sleeping are not grossly affected, and the patient can usually be managed by conservative means  41-60% Severe Disability Pain remains the main problem in this group, but activities of daily living are affected. These patients require  a detailed investigation  61-80% Crippled Back pain impinges on all aspects of the patient's life. Positive intervention is required  81-100% Bed-bound  These patients are either bed-bound or exaggerating their symptoms  Bluford FORBES Zoe DELENA Karon DELENA, et al. Surgery versus conservative management of stable thoracolumbar fracture: the PRESTO feasibility RCT. [place]): [employer]; 2021 Nov. [employer] [facility] [employer] Assessment, No. 25.62.) Appendix 3, Oswestry Disability Index category descriptors. Available from: FindJewel[employer].cz  Minimally Clinically Important Difference (MCID) = 12.8%  COGNITION: Overall cognitive status: Within functional limits for tasks assessed     SENSATION: WFL  MUSCLE LENGTH: Hamstrings: Right 45 deg; Left 45 deg Thomas test: Right pos; Left pos  POSTURE: rounded shoulders and decreased lumbar lordosis   LUMBAR ROM:   AROM eval  Flexion WNL  Extension WNL  Right lateral flexion WNL with pain  Left lateral flexion WNL with pain  Right rotation WNL with pain  Left rotation WNL with pain   (Blank rows = not tested)  LOWER EXTREMITY ROM:     WNL  LOWER EXTREMITY MMT:    Generally 4/5 bilateral LE's with exception of bilateral hip ext  4-/5  LUMBAR SPECIAL TESTS:  Straight leg raise test: Negative  FUNCTIONAL TESTS:  05/05/2024 5 times sit to stand: 7.18sec no UE support Timed up and go (TUG): 6.83sec  GAIT: Distance walked: 50 feet Assistive device utilized: None Level of assistance: Complete Independence Comments: Normal heel to toe progression but antalgic start up  TREATMENT DATE:  05/13/24: NuStep Level 3 5 mins- PT present to discuss status (no arms) 2x10 bridges - mild back pain but with towel roll for transverse abdominis activation feedback no pain Hooklying opposite hand/knee ball press 2x10 Bil knee/hand press x10 Sidelying ball press with hip abduction 2x10 Windshield wipers x10 10s holds Single knee to chest 3x30s each Bird dogs x10 each Cat/cow x10 Karolynn pose 3x30s Thread the needle 2x30s each Lumbar roll outs rt/lt/ct x10 each Pelvic tilts ant/post 2x10 Pelvic circles x10 each    05/05/2024 Patient was late to appointment NuStep Level 3 5 mins- PT present to discuss status (no arms) See 5STS and tug times above Hamstring stretch at stair 2 x 30 sec bilateral  Quadriceps stretch at stair 2 x 30s sec bilateral  Seated piriformis stretch  x 30 sec bilateral  Hooklying PPT x 20 Hooklying PPT + march x 20 total Hooklying alt hand and knee press x 10 each Hooklying TA activation + ball squeeze x 20 Sit to stand holding 10# KB 2 x 8 3 way stability ball stretch x 8 each direction   04/30/24 Initiated HEP and complete initial eval                                                                                                                                 PATIENT EDUCATION:  Education details: Initiated HEP and educated on proper posture and body mechanics for  safe lifting and caring for her newborn and 41 month old.   Person educated: Patient Education method: Explanation, Demonstration, Verbal cues, and Handouts Education comprehension: verbalized understanding, returned demonstration,  and verbal cues required  HOME EXERCISE PROGRAM: Access Code: [email or web address] URL: [email or web address] Date: 05/05/2024 Prepared by: Kristeen Sar  Exercises - Standing Hamstring Stretch on Chair  - 1 x daily - 7 x weekly - 1 sets - 3 reps - 30 sec hold - Theatre manager with Chair and Counter Support  - 1 x daily - 7 x weekly - 1 sets - 3 reps - 30 sec hold - Seated Figure 4 Piriformis Stretch  - 1 x daily - 7 x weekly - 1 sets - 3 reps - 30 sed hold - Supine Posterior Pelvic Tilt  - 1 x daily - 7 x weekly - 2 sets - 10 reps - Supine March with Posterior Pelvic Tilt  - 1 x daily - 7 x weekly - 1 sets - 10 reps - Supine Alternating Knee Taps with Hands  - 1 x daily - 7 x weekly - 1 sets - 10 reps  ASSESSMENT:  CLINICAL IMPRESSION: Patient presents for skilled PT treatment, Pt reports she has been doing better but with increased activity pain does return to high levels. Limited session with pt's late arrival but she tolerated well, 1/10 pain did increase to 2/10 with strengthening and does need moderate consistent verbal cues for transverse abdominis activation activations but mobility at end decreased back to 1/10. Patient will benefit from skilled PT to address the below impairments and improve overall function.   OBJECTIVE IMPAIRMENTS: decreased knowledge of condition, difficulty walking, decreased ROM, decreased strength, increased fascial restrictions, increased muscle spasms, impaired flexibility, improper body mechanics, postural dysfunction, obesity, and pain.   ACTIVITY LIMITATIONS: carrying, lifting, bending, standing, squatting, sleeping, stairs, transfers, bed mobility, bathing, toileting, dressing, hygiene/grooming, and caring for others  PARTICIPATION LIMITATIONS: meal prep, cleaning, laundry, shopping, and community activity  PERSONAL FACTORS: Fitness and 1-2 comorbidities: diabetes, obesity are also affecting patient's functional outcome.   REHAB POTENTIAL:  Good  CLINICAL DECISION MAKING: Stable/uncomplicated  EVALUATION COMPLEXITY: Low   GOALS: Goals reviewed with patient? Yes  SHORT TERM GOALS: Target date: 05/28/2024  Pain report to be no greater than 4/10  Baseline: Goal status: INITIAL  2.  Patient will be independent with initial HEP  Baseline:  Goal status: INITIAL   LONG TERM GOALS: Target date: 06/25/2024  Patient to report pain no greater than 2/10  Baseline:  Goal status: INITIAL  2.  Patient to be independent with advanced HEP  Baseline:  Goal status: INITIAL  3.  Patient to be able to stand or walk for at least 15 min without back and hip pain to be able to shower and stand to change her childrens diapers Baseline:  Goal status: INITIAL  4.  Patient to be able to bend, stoop and squat with pain no greater than 2/10 to be able to dress and bathe her children and do normal household duties Baseline:  Goal status: INITIAL  5.  ODI to be 12 or less (less than 24%) Baseline:  Goal status: INITIAL  6.  Patient to report 85% improvement in overall symptoms Baseline:  Goal status: INITIAL  PLAN:  PT FREQUENCY: 1-2x/week  PT DURATION: 8 weeks  PLANNED INTERVENTIONS: 97110-Therapeutic exercises, 97530- Therapeutic activity, [id number]- Neuromuscular re-education, 97535- Self Care, [id number]- Manual therapy, [id number]- Gait training, [phone]- Aquatic Therapy, [phone]- Electrical stimulation (unattended), [phone]- Electrical stimulation (  manual), 02964- Ultrasound, [id number]- Traction (mechanical), 02966- Ionotophoresis 4mg /ml Dexamethasone , Patient/Family education, Balance training, Stair training, Taping, Joint mobilization, Spinal mobilization, Cryotherapy, and Moist heat.  PLAN FOR NEXT SESSION: assess updated HEP; continue core strengthening exercises and lumbar mobility;   Darryle Navy, PT, DPT 09/03/254:58 PM  [facility] [address], [address] [place], [place] [address] Phone #  [phone] Fax [phone]

## 2024-05-19 ENCOUNTER — Ambulatory Visit: Payer: Self-pay | Admitting: Family Medicine

## 2024-05-20 ENCOUNTER — Encounter: Payer: Self-pay | Admitting: Physical Therapy

## 2024-05-20 ENCOUNTER — Ambulatory Visit: Admitting: Physical Therapy

## 2024-05-20 DIAGNOSIS — M6281 Muscle weakness (generalized): Secondary | ICD-10-CM

## 2024-05-20 DIAGNOSIS — R293 Abnormal posture: Secondary | ICD-10-CM

## 2024-05-20 DIAGNOSIS — M5459 Other low back pain: Secondary | ICD-10-CM

## 2024-05-20 DIAGNOSIS — R262 Difficulty in walking, not elsewhere classified: Secondary | ICD-10-CM

## 2024-05-20 NOTE — Therapy (Signed)
 OUTPATIENT PHYSICAL THERAPY THORACOLUMBAR TREATMENT   Patient Name: Susan Branch MRN: [id number] DOB:02-19-1996, 28 y.o., female Today's Date: 05/20/2024  END OF SESSION:  PT End of Session - 05/20/24 1659     Visit Number 4    Date for PT Re-Evaluation 06/25/24    Authorization Type [employer] APPROVED 7 VISITS 04/30/2024 - 06/28/2024 #[id number]    Authorization Time Period 04/30/2024 - 06/28/2024    Authorization - Visit Number 4    Authorization - Number of Visits 7    PT Start Time 1547    PT Stop Time 1615    PT Time Calculation (min) 28 min    Activity Tolerance Patient tolerated treatment well    Behavior During Therapy WFL for tasks assessed/performed            Past Medical History:  Diagnosis Date   Allergy    Anxiety    Borderline hypertension    no current med.   Gestational diabetes    Headache    Tonsillar and adenoid hypertrophy 05/2013   snores during sleep, mother denies apnea   Past Surgical History:  Procedure Laterality Date   CESAREAN SECTION N/A 08/15/2022   Procedure: CESAREAN SECTION;  Surgeon: Delana Ted Morrison, DO;  Location: [facility];  Service: Obstetrics;  Laterality: N/A;   CESAREAN SECTION N/A 01/18/2024   Procedure: CESAREAN DELIVERY;  Surgeon: Delana Ted Morrison, DO;  Location: [facility];  Service: Obstetrics;  Laterality: N/A;   CHOLECYSTECTOMY N/A 12/30/2022   Procedure: LAPAROSCOPIC CHOLECYSTECTOMY;  Surgeon: Aron Shoulders, MD;  Location: [facility];  Service: General;  Laterality: N/A;   TONSILLECTOMY AND ADENOIDECTOMY N/A 06/02/2013   Procedure: TONSILLECTOMY AND ADENOIDECTOMY;  Surgeon: Ana LELON Moccasin, MD;  Location: [facility];  Service: ENT;  Laterality: N/A;   TURBINATE REDUCTION Bilateral 06/02/2013   Procedure: TURBINATE REDUCTION BILATERAL;  Surgeon: Ana LELON Moccasin, MD;  Location: [facility];  Service: ENT;  Laterality: Bilateral;   WISDOM TOOTH EXTRACTION     all four 3/24   Patient Active Problem  List   Diagnosis Date Noted   Acute midline low back pain without sciatica 04/16/2024   Previous cesarean delivery affecting pregnancy 01/18/2024   Encounter for induction of labor 08/15/2022   Carrier of spinal muscular atrophy 02/26/2022   Elevated blood pressure reading with diagnosis of hypertension 02/16/2022   GBS bacteriuria 02/16/2022   Supervision of normal first pregnancy 02/13/2022   GAD (generalized anxiety disorder) 10/01/2018   Depression, major, single episode, moderate (HCC) 09/25/2017   Obesity 04/22/2014   Acne vulgaris 04/22/2014   Migraine headache 04/22/2014    PCP: Zarwolo, Gloria, FNP  REFERRING PROVIDER: Delana Ted Morrison, DO  REFERRING DIAG: M54.50 (ICD-10-CM) - Low back pain, unspecified  Rationale for Evaluation and Treatment: Rehabilitation  THERAPY DIAG:  Other low back pain  Muscle weakness (generalized)  Difficulty in walking, not elsewhere classified  Abnormal posture  ONSET DATE: 04/29/2024  SUBJECTIVE:  SUBJECTIVE STATEMENT: Patient reports pain is not bad today. Overall, in the past week it has been better but she has had some moments of increased pain.  From Eval: Patient reports back pain since the beginning of her first pregnancy around 2 years ago.  This pain worsened after her 2nd c-section.  She reports this is worse with standing or bending fwd to care for her children.  She rates her pain at 2/10 with sitting still and as much as 9/10 with activity.  She describes moments of very sharp pain that take her breath.    PERTINENT HISTORY:  2 pregnancies and both C sections in the past 2 years.   PAIN:  Are you having pain? Yes: NPRS scale: 2/10 sitting still now, but up to 9/10 in the past 24 hours Pain location: lumbar Pain description: sharp  at times but generally aching Aggravating factors: standing bending fwd Relieving factors: rest, heat  PRECAUTIONS: None  RED FLAGS: None   WEIGHT BEARING RESTRICTIONS: No  FALLS:  Has patient fallen in last 6 months? 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  DIAGNOSTIC FINDINGS:  none  PATIENT SURVEYS:  Modified Oswestry:  MODIFIED OSWESTRY DISABILITY SCALE  Date: 04/30/24 Score  Pain intensity 4 =  Pain medication provides me with little relief from pain.  2. Personal care (washing, dressing, etc.) 1 =  I can take care of myself normally, but it increases my pain.  3. Lifting 4 = I can lift only very light weights  4. Walking 2 =  Pain prevents me from walking more than  mile.  5. Sitting 1 =  I can only sit in my favorite chair as long as I like.  6. Standing 4 =  Pain prevents me from standing more than 10 minutes.  7. Sleeping 2 =  Even when I take pain medication, I sleep less than 6 hours  8. Social Life 2 = Pain prevents me from participating in more energetic activities (eg. sports, dancing).  9. Traveling 1 =  I can travel anywhere, but it increases my pain.  10. Employment/ Homemaking 1 = My normal homemaking/job activities increase my pain, but I can still perform all that is required of me  Total 22/50   Interpretation of scores: Score Category Description  0-20% Minimal Disability The patient can cope with most living activities.  Usually no treatment is indicated apart from advice on lifting, sitting and exercise  21-40% Moderate Disability The patient experiences more pain and difficulty with sitting, lifting and standing. Travel and social life are more difficult and they may be disabled from work. Personal care, sexual activity and sleeping are not grossly affected, and the patient can usually be managed by conservative means  41-60% Severe Disability Pain remains the main problem in this group, but activities of daily living are affected. These patients require a detailed investigation  61-80% Crippled Back pain impinges on all aspects of the patient's life. Positive intervention is required  81-100% Bed-bound  These patients are either  bed-bound or exaggerating their symptoms  Bluford FORBES Zoe DELENA Karon DELENA, et al. Surgery versus conservative management of stable thoracolumbar fracture: the PRESTO feasibility RCT. [place]): [employer]; 2021 Nov. [facility] Technology Assessment, No. 25.62.) Appendix 3, Oswestry Disability Index category descriptors. Available from: FindJewel[employer].cz  Minimally Clinically Important Difference (MCID) = 12.8%  COGNITION: Overall cognitive status: Within functional limits for tasks assessed     SENSATION: WFL  MUSCLE LENGTH: Hamstrings: Right 45 deg; Left 45 deg Thomas test: Right pos; Left pos  POSTURE: rounded shoulders and decreased lumbar lordosis   LUMBAR ROM:   AROM eval  Flexion WNL  Extension WNL  Right lateral flexion WNL with pain  Left lateral flexion WNL with pain  Right rotation WNL with pain  Left rotation WNL with pain   (Blank rows = not tested)  LOWER EXTREMITY ROM:     WNL  LOWER EXTREMITY MMT:    Generally 4/5 bilateral LE's with exception of bilateral hip ext 4-/5  LUMBAR SPECIAL TESTS:  Straight leg raise test: Negative  FUNCTIONAL TESTS:  05/05/2024 5 times sit to stand: 7.18sec no UE  support Timed up and go (TUG): 6.83sec  GAIT: Distance walked: 50 feet Assistive device utilized: None Level of assistance: Complete Independence Comments: Normal heel to toe progression but antalgic start up  TREATMENT DATE:  05/20/24: Patient was late to appointment Cat Cow x 10 Thread the needle x 8 each side Hooklying opposite hand/knee ball press 2x10 Bridges + ball squeeze 2 x 10 TA activation + SLR 2 x 10 bilateral  Sit to stand 15# 2 x10 Sidelying ball press with hip abduction 2x10 Review of Patient's imaging Lumbar roll outs rt/lt/ct x10 each    05/13/24: NuStep Level 3 5 mins- PT present to discuss status (no arms) 2x10 bridges - mild back pain but with towel roll for transverse abdominis activation feedback no pain Hooklying opposite hand/knee ball press 2x10 Bil knee/hand press x10 Sidelying ball press with hip abduction 2x10 Windshield wipers x10 10s holds Single knee to chest 3x30s each Bird dogs x10 each Cat/cow x10 Karolynn pose 3x30s Thread the needle 2x30s each Lumbar roll outs rt/lt/ct x10 each Pelvic tilts ant/post 2x10 Pelvic circles x10 each    05/05/2024 Patient was late to appointment NuStep Level 3 5 mins- PT present to discuss status (no arms) See 5STS and tug times above Hamstring stretch at stair 2 x 30 sec bilateral  Quadriceps stretch at stair 2 x 30s sec bilateral  Seated piriformis stretch  x 30 sec bilateral  Hooklying PPT x 20 Hooklying PPT + march x 20 total Hooklying alt hand and knee press x 10 each Hooklying TA activation + ball squeeze x 20 Sit to stand holding 10# KB 2 x 8 3 way stability ball stretch x 8 each direction    PATIENT EDUCATION:  Education details: Initiated HEP and educated on proper posture and body mechanics for safe lifting and caring for her newborn and 47 month old.   Person educated: Patient Education method: Explanation, Demonstration, Verbal cues, and Handouts Education comprehension: verbalized  understanding, returned demonstration, and verbal cues required  HOME EXERCISE PROGRAM: Access Code: [email or web address] URL: [email or web address] Date: 05/20/2024 Prepared by: Kristeen Sar  Exercises - Standing Hamstring Stretch on Chair  - 1 x daily - 7 x weekly - 1 sets - 3 reps - 30 sec hold - Quadricep Stretch with Chair and Counter Support  - 1 x daily - 7 x weekly - 1 sets -  3 reps - 30 sec hold - Seated Figure 4 Piriformis Stretch  - 1 x daily - 7 x weekly - 1 sets - 3 reps - 30 sed hold - Supine Posterior Pelvic Tilt  - 1 x daily - 7 x weekly - 2 sets - 10 reps - Supine March with Posterior Pelvic Tilt  - 1 x daily - 7 x weekly - 1 sets - 10 reps - Supine Alternating Knee Taps with Hands  - 1 x daily - 7 x weekly - 1 sets - 10 reps - Standing Anti-Rotation Press with Anchored Resistance  - 1 x daily - 7 x weekly - 1 sets - 15 reps - Resistance Pulldown with March  - 1 x daily - 7 x weekly - 1-2 sets - 10 reps ASSESSMENT:  CLINICAL IMPRESSION: Patient continues to report improved function since starting skilled therapy. Picking up her kids, grocery shopping, and standing to cook continues to be triggers for her back patient. Patient required moderate verbal & tactile cues for TA activation while performing exercises. Updated HEP to include exercise progressions. Patient will benefit from skilled PT to address the below impairments and improve overall function.   OBJECTIVE IMPAIRMENTS: decreased knowledge of condition, difficulty walking, decreased ROM, decreased strength, increased fascial restrictions, increased muscle spasms, impaired flexibility, improper body mechanics, postural dysfunction, obesity, and pain.   ACTIVITY LIMITATIONS: carrying, lifting, bending, standing, squatting, sleeping, stairs, transfers, bed mobility, bathing, toileting, dressing, hygiene/grooming, and caring for others  PARTICIPATION LIMITATIONS: meal prep, cleaning, laundry, shopping, and community  activity  PERSONAL FACTORS: Fitness and 1-2 comorbidities: diabetes, obesity are also affecting patient's functional outcome.   REHAB POTENTIAL: Good  CLINICAL DECISION MAKING: Stable/uncomplicated  EVALUATION COMPLEXITY: Low   GOALS: Goals reviewed with patient? Yes  SHORT TERM GOALS: Target date: 05/28/2024  Pain report to be no greater than 4/10  Baseline: Goal status: INITIAL  2.  Patient will be independent with initial HEP  Baseline:  Goal status: INITIAL   LONG TERM GOALS: Target date: 06/25/2024  Patient to report pain no greater than 2/10  Baseline:  Goal status: INITIAL  2.  Patient to be independent with advanced HEP  Baseline:  Goal status: INITIAL  3.  Patient to be able to stand or walk for at least 15 min without back and hip pain to be able to shower and stand to change her childrens diapers Baseline:  Goal status: INITIAL  4.  Patient to be able to bend, stoop and squat with pain no greater than 2/10 to be able to dress and bathe her children and do normal household duties Baseline:  Goal status: INITIAL  5.  ODI to be 12 or less (less than 24%) Baseline:  Goal status: INITIAL  6.  Patient to report 85% improvement in overall symptoms Baseline:  Goal status: INITIAL  PLAN:  PT FREQUENCY: 1-2x/week  PT DURATION: 8 weeks  PLANNED INTERVENTIONS: 97110-Therapeutic exercises, 97530- Therapeutic activity, 97112- Neuromuscular re-education, 97535- Self Care, 02859- Manual therapy, [phone]- Gait training, [phone]- Aquatic Therapy, [phone]- Electrical stimulation (unattended), [phone]- Electrical stimulation (manual), [id number]- Ultrasound, [id number]- Traction (mechanical), [id number]- Ionotophoresis 4mg /ml Dexamethasone , Patient/Family education, Balance training, Stair training, Taping, Joint mobilization, Spinal mobilization, Cryotherapy, and Moist heat.  PLAN FOR NEXT SESSION:  continue core strengthening exercises and lumbar mobility;    Kristeen Sar, PT 05/20/24  5:01 PM [facility] [address] [place], [place] [address] Phone # [phone] Fax [phone]

## 2024-05-27 ENCOUNTER — Encounter: Payer: Self-pay | Admitting: Physical Therapy

## 2024-05-27 ENCOUNTER — Ambulatory Visit: Admitting: Physical Therapy

## 2024-05-27 ENCOUNTER — Ambulatory Visit: Payer: Self-pay

## 2024-05-27 DIAGNOSIS — M5459 Other low back pain: Secondary | ICD-10-CM | POA: Diagnosis not present

## 2024-05-27 DIAGNOSIS — R293 Abnormal posture: Secondary | ICD-10-CM

## 2024-05-27 DIAGNOSIS — R262 Difficulty in walking, not elsewhere classified: Secondary | ICD-10-CM

## 2024-05-27 DIAGNOSIS — M6281 Muscle weakness (generalized): Secondary | ICD-10-CM

## 2024-05-27 NOTE — Telephone Encounter (Signed)
 FYI Only or Action Required?: Action required by provider: request for appointment, clinical question for provider, and update on patient condition.  Patient was last seen in primary care on 04/16/2024 by Zarwolo, Gloria, FNP.  Called Nurse Triage reporting Back Pain.  Symptoms began several months ago.  Interventions attempted: Prescription medications: naproxan, flexaril and Rest, hydration, or home remedies.  Symptoms are: unchanged.  Triage Disposition: See PCP Within 2 Weeks  Patient/caregiver understands and will follow disposition?: Unsure   Copied from CRM [id number]. Topic: Clinical - Red Word Triage >> May 27, 2024  4:21 PM Susan Branch wrote: [facility] that prompted transfer to Nurse Triage: Patient is having back pain. Reason for Disposition  Back pain present > 2 weeks  Answer Assessment - Initial Assessment Questions Additional info: 1) Back pain since birth is persisting. She is already in physical therapy rx by OB.  2) Flexaril was not helpful.  3) Finished Naproxen  as advised by pcp, but that didn't really give any relief.  4) Requesting pain medication to [facility]. Willing for follow up but pcp does not have appointments until January. Patient is asking if Dr. Tobie has appointment available. Aware message will be sent to provider for pain medication request and for schedule work in or other recommendation.    1. ONSET: When did the pain begin? (e.g., minutes, hours, days)     1.5 months ago 2. LOCATION: Where does it hurt? (upper, mid or lower back)     All over 3. SEVERITY: How bad is the pain?  (e.g., Scale 1-10; mild, moderate, or severe)     Walking 5/10. Can go up to 10/10 with vigorous activity.  4. PATTERN: Is the pain constant? (e.g., yes, no; constant, intermittent)      constant 5. RADIATION: Does the pain shoot into your legs or somewhere else?     denies 6. CAUSE:  What do you think is causing the back pain?      Unsure-pain since  pregnancy 7. BACK OVERUSE:  Any recent lifting of heavy objects, strenuous work or exercise?     denies 8. MEDICINES: What have you taken so far for the pain? (e.g., nothing, acetaminophen , NSAIDS)     Naproxen , flexaril 9. NEUROLOGIC SYMPTOMS: Do you have any weakness, numbness, or problems with bowel/bladder control?     She reports when she presses on her back it goes numb 10. OTHER SYMPTOMS: Do you have any other symptoms? (e.g., fever, abdomen pain, burning with urination, blood in urine)       denies 11. PREGNANCY: Is there any chance you are pregnant? When was your last menstrual period?       No  Protocols used: Back Pain-A-AH

## 2024-05-27 NOTE — Therapy (Signed)
 OUTPATIENT PHYSICAL THERAPY THORACOLUMBAR TREATMENT   Patient Name: Susan Branch MRN: [id number] DOB:[birth year], 28 y.o., female Today's Date: 05/27/2024  END OF SESSION:  PT End of Session - 05/27/24 1624     Visit Number 5    Date for PT Re-Evaluation 06/25/24    Authorization Type CARELON APPROVED 7 VISITS 04/30/2024 - 06/28/2024 #[id number]    Authorization Time Period 04/30/2024 - 06/28/2024    Authorization - Visit Number 5    Authorization - Number of Visits 7    PT Start Time 1546    PT Stop Time 1616    PT Time Calculation (min) 30 min    Activity Tolerance Patient tolerated treatment well    Behavior During Therapy WFL for tasks assessed/performed             Past Medical History:  Diagnosis Date   Allergy    Anxiety    Borderline hypertension    no current med.   Gestational diabetes    Headache    Tonsillar and adenoid hypertrophy 05/2013   snores during sleep, mother denies apnea   Past Surgical History:  Procedure Laterality Date   CESAREAN SECTION N/A 08/15/2022   Procedure: CESAREAN SECTION;  Surgeon: Delana Ted Morrison, DO;  Location: [facility];  Service: Obstetrics;  Laterality: N/A;   CESAREAN SECTION N/A 01/18/2024   Procedure: CESAREAN DELIVERY;  Surgeon: Delana Ted Morrison, DO;  Location: [facility];  Service: Obstetrics;  Laterality: N/A;   CHOLECYSTECTOMY N/A 12/30/2022   Procedure: LAPAROSCOPIC CHOLECYSTECTOMY;  Surgeon: Aron Shoulders, MD;  Location: [facility];  Service: General;  Laterality: N/A;   TONSILLECTOMY AND ADENOIDECTOMY N/A 06/02/2013   Procedure: TONSILLECTOMY AND ADENOIDECTOMY;  Surgeon: Ana LELON Moccasin, MD;  Location: [facility];  Service: ENT;  Laterality: N/A;   TURBINATE REDUCTION Bilateral 06/02/2013   Procedure: TURBINATE REDUCTION BILATERAL;  Surgeon: Ana LELON Moccasin, MD;  Location: [facility];  Service: ENT;  Laterality: Bilateral;   WISDOM TOOTH EXTRACTION     all four 3/24   Patient Active Problem  List   Diagnosis Date Noted   Acute midline low back pain without sciatica 04/16/2024   Previous cesarean delivery affecting pregnancy 01/18/2024   Encounter for induction of labor 08/15/2022   Carrier of spinal muscular atrophy 02/26/2022   Elevated blood pressure reading with diagnosis of hypertension 02/16/2022   GBS bacteriuria 02/16/2022   Supervision of normal first pregnancy 02/13/2022   GAD (generalized anxiety disorder) 10/01/2018   Depression, major, single episode, moderate (HCC) 09/25/2017   Obesity 04/22/2014   Acne vulgaris 04/22/2014   Migraine headache 04/22/2014    PCP: Zarwolo, Gloria, FNP  REFERRING PROVIDER: Delana Ted Morrison, DO  REFERRING DIAG: M54.50 (ICD-10-CM) - Low back pain, unspecified  Rationale for Evaluation and Treatment: Rehabilitation  THERAPY DIAG:  Other low back pain  Muscle weakness (generalized)  Difficulty in walking, not elsewhere classified  Abnormal posture  ONSET DATE: 04/29/2024  SUBJECTIVE:  SUBJECTIVE STATEMENT: Patient presents with 2/10 back pain today. She had a really bad back pain day two days ago.  From Eval: Patient reports back pain since the beginning of her first pregnancy around 2 years ago.  This pain worsened after her 2nd c-section.  She reports this is worse with standing or bending fwd to care for her children.  She rates her pain at 2/10 with sitting still and as much as 9/10 with activity.  She describes moments of very sharp pain that take her breath.    PERTINENT HISTORY:  2 pregnancies and both C sections in the past 2 years.   PAIN:  Are you having pain? Yes: NPRS scale: 2/10 sitting still now, but up to 9/10 in the past 24 hours Pain location: lumbar Pain description: sharp at times but generally  aching Aggravating factors: standing bending fwd Relieving factors: rest, heat  PRECAUTIONS: None  RED FLAGS: None   WEIGHT BEARING RESTRICTIONS: No  FALLS:  Has patient fallen in last 6 months? 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  DIAGNOSTIC FINDINGS:  none  PATIENT SURVEYS:  Modified Oswestry:  MODIFIED OSWESTRY DISABILITY SCALE  Date: 04/30/24 Score  Pain intensity 4 =  Pain medication provides me with little relief from pain.  2. Personal care (washing, dressing, etc.) 1 =  I can take care of myself normally, but it increases my pain.  3. Lifting 4 = I can lift only very light weights  4. Walking 2 =  Pain prevents me from walking more than  mile.  5. Sitting 1 =  I can only sit in my favorite chair as long as I like.  6. Standing 4 =  Pain prevents me from standing more than 10 minutes.  7. Sleeping 2 =  Even when I take pain medication, I sleep less than 6 hours  8. Social Life 2 = Pain prevents me from participating in more energetic activities (eg. sports, dancing).  9. Traveling 1 =  I can travel anywhere, but it increases my pain.  10. Employment/ Homemaking 1 = My normal homemaking/job activities increase my pain, but I can still perform all that is required of me  Total 22/50   Interpretation of scores: Score Category Description  0-20% Minimal Disability The patient can cope with most living activities. Usually no treatment is  indicated apart from advice on lifting, sitting and exercise  21-40% Moderate Disability The patient experiences more pain and difficulty with sitting, lifting and standing. Travel and social life are more difficult and they may be disabled from work. Personal care, sexual activity and sleeping are not grossly affected, and the patient can usually be managed by conservative means  41-60% Severe Disability Pain remains the main problem in this group, but activities of daily living are affected. These patients require a detailed investigation  61-80% Crippled Back pain impinges on all aspects of the patient's life. Positive intervention is required  81-100% Bed-bound  These patients are either bed-bound or exaggerating their symptoms  Bluford  E, Scantlebury A, Booth A, et al. Surgery versus conservative management of stable thoracolumbar fracture: the PRESTO feasibility RCT. [place]): [employer]; 2021 Nov. [facility] Technology Assessment, No. 25.62.) Appendix 3, Oswestry Disability Index category descriptors. Available from: FindJewelers.cz  Minimally Clinically Important Difference (MCID) = 12.8%  COGNITION: Overall cognitive status: Within functional limits for tasks assessed     SENSATION: WFL  MUSCLE LENGTH: Hamstrings: Right 45 deg; Left 45 deg Thomas test: Right pos; Left pos  POSTURE: rounded shoulders and decreased lumbar lordosis   LUMBAR ROM:   AROM eval  Flexion WNL  Extension WNL  Right lateral flexion WNL with pain  Left lateral flexion WNL with pain  Right rotation WNL with pain  Left rotation WNL with pain   (Blank rows = not tested)  LOWER EXTREMITY ROM:     WNL  LOWER EXTREMITY MMT:    Generally 4/5 bilateral LE's with exception of bilateral hip ext 4-/5  LUMBAR SPECIAL TESTS:  Straight leg raise test: Negative  FUNCTIONAL TESTS:  05/05/2024 5 times sit to stand: 7.18sec no UE support Timed up and go (TUG):  6.83sec  GAIT: Distance walked: 50 feet Assistive device utilized: None Level of assistance: Complete Independence Comments: Normal heel to toe progression but antalgic start up  TREATMENT DATE:  05/27/24: Patient was late to appointment NuStep Level 7 6 min- PT present to discuss status Review of standing posture and performing small pelvic tilt for neutral alignment Standing chest press with 10# DB Standing chop with 10# DB x 10 each direction Sit to stand holding two 10# DB 2 x 10 Hooklying TA activation + march x 20 bilateral  Hooklying 90/90 toe tap x 20 total Supine LTR x 10 each side 5 sec hold Seated blue stability ball roll out x 5 each direction    05/20/24: Patient was late to appointment Cat Cow x 10 Thread the needle x 8 each side Hooklying opposite hand/knee ball press 2x10 Bridges + ball squeeze 2 x 10 TA activation + SLR 2 x 10 bilateral  Sit to stand 15# 2 x10 Sidelying ball press with hip abduction 2x10 Review of Patient's imaging Lumbar roll outs rt/lt/ct x10 each    05/13/24: NuStep Level 3 5 mins- PT present to discuss status (no arms) 2x10 bridges - mild back pain but with towel roll for transverse abdominis activation feedback no pain Hooklying opposite hand/knee ball press 2x10 Bil knee/hand press x10 Sidelying ball press with hip abduction 2x10 Windshield wipers x10 10s holds Single knee to chest 3x30s each Bird dogs x10 each Cat/cow x10 Karolynn pose 3x30s Thread the needle 2x30s each Lumbar roll outs rt/lt/ct x10 each Pelvic tilts ant/post 2x10 Pelvic circles x10 each    05/05/2024 Patient was late to appointment NuStep Level 3 5 mins- PT present to discuss status (no arms) See 5STS and tug times above Hamstring stretch at stair 2 x 30 sec bilateral  Quadriceps stretch at stair 2 x 30s sec bilateral  Seated piriformis stretch  x 30 sec bilateral  Hooklying PPT x 20 Hooklying PPT + march x 20 total Hooklying alt hand and knee press x 10  each Hooklying TA activation + ball squeeze x 20 Sit to stand holding 10# KB 2 x 8 3 way stability ball stretch x 8 each direction    PATIENT EDUCATION:  Education details: Initiated HEP and educated on proper posture and body mechanics for safe lifting and caring for her newborn and 56 month old.  Person educated: Patient Education method: Explanation, Demonstration, Verbal cues, and Handouts Education comprehension: verbalized understanding, returned demonstration, and verbal cues required  HOME EXERCISE PROGRAM: Access Code: [email or web address] URL: [email or web address] Date: 05/27/2024 Prepared by: Kristeen Sar  Exercises - Standing Hamstring Stretch on Chair  - 1 x daily - 7 x weekly - 1 sets - 3 reps - 30 sec hold - Theatre manager with Chair and Counter Support  - 1 x daily - 7 x weekly - 1 sets - 3 reps - 30 sec hold - Seated Figure 4 Piriformis Stretch  - 1 x daily - 7 x weekly - 1 sets - 3 reps - 30 sed hold - Supine Posterior Pelvic Tilt  - 1 x daily - 7 x weekly - 2 sets - 10 reps - Supine March with Posterior Pelvic Tilt  - 1 x daily - 7 x weekly - 1 sets - 10 reps - Supine Alternating Knee Taps with Hands  - 1 x daily - 7 x weekly - 1 sets - 10 reps - Standing Anti-Rotation Press with Anchored Resistance  - 1 x daily - 7 x weekly - 1 sets - 15 reps - Resistance Pulldown with March  - 1 x daily - 7 x weekly - 1-2 sets - 10 reps - Supine 90/90 Alternating Heel Touches with Posterior Pelvic Tilt  - 1 x daily - 7 x weekly - 2 sets - 10 reps - Supine Dead Bug with Leg Extension  - 1 x daily - 7 x weekly - 2 sets - 5 reps ASSESSMENT:  CLINICAL IMPRESSION: Ellanore presents to skilled therapy with minimal back pain today, but she still has her days of higher back pain levels. Reviewed patient's standing posture and demonstrated performing a small pelvic tilt to better align pelvis. Progressed patient's core exercises to unsupported  and this was a good challenge for  patient. She required occasional verbal cues to maintain TA engagement. Updated HEP to include exercise progressions. Patient will benefit from skilled PT to address the below impairments and improve overall function.    OBJECTIVE IMPAIRMENTS: decreased knowledge of condition, difficulty walking, decreased ROM, decreased strength, increased fascial restrictions, increased muscle spasms, impaired flexibility, improper body mechanics, postural dysfunction, obesity, and pain.   ACTIVITY LIMITATIONS: carrying, lifting, bending, standing, squatting, sleeping, stairs, transfers, bed mobility, bathing, toileting, dressing, hygiene/grooming, and caring for others  PARTICIPATION LIMITATIONS: meal prep, cleaning, laundry, shopping, and community activity  PERSONAL FACTORS: Fitness and 1-2 comorbidities: diabetes, obesity are also affecting patient's functional outcome.   REHAB POTENTIAL: Good  CLINICAL DECISION MAKING: Stable/uncomplicated  EVALUATION COMPLEXITY: Low   GOALS: Goals reviewed with patient? Yes  SHORT TERM GOALS: Target date: 05/28/2024  Pain report to be no greater than 4/10  Baseline: Goal status: INITIAL  2.  Patient will be independent with initial HEP  Baseline:  Goal status: INITIAL   LONG TERM GOALS: Target date: 06/25/2024  Patient to report pain no greater than 2/10  Baseline:  Goal status: INITIAL  2.  Patient to be independent with advanced HEP  Baseline:  Goal status: INITIAL  3.  Patient to be able to stand or walk for at least 15 min without back and hip pain to be able to shower and stand to change her childrens diapers Baseline:  Goal status: INITIAL  4.  Patient to be able to bend, stoop and squat with pain no greater than 2/10 to be able to dress and bathe her children and do normal household  duties Baseline:  Goal status: INITIAL  5.  ODI to be 12 or less (less than 24%) Baseline:  Goal status: INITIAL  6.  Patient to report 85% improvement  in overall symptoms Baseline:  Goal status: INITIAL  PLAN:  PT FREQUENCY: 1-2x/week  PT DURATION: 8 weeks  PLANNED INTERVENTIONS: 97110-Therapeutic exercises, 97530- Therapeutic activity, [id number]- Neuromuscular re-education, 97535- Self Care, [id number]- Manual therapy, [phone]- Gait training, [phone]- Aquatic Therapy, [phone]- Electrical stimulation (unattended), [phone]- Electrical stimulation (manual), [id number]- Ultrasound, [id number]- Traction (mechanical), [id number]- Ionotophoresis 4mg /ml Dexamethasone , Patient/Family education, Balance training, Stair training, Taping, Joint mobilization, Spinal mobilization, Cryotherapy, and Moist heat.  PLAN FOR NEXT SESSION: request more insurance visits; assess updated HEP;  continue core strengthening exercises and lumbar mobility; standing posture   Kristeen Sar, PT 05/27/24 4:26 PM [facility] [address], [address] [place], [place] [address] Phone # [phone] Fax [phone]

## 2024-05-29 ENCOUNTER — Other Ambulatory Visit: Payer: Self-pay | Admitting: Family Medicine

## 2024-05-29 DIAGNOSIS — M545 Low back pain, unspecified: Secondary | ICD-10-CM

## 2024-05-29 MED ORDER — TRAMADOL HCL 50 MG PO TABS: 50.0000 mg | ORAL_TABLET | Freq: Four times a day (QID) | ORAL | 0 refills | Status: AC | PRN

## 2024-05-29 NOTE — Telephone Encounter (Signed)
Pt informed

## 2024-06-03 ENCOUNTER — Ambulatory Visit
Admission: EM | Admit: 2024-06-03 | Discharge: 2024-06-03 | Disposition: A | Discharge status: Home / Self Care | Attending: Family Medicine | Admitting: Family Medicine

## 2024-06-03 ENCOUNTER — Ambulatory Visit: Admitting: Physical Therapy

## 2024-06-03 ENCOUNTER — Encounter: Payer: Self-pay | Admitting: Emergency Medicine

## 2024-06-03 DIAGNOSIS — J069 Acute upper respiratory infection, unspecified: Secondary | ICD-10-CM | POA: Diagnosis not present

## 2024-06-03 LAB — POC COVID19/FLU A&B COMBO
Covid Antigen, POC: NEGATIVE
Influenza A Antigen, POC: NEGATIVE
Influenza B Antigen, POC: NEGATIVE

## 2024-06-03 MED ORDER — PROMETHAZINE-DM 6.25-15 MG/5ML PO SYRP: 5.0000 mL | ORAL_SOLUTION | Freq: Four times a day (QID) | ORAL | 0 refills | Status: AC | PRN

## 2024-06-03 MED ORDER — AZELASTINE HCL 0.1 % NA SOLN: 1.0000 | Freq: Two times a day (BID) | NASAL | 0 refills | Status: AC

## 2024-06-03 NOTE — ED Provider Notes (Signed)
 [facility]    CSN: [id number] Arrival date & time: 06/03/24  1741      History   Chief Complaint No chief complaint on file.   HPI Susan Branch is a 28 y.o. female.   Patient presenting today with 3-day history of congestion, cough, scratchy throat, occasional chest tightness.  Denies chest pain, shortness of breath, abdominal pain, vomiting, diarrhea.  So far trying over-the-counter cold and congestion medications, Mucinex  with minimal relief.  Multiple sick contacts recently including a flu exposure.  4 months postpartum, not currently breast-feeding.    Past Medical History:  Diagnosis Date   Allergy    Anxiety    Borderline hypertension    no current med.   Gestational diabetes    Headache    Tonsillar and adenoid hypertrophy 05/2013   snores during sleep, mother denies apnea    Patient Active Problem List   Diagnosis Date Noted   Acute midline low back pain without sciatica 04/16/2024   Previous cesarean delivery affecting pregnancy 01/18/2024   Encounter for induction of labor 08/15/2022   Carrier of spinal muscular atrophy 02/26/2022   Elevated blood pressure reading with diagnosis of hypertension 02/16/2022   GBS bacteriuria 02/16/2022   Supervision of normal first pregnancy 02/13/2022   GAD (generalized anxiety disorder) 10/01/2018   Depression, major, single episode, moderate (HCC) 09/25/2017   Obesity 04/22/2014   Acne vulgaris 04/22/2014   Migraine headache 04/22/2014    Past Surgical History:  Procedure Laterality Date   CESAREAN SECTION N/A 08/15/2022   Procedure: CESAREAN SECTION;  Surgeon: Delana Ted Morrison, DO;  Location: [facility];  Service: Obstetrics;  Laterality: N/A;   CESAREAN SECTION N/A 01/18/2024   Procedure: CESAREAN DELIVERY;  Surgeon: Delana Ted Morrison, DO;  Location: [facility];  Service: Obstetrics;  Laterality: N/A;   CHOLECYSTECTOMY N/A 12/30/2022   Procedure: LAPAROSCOPIC CHOLECYSTECTOMY;  Surgeon: Aron Shoulders, MD;  Location: [facility];  Service: General;  Laterality: N/A;   TONSILLECTOMY AND ADENOIDECTOMY N/A 06/02/2013   Procedure: TONSILLECTOMY AND ADENOIDECTOMY;  Surgeon: Ana LELON Moccasin, MD;  Location: [facility];  Service: ENT;  Laterality: N/A;   TURBINATE REDUCTION Bilateral 06/02/2013   Procedure: TURBINATE REDUCTION BILATERAL;  Surgeon: Ana LELON Moccasin, MD;  Location: [facility];  Service: ENT;  Laterality: Bilateral;   WISDOM TOOTH EXTRACTION     all four 3/24    OB History     Gravida  2   Para  2   Term  2   Preterm  0   AB  0   Living  2      SAB  0   IAB  0   Ectopic  0   Multiple  0   Live Births  2            Home Medications    Prior to Admission medications   Medication Sig Start Date End Date Taking? Authorizing Provider  azelastine  (ASTELIN ) 0.1 % nasal spray Place 1 spray into both nostrils 2 (two) times daily. Use in each nostril as directed 06/03/24  Yes Stuart Vernell Norris, PA-C  promethazine -dextromethorphan (PROMETHAZINE -DM) 6.25-15 MG/5ML syrup Take 5 mLs by mouth 4 (four) times daily as needed. 06/03/24  Yes Stuart Vernell Norris, PA-C  acetaminophen  (TYLENOL ) 500 MG tablet Take 500 mg by mouth every 6 (six) hours as needed.    [provider]  cyclobenzaprine  (FLEXERIL ) 5 MG tablet Take 1-2 tablets (5-10 mg total) by  mouth 3 (three) times daily as needed for muscle spasms. 04/16/24   Zarwolo, Gloria, FNP  Iron , Ferrous Sulfate , 325 (65 Fe) MG TABS Take 325 mg by mouth every morning. 08/18/22   Sarrah Browning, MD  naproxen  (NAPROSYN ) 500 MG tablet Take 1 tablet (500 mg total) by mouth 2 (two) times daily as needed for moderate pain (pain score 4-6). 05/09/24   Zarwolo, Gloria, FNP  NIFEdipine  (ADALAT  CC) 30 MG 24 hr tablet Take 2 tablets (60 mg total) by mouth daily. 01/29/24   Leftwich-Kirby, Olam LABOR, CNM  Prenatal Vit-Fe Fumarate-FA (PRENATAL PO) Take 1 tablet by mouth daily.    [provider]   traMADol  (ULTRAM ) 50 MG tablet Take 1 tablet (50 mg total) by mouth every 6 (six) hours as needed for up to 7 days. 05/29/24 06/05/24  Zarwolo, Gloria, FNP  Vitamin D , Ergocalciferol , (DRISDOL ) 1.25 MG (50000 UNIT) CAPS capsule Take 1 capsule (50,000 Units total) by mouth every 7 (seven) days. 04/28/24   Zarwolo, Gloria, FNP  norgestimate -ethinyl estradiol  (SPRINTEC 28) 0.25-35 MG-MCG tablet Take 1 tablet by mouth daily. 11/04/19 12/22/19  Bari Theodoro FALCON, MD    Family History Family History  Problem Relation Age of Onset   Diabetes Mother    Depression Father    Diabetes Father    Hypertension Father    Heart disease Father        MI   Hyperlipidemia Father    Kidney disease Father    Vision loss Father    Bipolar disorder Father    Vision loss Maternal Grandmother    Stroke Maternal Grandmother    Diabetes Paternal Grandmother    Kidney failure Paternal Grandmother    Diabetes Paternal Grandfather    Kidney failure Paternal Grandfather    Muscular dystrophy Paternal Grandfather     Social History Social History   Tobacco Use   Smoking status: Never    Passive exposure: Yes   Smokeless tobacco: Never  Vaping Use   Vaping status: Never Used  Substance Use Topics   Alcohol use: No   Drug use: Not Currently    Frequency: 4.0 times per week    Types: Marijuana     Allergies   Adhesive [tape], Latex, and Soap   Review of Systems Review of Systems Per HPI  Physical Exam Triage Vital Signs ED Triage Vitals  Encounter Vitals Group     BP 06/03/24 1758 (!) 137/91     Girls Systolic BP Percentile --      Girls Diastolic BP Percentile --      Boys Systolic BP Percentile --      Boys Diastolic BP Percentile --      Pulse Rate 06/03/24 1758 98     Resp 06/03/24 1758 18     Temp 06/03/24 1758 99.3 F (37.4 C)     Temp Source 06/03/24 1758 Oral     SpO2 06/03/24 1758 96 %     Weight --      Height --      Head Circumference --      Peak Flow --      Pain Score  06/03/24 1801 2     Pain Loc --      Pain Education --      Exclude from Growth Chart --    No data found.  Updated Vital Signs BP (!) 137/91 (BP Location: Right Arm)   Pulse 98   Temp 99.3 F (37.4 C) (Oral)  Resp 18   LMP 05/20/2024 (Approximate)   SpO2 96%   Visual Acuity Right Eye Distance:   Left Eye Distance:   Bilateral Distance:    Right Eye Near:   Left Eye Near:    Bilateral Near:     Physical Exam Vitals and nursing note reviewed.  Constitutional:      Appearance: Normal appearance.  HENT:     Head: Atraumatic.     Right Ear: Tympanic membrane and external ear normal.     Left Ear: Tympanic membrane and external ear normal.     Nose: Rhinorrhea present.     Mouth/Throat:     Mouth: Mucous membranes are moist.     Pharynx: Posterior oropharyngeal erythema present.  Eyes:     Extraocular Movements: Extraocular movements intact.     Conjunctiva/sclera: Conjunctivae normal.  Cardiovascular:     Rate and Rhythm: Normal rate and regular rhythm.     Heart sounds: Normal heart sounds.  Pulmonary:     Effort: Pulmonary effort is normal.     Breath sounds: Normal breath sounds. No wheezing or rales.  Musculoskeletal:        General: Normal range of motion.     Cervical back: Normal range of motion and neck supple.  Skin:    General: Skin is warm and dry.  Neurological:     Mental Status: She is alert and oriented to person, place, and time.  Psychiatric:        Mood and Affect: Mood normal.        Thought Content: Thought content normal.      UC Treatments / Results  Labs (all labs ordered are listed, but only abnormal results are displayed) Labs Reviewed  POC COVID19/FLU A&B COMBO    EKG   Radiology No results found.  Procedures Procedures (including critical care time)  Medications Ordered in UC Medications - No data to display  Initial Impression / Assessment and Plan / UC Course  I have reviewed the triage vital signs and the  nursing notes.  Pertinent labs & imaging results that were available during my care of the patient were reviewed by me and considered in my medical decision making (see chart for details).     Vitals and exam reassuring today, suspicious for viral respiratory infection.  Rapid flu and COVID-negative.  Will treat with Phenergan  DM, Astelin , supportive over-the-counter medications at home care.  Return for worsening symptoms.  Final Clinical Impressions(s) / UC Diagnoses   Final diagnoses:  Viral URI with cough   Discharge Instructions   None    ED Prescriptions     Medication Sig Dispense Auth. Provider   promethazine -dextromethorphan (PROMETHAZINE -DM) 6.25-15 MG/5ML syrup Take 5 mLs by mouth 4 (four) times daily as needed. 100 mL Stuart Vernell Norris, PA-C   azelastine  (ASTELIN ) 0.1 % nasal spray Place 1 spray into both nostrils 2 (two) times daily. Use in each nostril as directed 30 mL Stuart Vernell Norris, PA-C      PDMP not reviewed this encounter.   Stuart Vernell Norris, [place] 06/03/24 1849

## 2024-06-03 NOTE — ED Triage Notes (Signed)
 Cough, sore throat, runny nose x 3 days.  States was exposed to flu.

## 2024-06-10 ENCOUNTER — Ambulatory Visit: Admitting: Physical Therapy

## 2024-06-17 ENCOUNTER — Ambulatory Visit: Attending: Obstetrics and Gynecology | Admitting: Physical Therapy

## 2024-06-17 ENCOUNTER — Encounter: Payer: Self-pay | Admitting: Physical Therapy

## 2024-06-17 DIAGNOSIS — R293 Abnormal posture: Secondary | ICD-10-CM | POA: Diagnosis not present

## 2024-06-17 DIAGNOSIS — M5459 Other low back pain: Secondary | ICD-10-CM | POA: Insufficient documentation

## 2024-06-17 DIAGNOSIS — M6281 Muscle weakness (generalized): Secondary | ICD-10-CM | POA: Insufficient documentation

## 2024-06-17 DIAGNOSIS — R252 Cramp and spasm: Secondary | ICD-10-CM | POA: Diagnosis not present

## 2024-06-17 DIAGNOSIS — R262 Difficulty in walking, not elsewhere classified: Secondary | ICD-10-CM | POA: Insufficient documentation

## 2024-06-17 NOTE — Therapy (Signed)
 OUTPATIENT PHYSICAL THERAPY THORACOLUMBAR TREATMENT/ RE-CERTIFICATION   Patient Name: Susan Branch MRN: [id number] DOB:[birth year], 28 y.o., female Today's Date: 06/17/2024  END OF SESSION:  PT End of Session - 06/17/24 1618     Visit Number 6    Date for Recertification  07/31/24    Authorization Type [employer] APPROVED 7 VISITS 04/30/2024 - 06/28/2024 #[id number]    Authorization Time Period 04/30/2024 - 06/28/2024    Authorization - Visit Number 6    Authorization - Number of Visits 7    PT Start Time 1530    PT Stop Time 1611    PT Time Calculation (min) 41 min    Activity Tolerance Patient tolerated treatment well    Behavior During Therapy WFL for tasks assessed/performed              Past Medical History:  Diagnosis Date   Allergy    Anxiety    Borderline hypertension    no current med.   Gestational diabetes    Headache    Tonsillar and adenoid hypertrophy 05/2013   snores during sleep, mother denies apnea   Past Surgical History:  Procedure Laterality Date   CESAREAN SECTION N/A 08/15/2022   Procedure: CESAREAN SECTION;  Surgeon: Delana Ted Morrison, DO;  Location: [facility];  Service: Obstetrics;  Laterality: N/A;   CESAREAN SECTION N/A 01/18/2024   Procedure: CESAREAN DELIVERY;  Surgeon: Delana Ted Morrison, DO;  Location: [facility];  Service: Obstetrics;  Laterality: N/A;   CHOLECYSTECTOMY N/A 12/30/2022   Procedure: LAPAROSCOPIC CHOLECYSTECTOMY;  Surgeon: Aron Shoulders, MD;  Location: [facility];  Service: General;  Laterality: N/A;   TONSILLECTOMY AND ADENOIDECTOMY N/A 06/02/2013   Procedure: TONSILLECTOMY AND ADENOIDECTOMY;  Surgeon: Ana LELON Moccasin, MD;  Location: [facility];  Service: ENT;  Laterality: N/A;   TURBINATE REDUCTION Bilateral 06/02/2013   Procedure: TURBINATE REDUCTION BILATERAL;  Surgeon: Ana LELON Moccasin, MD;  Location: [facility];  Service: ENT;  Laterality: Bilateral;   WISDOM TOOTH EXTRACTION     all four 3/24    Patient Active Problem List   Diagnosis Date Noted   Acute midline low back pain without sciatica 04/16/2024   Previous cesarean delivery affecting pregnancy 01/18/2024   Encounter for induction of labor 08/15/2022   Carrier of spinal muscular atrophy 02/26/2022   Elevated blood pressure reading with diagnosis of hypertension 02/16/2022   GBS bacteriuria 02/16/2022   Supervision of normal first pregnancy 02/13/2022   GAD (generalized anxiety disorder) 10/01/2018   Depression, major, single episode, moderate (HCC) 09/25/2017   Obesity 04/22/2014   Acne vulgaris 04/22/2014   Migraine headache 04/22/2014    PCP: Zarwolo, Gloria, FNP  REFERRING PROVIDER: Delana Ted Morrison, DO  REFERRING DIAG: M54.50 (ICD-10-CM) - Low back pain, unspecified  Rationale for Evaluation and Treatment: Rehabilitation  THERAPY DIAG:  Other low back pain  Muscle weakness (generalized)  Difficulty in walking, not elsewhere classified  Abnormal posture  Cramp and spasm  ONSET DATE: 04/29/2024  SUBJECTIVE:  SUBJECTIVE STATEMENT: Patient reports feeling 60% better since starting therapy. Pain 2/10 today.  From Eval: Patient reports back pain since the beginning of her first pregnancy around 2 years ago.  This pain worsened after her 2nd c-section.  She reports this is worse with standing or bending fwd to care for her children.  She rates her pain at 2/10 with sitting still and as much as 9/10 with activity.  She describes moments of very sharp pain that take her breath.    PERTINENT HISTORY:  2 pregnancies and both C sections in the past 2 years.   PAIN:  Are you having pain? Yes: NPRS scale: 2/10 sitting still now, but up to 9/10 in the past 24 hours Pain location: lumbar Pain description: sharp at times  but generally aching Aggravating factors: standing bending fwd Relieving factors: rest, heat  PRECAUTIONS: None  RED FLAGS: None   WEIGHT BEARING RESTRICTIONS: No  FALLS:  Has patient fallen in last 6 months? 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  DIAGNOSTIC FINDINGS:  none  PATIENT SURVEYS:  Modified Oswestry:  MODIFIED OSWESTRY DISABILITY SCALE  Date: 04/30/24 Score  Pain intensity 4 =  Pain medication provides me with little relief from pain.  2. Personal care (washing, dressing, etc.) 1 =  I can take care of myself normally, but it increases my pain.  3. Lifting 4 = I can lift only very light weights  4. Walking 2 =  Pain prevents me from walking more than  mile.  5. Sitting 1 =  I can only sit in my favorite chair as long as I like.  6. Standing 4 =  Pain prevents me from standing more than 10 minutes.  7. Sleeping 2 =  Even when I take pain medication, I sleep less than 6 hours  8. Social Life 2 = Pain prevents me from participating in more energetic activities (eg. sports, dancing).  9. Traveling 1 =  I can travel anywhere, but it increases my pain.  10. Employment/ Homemaking 1 = My normal homemaking/job activities increase my pain, but I can still perform all that is required of me  Total 22/50   Interpretation of scores: Score Category Description  0-20% Minimal Disability The patient can cope with most living activities. Usually no  treatment is indicated apart from advice on lifting, sitting and exercise  21-40% Moderate Disability The patient experiences more pain and difficulty with sitting, lifting and standing. Travel and social life are more difficult and they may be disabled from work. Personal care, sexual activity and sleeping are not grossly affected, and the patient can usually be managed by conservative means  41-60% Severe Disability Pain remains the main problem in this group, but activities of daily living are affected. These patients require a detailed investigation  61-80% Crippled Back pain impinges on all aspects of the patient's life. Positive intervention is required  81-100% Bed-bound  These patients are either bed-bound or exaggerating their symptoms  Bluford BRAVO, Zoe DELENA Karon DELENA, et al.  Surgery versus conservative management of stable thoracolumbar fracture: the PRESTO feasibility RCT. [place] ([place]): [employer]; 2021 Nov. [facility] Technology Assessment, No. 25.62.) Appendix 3, Oswestry Disability Index category descriptors. Available from: FindJewel[employer].cz  Minimally Clinically Important Difference (MCID) = 12.8%  06/17/2024 ODI 17/50 34%  COGNITION: Overall cognitive status: Within functional limits for tasks assessed     SENSATION: WFL  MUSCLE LENGTH: Hamstrings: Right 45 deg; Left 45 deg Thomas test: Right pos; Left pos  POSTURE: rounded shoulders and decreased lumbar lordosis   LUMBAR ROM:   AROM eval  Flexion WNL  Extension WNL  Right lateral flexion WNL with pain  Left lateral flexion WNL with pain  Right rotation WNL with pain  Left rotation WNL with pain   (Blank rows = not tested)  LOWER EXTREMITY ROM:     WNL  LOWER EXTREMITY MMT:    Generally 4/5 bilateral LE's with exception of bilateral hip ext 4-/5  LUMBAR SPECIAL TESTS:  Straight leg raise test: Negative  FUNCTIONAL TESTS:  05/05/2024 5 times sit to stand: 7.18sec no  UE support Timed up and go (TUG): 6.83sec  GAIT: Distance walked: 50 feet Assistive device utilized: None Level of assistance: Complete Independence Comments: Normal heel to toe progression but antalgic start up  TREATMENT DATE:  06/17/24:  NuStep Level 7 6 min- PT present to discuss status Goal Update ODI: 17/50 34% Hooklying 90/90 toe tap x 20 total Bridge 2 x 10 Sidelying clamshell with red loop 2 x 10 bilateral  Sit to stand 15# KB 2 x 10 Semi tandem + around the worlds 15# KB x 12 each direction 15# KB shoulder height + march x 20 each side Pallof press at cable column 10# x 15 each direction Single arm row + march 2 x 10 bilateral  15# Unilateral farmers carry x 1 lap around both gyms   05/27/24: Patient was late to appointment NuStep Level 7 6 min- PT present to discuss status Review of standing posture and performing small pelvic tilt for neutral alignment Standing chest press with 10# DB Standing chop with 10# DB x 10 each direction Sit to stand holding two 10# DB 2 x 10 Hooklying TA activation + march x 20 bilateral  Hooklying 90/90 toe tap x 20 total Supine LTR x 10 each side 5 sec hold Seated blue stability ball roll out x 5 each direction    05/20/24: Patient was late to appointment Cat Cow x 10 Thread the needle x 8 each side Hooklying opposite hand/knee ball press 2x10 Bridges + ball squeeze 2 x 10 TA activation + SLR 2 x 10 bilateral  Sit to stand 15# 2 x10 Sidelying ball press with hip abduction 2x10 Review of Patient's imaging Lumbar roll outs rt/lt/ct x10 each    05/13/24: NuStep Level 3 5 mins- PT present to discuss status (no arms) 2x10 bridges - mild back pain but with towel roll for transverse abdominis activation feedback no pain Hooklying opposite hand/knee ball press 2x10 Bil knee/hand press x10 Sidelying ball press with hip abduction 2x10 Windshield wipers x10 10s holds Single knee to chest 3x30s each Bird dogs x10 each Cat/cow  x10 Karolynn pose 3x30s Thread the needle 2x30s each Lumbar roll outs rt/lt/ct x10 each Pelvic tilts ant/post 2x10 Pelvic circles x10 each       PATIENT EDUCATION:  Education details: Initiated HEP and educated on proper posture and body mechanics for safe lifting and caring for her newborn and 22 month old.  Person educated: Patient Education method: Explanation, Demonstration, Verbal cues, and Handouts Education comprehension: verbalized understanding, returned demonstration, and verbal cues required  HOME EXERCISE PROGRAM: Access Code: [email or web address] URL: [email or web address] Date: 05/27/2024 Prepared by: Kristeen Sar  Exercises - Standing Hamstring Stretch on Chair  - 1 x daily - 7 x weekly - 1 sets - 3 reps - 30 sec hold - Theatre manager with Chair and Counter Support  - 1 x daily - 7 x weekly - 1 sets - 3 reps - 30 sec hold - Seated Figure 4 Piriformis Stretch  - 1 x daily - 7 x weekly - 1 sets - 3 reps - 30 sed hold - Supine Posterior Pelvic Tilt  - 1 x daily - 7 x weekly - 2 sets - 10 reps - Supine March with Posterior Pelvic Tilt  - 1 x daily - 7 x weekly - 1 sets - 10 reps - Supine Alternating Knee Taps with Hands  - 1 x daily - 7 x weekly - 1 sets - 10 reps - Standing Anti-Rotation Press with Anchored Resistance  - 1 x daily - 7 x weekly - 1 sets - 15 reps - Resistance Pulldown with March  - 1 x daily - 7 x weekly - 1-2 sets - 10 reps - Supine 90/90 Alternating Heel Touches with Posterior Pelvic Tilt  - 1 x daily - 7 x weekly - 2 sets - 10 reps - Supine Dead Bug with Leg Extension  - 1 x daily - 7 x weekly - 2 sets - 5 reps ASSESSMENT:  CLINICAL IMPRESSION: Re-certification completed today. Gilda verbalized feeling 60% better since starting skilled therapy. She has been compliant with HEP and incorporates all education PT provides. She still has back pain when playing outside with her child and with longer grocery runs > 20-30 mins. All short term goals are  met at this time and patient is progressing towards long term goals. Her ODI score has decreased since evaluation and she has been increasing her physical activity. PT provides frequent verbal cues for breathing technique and form exercise performance. Patient would benefit from continued therapy to meet remaining goals and continue strengthening postpartum.     OBJECTIVE IMPAIRMENTS: decreased knowledge of condition, difficulty walking, decreased ROM, decreased strength, increased fascial restrictions, increased muscle spasms, impaired flexibility, improper body mechanics, postural dysfunction, obesity, and pain.   ACTIVITY LIMITATIONS: carrying, lifting, bending, standing, squatting, sleeping, stairs, transfers, bed mobility, bathing, toileting, dressing, hygiene/grooming, and caring for others  PARTICIPATION LIMITATIONS: meal prep, cleaning, laundry, shopping, and community activity  PERSONAL FACTORS: Fitness and 1-2 comorbidities: diabetes, obesity are also affecting patient's functional outcome.   REHAB POTENTIAL: Good  CLINICAL DECISION MAKING: Stable/uncomplicated  EVALUATION COMPLEXITY: Low   GOALS: Goals reviewed with patient? Yes  SHORT TERM GOALS: Target date: 05/28/2024  Pain report to be no greater than 4/10  Baseline: Goal status: MET  2.  Patient will be independent with initial HEP  Baseline:  Goal status: MET    LONG TERM GOALS: Target date: 07/31/2024  Patient to report pain no greater than 2/10  Baseline:  Goal status: IN PROGRESS 06/17/2024  2.  Patient to be independent with advanced HEP  Baseline:  Goal status: IN PROGRESS 06/17/2024  3.  Patient to be able to stand or walk for at least 15 min without back and hip pain to be able to shower and stand to change her childrens diapers Baseline:  Goal status: MET 06/17/2024  4.  Patient  to be able to bend, stoop and squat with pain no greater than 2/10 to be able to dress and bathe her children and do  normal household duties Baseline:  Goal status: IN PROGRESS (still uncomfortable, but form is improving)06/17/2024  5.  ODI to be 12 or less (less than 24%) Baseline:  Goal status: IN PROGRESS (decreased since eval) 06/17/2024  6.  Patient to report 85% improvement in overall symptoms Baseline:  Goal status: IN PROGRESS (60%) 06/17/2024  7.  Patient will be able to walk for at least 30 minutes to be able to grocery shop with minimal back discomfort. Baseline: discomfort at 20 mins Goal status: NEW   PLAN:  PT FREQUENCY: 1-2x/week  PT DURATION: 6 weeks  PLANNED INTERVENTIONS: 97110-Therapeutic exercises, 97530- Therapeutic activity, [id number]- Neuromuscular re-education, 97535- Self Care, [id number]- Manual therapy, [phone]- Gait training, [phone]- Aquatic Therapy, [phone]- Electrical stimulation (unattended), [phone]- Electrical stimulation (manual), [id number]- Ultrasound, [id number]- Traction (mechanical), [id number]- Ionotophoresis 4mg /ml Dexamethasone , Patient/Family education, Balance training, Stair training, Taping, Joint mobilization, Spinal mobilization, Cryotherapy, and Moist heat.  PLAN FOR NEXT SESSION: continue core strengthening exercises and lumbar mobility; standing posture (auth requested 10/8)   Kristeen Sar, PT 06/17/24 4:19 PM [facility] [address] [place], [place] [address] Phone # [phone] Fax [phone]

## 2024-06-24 ENCOUNTER — Encounter: Payer: Self-pay | Admitting: Physical Therapy

## 2024-06-24 ENCOUNTER — Ambulatory Visit: Admitting: Physical Therapy

## 2024-06-24 DIAGNOSIS — M6281 Muscle weakness (generalized): Secondary | ICD-10-CM

## 2024-06-24 DIAGNOSIS — R252 Cramp and spasm: Secondary | ICD-10-CM

## 2024-06-24 DIAGNOSIS — R293 Abnormal posture: Secondary | ICD-10-CM

## 2024-06-24 DIAGNOSIS — R262 Difficulty in walking, not elsewhere classified: Secondary | ICD-10-CM | POA: Diagnosis not present

## 2024-06-24 DIAGNOSIS — M5459 Other low back pain: Secondary | ICD-10-CM | POA: Diagnosis not present

## 2024-06-24 NOTE — Therapy (Signed)
 OUTPATIENT PHYSICAL THERAPY THORACOLUMBAR TREATMENT   Patient Name: Susan Branch MRN: [id number] DOB:1996/06/20, 28 y.o., female Today's Date: 06/24/2024  END OF SESSION:  PT End of Session - 06/24/24 1639     Visit Number 7    Date for Recertification  07/31/24    Authorization Type [employer] APPROVED 7 VISITS 04/30/2024 - 06/28/2024 #[id number]    Authorization Time Period 04/30/2024 - 06/28/2024    Authorization - Visit Number 7    Authorization - Number of Visits 7    PT Start Time 1540    PT Stop Time 1615    PT Time Calculation (min) 35 min    Activity Tolerance Patient tolerated treatment well    Behavior During Therapy WFL for tasks assessed/performed               Past Medical History:  Diagnosis Date   Allergy    Anxiety    Borderline hypertension    no current med.   Gestational diabetes    Headache    Tonsillar and adenoid hypertrophy 05/2013   snores during sleep, mother denies apnea   Past Surgical History:  Procedure Laterality Date   CESAREAN SECTION N/A 08/15/2022   Procedure: CESAREAN SECTION;  Surgeon: Delana Ted Morrison, DO;  Location: [facility];  Service: Obstetrics;  Laterality: N/A;   CESAREAN SECTION N/A 01/18/2024   Procedure: CESAREAN DELIVERY;  Surgeon: Delana Ted Morrison, DO;  Location: [facility];  Service: Obstetrics;  Laterality: N/A;   CHOLECYSTECTOMY N/A 12/30/2022   Procedure: LAPAROSCOPIC CHOLECYSTECTOMY;  Surgeon: Aron Shoulders, MD;  Location: [facility];  Service: General;  Laterality: N/A;   TONSILLECTOMY AND ADENOIDECTOMY N/A 06/02/2013   Procedure: TONSILLECTOMY AND ADENOIDECTOMY;  Surgeon: Ana LELON Moccasin, MD;  Location: [facility];  Service: ENT;  Laterality: N/A;   TURBINATE REDUCTION Bilateral 06/02/2013   Procedure: TURBINATE REDUCTION BILATERAL;  Surgeon: Ana LELON Moccasin, MD;  Location: [facility];  Service: ENT;  Laterality: Bilateral;   WISDOM TOOTH EXTRACTION     all four 3/24   Patient Active  Problem List   Diagnosis Date Noted   Acute midline low back pain without sciatica 04/16/2024   Previous cesarean delivery affecting pregnancy 01/18/2024   Encounter for induction of labor 08/15/2022   Carrier of spinal muscular atrophy 02/26/2022   Elevated blood pressure reading with diagnosis of hypertension 02/16/2022   GBS bacteriuria 02/16/2022   Supervision of normal first pregnancy 02/13/2022   GAD (generalized anxiety disorder) 10/01/2018   Depression, major, single episode, moderate (HCC) 09/25/2017   Obesity 04/22/2014   Acne vulgaris 04/22/2014   Migraine headache 04/22/2014    PCP: Zarwolo, Gloria, FNP  REFERRING PROVIDER: Delana Ted Morrison, DO  REFERRING DIAG: M54.50 (ICD-10-CM) - Low back pain, unspecified  Rationale for Evaluation and Treatment: Rehabilitation  THERAPY DIAG:  Other low back pain  Muscle weakness (generalized)  Difficulty in walking, not elsewhere classified  Abnormal posture  Cramp and spasm  ONSET DATE: 04/29/2024  SUBJECTIVE:  SUBJECTIVE STATEMENT: Patient reports she is doing good today. She is not currently having any pain.  From Eval: Patient reports back pain since the beginning of her first pregnancy around 2 years ago.  This pain worsened after her 2nd c-section.  She reports this is worse with standing or bending fwd to care for her children.  She rates her pain at 2/10 with sitting still and as much as 9/10 with activity.  She describes moments of very sharp pain that take her breath.    PERTINENT HISTORY:  2 pregnancies and both C sections in the past 2 years.   PAIN:  Are you having pain? Yes: NPRS scale: 2/10 sitting still now, but up to 9/10 in the past 24 hours Pain location: lumbar Pain description: sharp at times but generally  aching Aggravating factors: standing bending fwd Relieving factors: rest, heat  PRECAUTIONS: None  RED FLAGS: None   WEIGHT BEARING RESTRICTIONS: No  FALLS:  Has patient fallen in last 6 months? 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  DIAGNOSTIC FINDINGS:  none  PATIENT SURVEYS:  Modified Oswestry:  MODIFIED OSWESTRY DISABILITY SCALE  Date: 04/30/24 Score  Pain intensity 4 =  Pain medication provides me with little relief from pain.  2. Personal care (washing, dressing, etc.) 1 =  I can take care of myself normally, but it increases my pain.  3. Lifting 4 = I can lift only very light weights  4. Walking 2 =  Pain prevents me from walking more than  mile.  5. Sitting 1 =  I can only sit in my favorite chair as long as I like.  6. Standing 4 =  Pain prevents me from standing more than 10 minutes.  7. Sleeping 2 =  Even when I take pain medication, I sleep less than 6 hours  8. Social Life 2 = Pain prevents me from participating in more energetic activities (eg. sports, dancing).  9. Traveling 1 =  I can travel anywhere, but it increases my pain.  10. Employment/ Homemaking 1 = My normal homemaking/job activities increase my pain, but I can still perform all that is required of me  Total 22/50   Interpretation of scores: Score Category Description  0-20% Minimal Disability The patient can cope with most living activities. Usually no treatment is  indicated apart from advice on lifting, sitting and exercise  21-40% Moderate Disability The patient experiences more pain and difficulty with sitting, lifting and standing. Travel and social life are more difficult and they may be disabled from work. Personal care, sexual activity and sleeping are not grossly affected, and the patient can usually be managed by conservative means  41-60% Severe Disability Pain remains the main problem in this group, but activities of daily living are affected. These patients require a detailed investigation  61-80% Crippled Back pain impinges on all aspects of the patient's life. Positive intervention is required  81-100% Bed-bound  These patients are either bed-bound or exaggerating their symptoms  Bluford BRAVO, Zoe DELENA Britain  A, et al. Surgery versus conservative management of stable thoracolumbar fracture: the PRESTO feasibility RCT. [place]): [employer]; 2021 Nov. [facility] Technology Assessment, No. 25.62.) Appendix 3, Oswestry Disability Index category descriptors. Available from: [email or web address].cz  Minimally Clinically Important Difference (MCID) = 12.8%  06/17/2024 ODI 17/50 34%  COGNITION: Overall cognitive status: Within functional limits for tasks assessed     SENSATION: WFL  MUSCLE LENGTH: Hamstrings: Right 45 deg; Left 45 deg Thomas test: Right pos; Left pos  POSTURE: rounded shoulders and decreased lumbar lordosis   LUMBAR ROM:   AROM eval  Flexion WNL  Extension WNL  Right lateral flexion WNL with pain  Left lateral flexion WNL with pain  Right rotation WNL with pain  Left rotation WNL with pain   (Blank rows = not tested)  LOWER EXTREMITY ROM:     WNL  LOWER EXTREMITY MMT:    Generally 4/5 bilateral LE's with exception of bilateral hip ext 4-/5  LUMBAR SPECIAL TESTS:  Straight leg raise test: Negative  FUNCTIONAL TESTS:  05/05/2024 5 times sit to stand: 7.18sec no UE  support Timed up and go (TUG): 6.83sec  GAIT: Distance walked: 50 feet Assistive device utilized: None Level of assistance: Complete Independence Comments: Normal heel to toe progression but antalgic start up  TREATMENT DATE:  06/24/24: Patient was late to appointment Recumbent Bike Level 1- 5 mins PT present to discuss status 3 way stability ball stretch x 10 each side Seated figure four stretch 2 x 30 sec bilateral  Supine hip internal/ external rotation x 10 each side Supine hamstring stretch 2 x 30sec bilateral  Hooklying 90/90 toe tap 2 x 20 total with 6# DB overhead  Bridge 2 x 10 with purple ball in between legs  Side plank x 10 bilateral  Mountain climber off counter 2 x 20  Pallof press at cable column 15# each direction Single arm row at cable column 15# 2 x 10 bilateral  Standing chop x 12 each     06/17/24:  NuStep Level 7 6 min- PT present to discuss status Goal Update ODI: 17/50 34% Hooklying 90/90 toe tap x 20 total Bridge 2 x 10 Sidelying clamshell with red loop 2 x 10 bilateral  Sit to stand 15# KB 2 x 10 Semi tandem + around the worlds 15# KB x 12 each direction 15# KB shoulder height + march x 20 each side Pallof press at cable column 10# x 15 each direction Single arm row + march 2 x 10 bilateral  15# Unilateral farmers carry x 1 lap around both gyms   05/27/24: Patient was late to appointment NuStep Level 7 6 min- PT present to discuss status Review of standing posture and performing small pelvic tilt for neutral alignment Standing chest press with 10# DB Standing chop with 10# DB x 10 each direction Sit to stand holding two 10# DB 2 x 10 Hooklying TA activation + march x 20 bilateral  Hooklying 90/90 toe tap x 20 total Supine LTR x 10 each side 5 sec hold Seated blue stability ball roll out x 5 each direction   PATIENT EDUCATION:  Education details: Initiated HEP and educated on proper posture and body mechanics for safe lifting and caring  for her newborn and 68 month old.   Person educated: Patient Education method: Explanation, Demonstration, Verbal cues, and Handouts Education comprehension: verbalized understanding, returned demonstration, and verbal cues required  HOME EXERCISE PROGRAM: Access Code: [email or web address] URL: [email or web address] Date: 06/24/2024 Prepared by: Zadok Holaway  Quanika Solem  Exercises - Primary school teacher on Chair  - 1 x daily - 7 x weekly - 1 sets - 3 reps - 30 sec hold - Theatre manager with Chair and Counter Support  - 1 x daily - 7 x weekly - 1 sets - 3 reps - 30 sec hold - Seated Figure 4 Piriformis Stretch  - 1 x daily - 7 x weekly - 1 sets - 3 reps - 30 sed hold - Supine Posterior Pelvic Tilt  - 1 x daily - 7 x weekly - 2 sets - 10 reps - Supine March with Posterior Pelvic Tilt  - 1 x daily - 7 x weekly - 1 sets - 10 reps - Supine Alternating Knee Taps with Hands  - 1 x daily - 7 x weekly - 1 sets - 10 reps - Standing Anti-Rotation Press with Anchored Resistance  - 1 x daily - 7 x weekly - 1 sets - 15 reps - Resistance Pulldown with March  - 1 x daily - 7 x weekly - 1-2 sets - 10 reps - Supine 90/90 Alternating Heel Touches with Posterior Pelvic Tilt  - 1 x daily - 7 x weekly - 2 sets - 10 reps - Supine Dead Bug with Leg Extension  - 1 x daily - 7 x weekly - 2 sets - 5 reps - Sidelying Forearm Plank with Knees Bent  - 1 x daily - 7 x weekly - 1 sets - 10 reps - [employer] on Counter  - 1 x daily - 7 x weekly - 2 sets - 10 reps ASSESSMENT:  CLINICAL IMPRESSION: Dorie presents to skilled therapy with no increased pain or discomfort. Treatment session focused on lumbar and hip mobility and core strengthening. Incorporated more oblique work today which was a good challenge for patient. Updated HEP to include exercise progressions. PT monitored patient throughout and provided verbal and visual cues as needed for correct exercise performance. Patient is tolerating core progressions well.  Patient will benefit from skilled PT to address the below impairments and improve overall function.     OBJECTIVE IMPAIRMENTS: decreased knowledge of condition, difficulty walking, decreased ROM, decreased strength, increased fascial restrictions, increased muscle spasms, impaired flexibility, improper body mechanics, postural dysfunction, obesity, and pain.   ACTIVITY LIMITATIONS: carrying, lifting, bending, standing, squatting, sleeping, stairs, transfers, bed mobility, bathing, toileting, dressing, hygiene/grooming, and caring for others  PARTICIPATION LIMITATIONS: meal prep, cleaning, laundry, shopping, and community activity  PERSONAL FACTORS: Fitness and 1-2 comorbidities: diabetes, obesity are also affecting patient's functional outcome.   REHAB POTENTIAL: Good  CLINICAL DECISION MAKING: Stable/uncomplicated  EVALUATION COMPLEXITY: Low   GOALS: Goals reviewed with patient? Yes  SHORT TERM GOALS: Target date: 05/28/2024  Pain report to be no greater than 4/10  Baseline: Goal status: MET  2.  Patient will be independent with initial HEP  Baseline:  Goal status: MET    LONG TERM GOALS: Target date: 07/31/2024  Patient to report pain no greater than 2/10  Baseline:  Goal status: IN PROGRESS 06/17/2024  2.  Patient to be independent with advanced HEP  Baseline:  Goal status: IN PROGRESS 06/17/2024  3.  Patient to be able to stand or walk for at least 15 min without back and hip pain to be able to shower and stand to change her childrens diapers Baseline:  Goal status: MET 06/17/2024  4.  Patient to be able to bend, stoop and squat with pain no greater than 2/10 to be able  to dress and bathe her children and do normal household duties Baseline:  Goal status: IN PROGRESS (still uncomfortable, but form is improving)06/17/2024  5.  ODI to be 12 or less (less than 24%) Baseline:  Goal status: IN PROGRESS (decreased since eval) 06/17/2024  6.  Patient to report 85%  improvement in overall symptoms Baseline:  Goal status: IN PROGRESS (60%) 06/17/2024  7.  Patient will be able to walk for at least 30 minutes to be able to grocery shop with minimal back discomfort. Baseline: discomfort at 20 mins Goal status: NEW   PLAN:  PT FREQUENCY: 1-2x/week  PT DURATION: 6 weeks  PLANNED INTERVENTIONS: 97110-Therapeutic exercises, 97530- Therapeutic activity, [id number]- Neuromuscular re-education, 97535- Self Care, [id number]- Manual therapy, [phone]- Gait training, [phone]- Aquatic Therapy, [phone]- Electrical stimulation (unattended), [phone]- Electrical stimulation (manual), [id number]- Ultrasound, [id number]- Traction (mechanical), [id number]- Ionotophoresis 4mg /ml Dexamethasone , Patient/Family education, Balance training, Stair training, Taping, Joint mobilization, Spinal mobilization, Cryotherapy, and Moist heat.  PLAN FOR NEXT SESSION: update insurance section with new auth (6 visits) continue core strengthening exercises and lumbar mobility; standing posture   Kristeen Sar, PT 06/24/24 4:40 PM [facility] [address], [address] [place], [place] [address] Phone # [phone] Fax [phone]

## 2024-06-30 ENCOUNTER — Ambulatory Visit: Admitting: Physical Therapy

## 2024-07-01 ENCOUNTER — Ambulatory Visit: Admitting: Physical Therapy

## 2024-07-01 ENCOUNTER — Encounter: Admitting: Physical Therapy

## 2024-07-08 ENCOUNTER — Ambulatory Visit

## 2024-07-08 DIAGNOSIS — M6281 Muscle weakness (generalized): Secondary | ICD-10-CM | POA: Diagnosis not present

## 2024-07-08 DIAGNOSIS — R252 Cramp and spasm: Secondary | ICD-10-CM

## 2024-07-08 DIAGNOSIS — R262 Difficulty in walking, not elsewhere classified: Secondary | ICD-10-CM

## 2024-07-08 DIAGNOSIS — M5459 Other low back pain: Secondary | ICD-10-CM

## 2024-07-08 DIAGNOSIS — R293 Abnormal posture: Secondary | ICD-10-CM | POA: Diagnosis not present

## 2024-07-08 NOTE — Therapy (Signed)
 OUTPATIENT PHYSICAL THERAPY THORACOLUMBAR TREATMENT   Patient Name: Susan Branch MRN: [id number] DOB:Dec 21, 1995, 28 y.o., female Today's Date: 07/08/2024  END OF SESSION:  PT End of Session - 07/08/24 1414     Visit Number 8    Date for Recertification  07/31/24    Authorization Type [employer] approved 6 visits 06/29/24-08/27/24 auth#[id number]    Authorization Time Period 06/29/24-08/27/24    Authorization - Visit Number 1    Authorization - Number of Visits 6    PT Start Time 1411    PT Stop Time 1446    PT Time Calculation (min) 35 min    Activity Tolerance Patient tolerated treatment well    Behavior During Therapy [facility] for tasks assessed/performed               Past Medical History:  Diagnosis Date   Allergy    Anxiety    Borderline hypertension    no current med.   Gestational diabetes    Headache    Tonsillar and adenoid hypertrophy 05/2013   snores during sleep, mother denies apnea   Past Surgical History:  Procedure Laterality Date   CESAREAN SECTION N/A 08/15/2022   Procedure: CESAREAN SECTION;  Surgeon: Delana Ted Morrison, DO;  Location: [facility];  Service: Obstetrics;  Laterality: N/A;   CESAREAN SECTION N/A 01/18/2024   Procedure: CESAREAN DELIVERY;  Surgeon: Delana Ted Morrison, DO;  Location: [facility];  Service: Obstetrics;  Laterality: N/A;   CHOLECYSTECTOMY N/A 12/30/2022   Procedure: LAPAROSCOPIC CHOLECYSTECTOMY;  Surgeon: Aron Shoulders, MD;  Location: [facility] OR;  Service: General;  Laterality: N/A;   TONSILLECTOMY AND ADENOIDECTOMY N/A 06/02/2013   Procedure: TONSILLECTOMY AND ADENOIDECTOMY;  Surgeon: Ana LELON Moccasin, MD;  Location: [facility];  Service: ENT;  Laterality: N/A;   TURBINATE REDUCTION Bilateral 06/02/2013   Procedure: TURBINATE REDUCTION BILATERAL;  Surgeon: Ana LELON Moccasin, MD;  Location: [facility];  Service: ENT;  Laterality: Bilateral;   WISDOM TOOTH EXTRACTION     all four 3/24   Patient Active Problem  List   Diagnosis Date Noted   Acute midline low back pain without sciatica 04/16/2024   Previous cesarean delivery affecting pregnancy 01/18/2024   Encounter for induction of labor 08/15/2022   Carrier of spinal muscular atrophy 02/26/2022   Elevated blood pressure reading with diagnosis of hypertension 02/16/2022   GBS bacteriuria 02/16/2022   Supervision of normal first pregnancy 02/13/2022   GAD (generalized anxiety disorder) 10/01/2018   Depression, major, single episode, moderate (HCC) 09/25/2017   Obesity 04/22/2014   Acne vulgaris 04/22/2014   Migraine headache 04/22/2014    PCP: Zarwolo, Gloria, FNP  REFERRING PROVIDER: Delana Ted Morrison, DO  REFERRING DIAG: M54.50 (ICD-10-CM) - Low back pain, unspecified  Rationale for Evaluation and Treatment: Rehabilitation  THERAPY DIAG:  Other low back pain  Muscle weakness (generalized)  Difficulty in walking, not elsewhere classified  Cramp and spasm  Abnormal posture  ONSET DATE: 04/29/2024  SUBJECTIVE:  SUBJECTIVE STATEMENT: Patient reports she is doing good today. Pain 2/10.   From Eval: Patient reports back pain since the beginning of her first pregnancy around 2 years ago.  This pain worsened after her 2nd c-section.  She reports this is worse with standing or bending fwd to care for her children.  She rates her pain at 2/10 with sitting still and as much as 9/10 with activity.  She describes moments of very sharp pain that take her breath.    PERTINENT HISTORY:  2 pregnancies and both C sections in the past 2 years.   PAIN:  Are you having pain? Yes: NPRS scale: 2/10 sitting still now, but up to 8-9/10 at worst since last visit but I was cleaning and cooking all day Pain location: lumbar Pain description: sharp at times but  generally aching Aggravating factors: standing bending fwd Relieving factors: rest, heat  PRECAUTIONS: None  RED FLAGS: None   WEIGHT BEARING RESTRICTIONS: No  FALLS:  Has patient fallen in last 6 months? 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  DIAGNOSTIC FINDINGS:  none  PATIENT SURVEYS:  Modified Oswestry:  MODIFIED OSWESTRY DISABILITY SCALE  Date: 04/30/24 Score  Pain intensity 4 =  Pain medication provides me with little relief from pain.  2. Personal care (washing, dressing, etc.) 1 =  I can take care of myself normally, but it increases my pain.  3. Lifting 4 = I can lift only very light weights  4. Walking 2 =  Pain prevents me from walking more than  mile.  5. Sitting 1 =  I can only sit in my favorite chair as long as I like.  6. Standing 4 =  Pain prevents me from standing more than 10 minutes.  7. Sleeping 2 =  Even when I take pain medication, I sleep less than 6 hours  8. Social Life 2 = Pain prevents me from participating in more energetic activities (eg. sports, dancing).  9. Traveling 1 =  I can travel anywhere, but it increases my pain.  10. Employment/ Homemaking 1 = My normal homemaking/job activities increase my pain, but I can still perform all that is required of me  Total 22/50   Interpretation of scores: Score Category Description  0-20% Minimal Disability The patient can cope with most living activities. Usually no  treatment is indicated apart from advice on lifting, sitting and exercise  21-40% Moderate Disability The patient experiences more pain and difficulty with sitting, lifting and standing. Travel and social life are more difficult and they may be disabled from work. Personal care, sexual activity and sleeping are not grossly affected, and the patient can usually be managed by conservative means  41-60% Severe Disability Pain remains the main problem in this group, but activities of daily living are affected. These patients require a detailed investigation  61-80% Crippled Back pain impinges on all aspects of the patient's life. Positive intervention is required  81-100% Bed-bound  These patients are either bed-bound or exaggerating their symptoms  Bluford  E, Scantlebury A, Booth A, et al. Surgery versus conservative management of stable thoracolumbar fracture: the PRESTO feasibility RCT. [place] ([place]): [employer]; 2021 Nov. [facility] Technology Assessment, No. 25.62.) Appendix 3, Oswestry Disability Index category descriptors. Available from: [email or web address].cz  Minimally Clinically Important Difference (MCID) = 12.8%  06/17/2024 ODI 17/50 34%  COGNITION: Overall cognitive status: Within functional limits for tasks assessed     SENSATION: WFL  MUSCLE LENGTH: Hamstrings: Right 45 deg; Left 45 deg Thomas test: Right pos; Left pos  POSTURE: rounded shoulders and decreased lumbar lordosis   LUMBAR ROM:   AROM eval  Flexion WNL  Extension WNL  Right lateral flexion WNL with pain  Left lateral flexion WNL with pain  Right rotation WNL with pain  Left rotation WNL with pain   (Blank rows = not tested)  LOWER EXTREMITY ROM:     WNL  LOWER EXTREMITY MMT:    Generally 4/5 bilateral LE's with exception of bilateral hip ext 4-/5  LUMBAR SPECIAL TESTS:  Straight leg raise test: Negative  FUNCTIONAL TESTS:  05/05/2024 5 times sit to stand: 7.18sec no  UE support Timed up and go (TUG): 6.83sec  GAIT: Distance walked: 50 feet Assistive device utilized: None Level of assistance: Complete Independence Comments: Normal heel to toe progression but antalgic start up  TREATMENT DATE:  07/08/24: Patient was 10 min late to appointment Recumbent Bike Level 5- 5 mins PT present to discuss status 3 way stability ball stretch x 10 each side Seated figure four stretch 2 x 30 sec bilateral  Supine hip internal/ external rotation x 5 each side holding 10 sec each Supine hamstring stretch 2 x 30sec bilateral  Hooklying 90/90 toe tap 2 x 20 total with 6# DB overhead  Bridge 2 x 10 with purple ball in between legs  Dying bug x 20 unsupported Side plank x 10 bilateral  Mountain climber on table 2 x 20  Pallof press at cable column blue tband with handles each direction Single arm row using blue tband with handles 2 x 10 bilateral  Standing chop x 12 each with 15 lbs    06/24/24: Patient was late to appointment Recumbent Bike Level 1- 5 mins PT present to discuss status 3 way stability ball stretch x 10 each side Seated figure four stretch 2 x 30 sec bilateral  Supine hip internal/ external rotation x 10 each side Supine hamstring stretch 2 x 30sec bilateral  Hooklying 90/90 toe tap 2 x 20 total with 6# DB overhead  Bridge 2 x 10 with purple ball in between legs  Side plank x 10 bilateral  Mountain climber off counter 2 x 20  Pallof press at cable column 15# each direction Single arm row at cable column 15# 2 x 10 bilateral  Standing chop x 12 each     06/17/24:  NuStep Level 7 6 min- PT present to discuss status Goal Update ODI: 17/50 34% Hooklying 90/90 toe tap x 20 total Bridge 2 x 10 Sidelying clamshell with red loop 2 x 10 bilateral  Sit to stand 15# KB 2 x 10 Semi tandem + around the worlds 15# KB x 12 each direction 15# KB shoulder height + march x 20 each side Pallof press at cable column 10# x 15 each direction Single arm  row + march 2 x 10 bilateral  15# Unilateral farmers carry x 1 lap around both gyms   PATIENT EDUCATION:  Education details: Initiated HEP and educated on proper posture and  body mechanics for safe lifting and caring for her newborn and 45 month old.   Person educated: Patient Education method: Explanation, Demonstration, Verbal cues, and Handouts Education comprehension: verbalized understanding, returned demonstration, and verbal cues required  HOME EXERCISE PROGRAM: Access Code: [email or web address] URL: [email or web address] Date: 06/24/2024 Prepared by: Kristeen Sar  Exercises - Standing Hamstring Stretch on Chair  - 1 x daily - 7 x weekly - 1 sets - 3 reps - 30 sec hold - Theatre Manager with Chair and Counter Support  - 1 x daily - 7 x weekly - 1 sets - 3 reps - 30 sec hold - Seated Figure 4 Piriformis Stretch  - 1 x daily - 7 x weekly - 1 sets - 3 reps - 30 sed hold - Supine Posterior Pelvic Tilt  - 1 x daily - 7 x weekly - 2 sets - 10 reps - Supine March with Posterior Pelvic Tilt  - 1 x daily - 7 x weekly - 1 sets - 10 reps - Supine Alternating Knee Taps with Hands  - 1 x daily - 7 x weekly - 1 sets - 10 reps - Standing Anti-Rotation Press with Anchored Resistance  - 1 x daily - 7 x weekly - 1 sets - 15 reps - Resistance Pulldown with March  - 1 x daily - 7 x weekly - 1-2 sets - 10 reps - Supine 90/90 Alternating Heel Touches with Posterior Pelvic Tilt  - 1 x daily - 7 x weekly - 2 sets - 10 reps - Supine Dead Bug with Leg Extension  - 1 x daily - 7 x weekly - 2 sets - 5 reps - Sidelying Forearm Plank with Knees Bent  - 1 x daily - 7 x weekly - 1 sets - 10 reps - [employer] on Counter  - 1 x daily - 7 x weekly - 2 sets - 10 reps ASSESSMENT:  CLINICAL IMPRESSION: Malena is progressing appropriately.   She was able to do all core work unsupported with good form and no increased pain.  Patient will benefit from skilled PT to address the below impairments and improve  overall function.  OBJECTIVE IMPAIRMENTS: decreased knowledge of condition, difficulty walking, decreased ROM, decreased strength, increased fascial restrictions, increased muscle spasms, impaired flexibility, improper body mechanics, postural dysfunction, obesity, and pain.   ACTIVITY LIMITATIONS: carrying, lifting, bending, standing, squatting, sleeping, stairs, transfers, bed mobility, bathing, toileting, dressing, hygiene/grooming, and caring for others  PARTICIPATION LIMITATIONS: meal prep, cleaning, laundry, shopping, and community activity  PERSONAL FACTORS: Fitness and 1-2 comorbidities: diabetes, obesity are also affecting patient's functional outcome.   REHAB POTENTIAL: Good  CLINICAL DECISION MAKING: Stable/uncomplicated  EVALUATION COMPLEXITY: Low   GOALS: Goals reviewed with patient? Yes  SHORT TERM GOALS: Target date: 05/28/2024  Pain report to be no greater than 4/10  Baseline: Goal status: MET  2.  Patient will be independent with initial HEP  Baseline:  Goal status: MET    LONG TERM GOALS: Target date: 07/31/2024  Patient to report pain no greater than 2/10  Baseline:  Goal status: IN PROGRESS 06/17/2024  2.  Patient to be independent with advanced HEP  Baseline:  Goal status: IN PROGRESS 06/17/2024  3.  Patient to be able to stand or walk for at least 15 min without back and hip pain to be able to shower and stand to change her childrens diapers Baseline:  Goal status: MET 06/17/2024  4.  Patient to be able to bend, stoop and squat  with pain no greater than 2/10 to be able to dress and bathe her children and do normal household duties Baseline:  Goal status: IN PROGRESS (still uncomfortable, but form is improving)06/17/2024  5.  ODI to be 12 or less (less than 24%) Baseline:  Goal status: IN PROGRESS (decreased since eval) 06/17/2024  6.  Patient to report 85% improvement in overall symptoms Baseline:  Goal status: IN PROGRESS (60%) 06/17/2024  7.   Patient will be able to walk for at least 30 minutes to be able to grocery shop with minimal back discomfort. Baseline: discomfort at 20 mins Goal status: NEW   PLAN:  PT FREQUENCY: 1-2x/week  PT DURATION: 6 weeks  PLANNED INTERVENTIONS: 97110-Therapeutic exercises, 97530- Therapeutic activity, [id number]- Neuromuscular re-education, 97535- Self Care, [id number]- Manual therapy, [phone]- Gait training, [phone]- Aquatic Therapy, [phone]- Electrical stimulation (unattended), [phone]- Electrical stimulation (manual), [id number]- Ultrasound, [id number]- Traction (mechanical), [id number]- Ionotophoresis 4mg /ml Dexamethasone , Patient/Family education, Balance training, Stair training, Taping, Joint mobilization, Spinal mobilization, Cryotherapy, and Moist heat.  PLAN FOR NEXT SESSION: Core strengthening, lumbar mobility; standing posture, flexibility   Maragret Vanacker B. Donnielle Addison, PT 07/08/24 2:48 PM [facility] [address] [place], [place] [address] Phone # [phone] Fax [phone]

## 2024-07-14 ENCOUNTER — Ambulatory Visit: Attending: Obstetrics and Gynecology | Admitting: Physical Therapy

## 2024-07-14 ENCOUNTER — Encounter: Payer: Self-pay | Admitting: Physical Therapy

## 2024-07-14 DIAGNOSIS — M6281 Muscle weakness (generalized): Secondary | ICD-10-CM | POA: Insufficient documentation

## 2024-07-14 DIAGNOSIS — R293 Abnormal posture: Secondary | ICD-10-CM | POA: Insufficient documentation

## 2024-07-14 DIAGNOSIS — M5459 Other low back pain: Secondary | ICD-10-CM | POA: Diagnosis not present

## 2024-07-14 DIAGNOSIS — R262 Difficulty in walking, not elsewhere classified: Secondary | ICD-10-CM | POA: Insufficient documentation

## 2024-07-14 DIAGNOSIS — R252 Cramp and spasm: Secondary | ICD-10-CM | POA: Diagnosis not present

## 2024-07-14 NOTE — Therapy (Signed)
 OUTPATIENT PHYSICAL THERAPY THORACOLUMBAR TREATMENT   Patient Name: Susan Branch MRN: [id number] DOB:01-03-1996, 28 y.o., female Today's Date: 07/14/2024  END OF SESSION:  PT End of Session - 07/14/24 1534     Visit Number 9    Date for Recertification  07/31/24    Authorization Type [employer] approved 6 visits 06/29/24-08/27/24 auth#[id number]    Authorization Time Period 06/29/24-08/27/24    Authorization - Visit Number 2    Authorization - Number of Visits 6    PT Start Time 1451    PT Stop Time 1531    PT Time Calculation (min) 40 min    Activity Tolerance Patient tolerated treatment well    Behavior During Therapy WFL for tasks assessed/performed                Past Medical History:  Diagnosis Date   Allergy    Anxiety    Borderline hypertension    no current med.   Gestational diabetes    Headache    Tonsillar and adenoid hypertrophy 05/2013   snores during sleep, mother denies apnea   Past Surgical History:  Procedure Laterality Date   CESAREAN SECTION N/A 08/15/2022   Procedure: CESAREAN SECTION;  Surgeon: Delana Ted Morrison, DO;  Location: [facility];  Service: Obstetrics;  Laterality: N/A;   CESAREAN SECTION N/A 01/18/2024   Procedure: CESAREAN DELIVERY;  Surgeon: Delana Ted Morrison, DO;  Location: [facility];  Service: Obstetrics;  Laterality: N/A;   CHOLECYSTECTOMY N/A 12/30/2022   Procedure: LAPAROSCOPIC CHOLECYSTECTOMY;  Surgeon: Aron Shoulders, MD;  Location: [facility];  Service: General;  Laterality: N/A;   TONSILLECTOMY AND ADENOIDECTOMY N/A 06/02/2013   Procedure: TONSILLECTOMY AND ADENOIDECTOMY;  Surgeon: Ana LELON Moccasin, MD;  Location: [facility];  Service: ENT;  Laterality: N/A;   TURBINATE REDUCTION Bilateral 06/02/2013   Procedure: TURBINATE REDUCTION BILATERAL;  Surgeon: Ana LELON Moccasin, MD;  Location: [facility];  Service: ENT;  Laterality: Bilateral;   WISDOM TOOTH EXTRACTION     all four 3/24   Patient Active Problem  List   Diagnosis Date Noted   Acute midline low back pain without sciatica 04/16/2024   Previous cesarean delivery affecting pregnancy 01/18/2024   Encounter for induction of labor 08/15/2022   Carrier of spinal muscular atrophy 02/26/2022   Elevated blood pressure reading with diagnosis of hypertension 02/16/2022   GBS bacteriuria 02/16/2022   Supervision of normal first pregnancy 02/13/2022   GAD (generalized anxiety disorder) 10/01/2018   Depression, major, single episode, moderate (HCC) 09/25/2017   Obesity 04/22/2014   Acne vulgaris 04/22/2014   Migraine headache 04/22/2014    PCP: Zarwolo, Gloria, FNP  REFERRING PROVIDER: Delana Ted Morrison, DO  REFERRING DIAG: M54.50 (ICD-10-CM) - Low back pain, unspecified  Rationale for Evaluation and Treatment: Rehabilitation  THERAPY DIAG:  Other low back pain  Muscle weakness (generalized)  Difficulty in walking, not elsewhere classified  Cramp and spasm  Abnormal posture  ONSET DATE: 04/29/2024  SUBJECTIVE:  SUBJECTIVE STATEMENT: Patient reports she is okay today. Pain is 2/10.   From Eval: Patient reports back pain since the beginning of her first pregnancy around 2 years ago.  This pain worsened after her 2nd c-section.  She reports this is worse with standing or bending fwd to care for her children.  She rates her pain at 2/10 with sitting still and as much as 9/10 with activity.  She describes moments of very sharp pain that take her breath.    PERTINENT HISTORY:  2 pregnancies and both C sections in the past 2 years.   PAIN:  Are you having pain? Yes: NPRS scale: 2/10 sitting still now, but up to 8-9/10 at worst since last visit but I was cleaning and cooking all day Pain location: lumbar Pain description: sharp at times but  generally aching Aggravating factors: standing bending fwd Relieving factors: rest, heat  PRECAUTIONS: None  RED FLAGS: None   WEIGHT BEARING RESTRICTIONS: No  FALLS:  Has patient fallen in last 6 months? 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  DIAGNOSTIC FINDINGS:  none  PATIENT SURVEYS:  Modified Oswestry:  MODIFIED OSWESTRY DISABILITY SCALE  Date: 04/30/24 Score  Pain intensity 4 =  Pain medication provides me with little relief from pain.  2. Personal care (washing, dressing, etc.) 1 =  I can take care of myself normally, but it increases my pain.  3. Lifting 4 = I can lift only very light weights  4. Walking 2 =  Pain prevents me from walking more than  mile.  5. Sitting 1 =  I can only sit in my favorite chair as long as I like.  6. Standing 4 =  Pain prevents me from standing more than 10 minutes.  7. Sleeping 2 =  Even when I take pain medication, I sleep less than 6 hours  8. Social Life 2 = Pain prevents me from participating in more energetic activities (eg. sports, dancing).  9. Traveling 1 =  I can travel anywhere, but it increases my pain.  10. Employment/ Homemaking 1 = My normal homemaking/job activities increase my pain, but I can still perform all that is required of me  Total 22/50   Interpretation of scores: Score Category Description  0-20% Minimal Disability The patient can cope with most living activities. Usually no  treatment is indicated apart from advice on lifting, sitting and exercise  21-40% Moderate Disability The patient experiences more pain and difficulty with sitting, lifting and standing. Travel and social life are more difficult and they may be disabled from work. Personal care, sexual activity and sleeping are not grossly affected, and the patient can usually be managed by conservative means  41-60% Severe Disability Pain remains the main problem in this group, but activities of daily living are affected. These patients require a detailed investigation  61-80% Crippled Back pain impinges on all aspects of the patient's life. Positive intervention is required  81-100% Bed-bound  These patients are either bed-bound or exaggerating their symptoms  Bluford  E, Scantlebury A, Booth A, et al. Surgery versus conservative management of stable thoracolumbar fracture: the PRESTO feasibility RCT. [place] ([place]): [employer]; 2021 Nov. [facility] Technology Assessment, No. 25.62.) Appendix 3, Oswestry Disability Index category descriptors. Available from: Findjewelers.cz  Minimally Clinically Important Difference (MCID) = 12.8%  06/17/2024 ODI 17/50 34%  COGNITION: Overall cognitive status: Within functional limits for tasks assessed     SENSATION: WFL  MUSCLE LENGTH: Hamstrings: Right 45 deg; Left 45 deg Thomas test: Right pos; Left pos  POSTURE: rounded shoulders and decreased lumbar lordosis   LUMBAR ROM:   AROM eval  Flexion WNL  Extension WNL  Right lateral flexion WNL with pain  Left lateral flexion WNL with pain  Right rotation WNL with pain  Left rotation WNL with pain   (Blank rows = not tested)  LOWER EXTREMITY ROM:     WNL  LOWER EXTREMITY MMT:    Generally 4/5 bilateral LE's with exception of bilateral hip ext 4-/5  LUMBAR SPECIAL TESTS:  Straight leg raise test: Negative  FUNCTIONAL TESTS:  05/05/2024 5 times sit to stand: 7.18sec no  UE support Timed up and go (TUG): 6.83sec  GAIT: Distance walked: 50 feet Assistive device utilized: None Level of assistance: Complete Independence Comments: Normal heel to toe progression but antalgic start up  TREATMENT DATE:  07/14/24:  NuStep Level 7 - PT present to discuss status Pallof press with blue TB x 12 each direction Trunk rotation with blue TB x 12 each direction Iso pallof press + march x 12 each direction TA activation + shoulder extension with blue TB 2 x 10 Sitting on blue stability ball: LAW x 20 ; March x 20 ; alt hand and knee raise x 20 Single leg RDL with 10# KB 2 x 10 bilateral  3 way stability ball stretch x 10 each side Quadruped arm lifts x 5 each then LE extension x 5 each Bird Dog x 5 each Hooklying figure four 2 x 30 sec bilateral  Supine hip internal/ external rotation x 5 each side holding 10 sec each Single knee to chest 2 x 30 sec bilateral  Supine hamstring stretch 2 x 30sec bilateral     07/08/24: Patient was 10 min late to appointment Recumbent Bike Level 5- 5 mins PT present to discuss status 3 way stability ball stretch x 10 each side Seated figure four stretch 2 x 30 sec bilateral  Supine hip internal/ external rotation x 5 each side holding 10 sec each Supine hamstring stretch 2 x 30sec bilateral  Hooklying 90/90 toe tap 2 x 20 total with 6# DB overhead  Bridge 2 x 10 with purple ball in between legs  Dying bug x 20 unsupported Side plank x 10 bilateral  Mountain climber on table 2 x 20  Pallof press at cable column blue tband with handles each direction Single arm row using blue tband with handles 2 x 10 bilateral  Standing chop x 12 each with 15 lbs    06/24/24: Patient was late to appointment Recumbent Bike Level 1- 5 mins PT present to discuss status 3 way stability ball stretch x 10 each side Seated figure four stretch 2 x 30 sec bilateral  Supine hip internal/ external rotation x 10 each side Supine hamstring  stretch 2 x 30sec bilateral  Hooklying 90/90 toe tap 2 x 20 total with 6# DB overhead  Bridge 2 x 10 with purple ball in between legs  Side plank x 10 bilateral  [facility]  climber off counter 2 x 20  Pallof press at cable column 15# each direction Single arm row at cable column 15# 2 x 10 bilateral  Standing chop x 12 each    PATIENT EDUCATION:  Education details: Initiated HEP and educated on proper posture and body mechanics for safe lifting and caring for her newborn and 21 month old.   Person educated: Patient Education method: Explanation, Demonstration, Verbal cues, and Handouts Education comprehension: verbalized understanding, returned demonstration, and verbal cues required  HOME EXERCISE PROGRAM: Access Code: [email or web address] URL: [email or web address] Date: 06/24/2024 Prepared by: Kristeen Sar  Exercises - Standing Hamstring Stretch on Chair  - 1 x daily - 7 x weekly - 1 sets - 3 reps - 30 sec hold - Theatre Manager with Chair and Counter Support  - 1 x daily - 7 x weekly - 1 sets - 3 reps - 30 sec hold - Seated Figure 4 Piriformis Stretch  - 1 x daily - 7 x weekly - 1 sets - 3 reps - 30 sed hold - Supine Posterior Pelvic Tilt  - 1 x daily - 7 x weekly - 2 sets - 10 reps - Supine March with Posterior Pelvic Tilt  - 1 x daily - 7 x weekly - 1 sets - 10 reps - Supine Alternating Knee Taps with Hands  - 1 x daily - 7 x weekly - 1 sets - 10 reps - Standing Anti-Rotation Press with Anchored Resistance  - 1 x daily - 7 x weekly - 1 sets - 15 reps - Resistance Pulldown with March  - 1 x daily - 7 x weekly - 1-2 sets - 10 reps - Supine 90/90 Alternating Heel Touches with Posterior Pelvic Tilt  - 1 x daily - 7 x weekly - 2 sets - 10 reps - Supine Dead Bug with Leg Extension  - 1 x daily - 7 x weekly - 2 sets - 5 reps - Sidelying Forearm Plank with Knees Bent  - 1 x daily - 7 x weekly - 1 sets - 10 reps - [employer] on Counter  - 1 x daily - 7 x weekly - 2 sets - 10  reps ASSESSMENT:  CLINICAL IMPRESSION: Progressed core strengthening today to an unstable surface. This was a good challenge for patient. With single leg rdls patient required verbal and visual cues for correct performance. She felt some discomfort in her lumbar spine, but when decreasing ROM until when she felt a stretch in her glutes pain decreased. Overall, she is progressing very well with skilled therapy and is on track to meet goals at end of POC.  OBJECTIVE IMPAIRMENTS: decreased knowledge of condition, difficulty walking, decreased ROM, decreased strength, increased fascial restrictions, increased muscle spasms, impaired flexibility, improper body mechanics, postural dysfunction, obesity, and pain.   ACTIVITY LIMITATIONS: carrying, lifting, bending, standing, squatting, sleeping, stairs, transfers, bed mobility, bathing, toileting, dressing, hygiene/grooming, and caring for others  PARTICIPATION LIMITATIONS: meal prep, cleaning, laundry, shopping, and community activity  PERSONAL FACTORS: Fitness and 1-2 comorbidities: diabetes, obesity are also affecting patient's functional outcome.   REHAB POTENTIAL: Good  CLINICAL DECISION MAKING: Stable/uncomplicated  EVALUATION COMPLEXITY: Low   GOALS: Goals reviewed with patient? Yes  SHORT TERM GOALS: Target date: 05/28/2024  Pain report to be no greater than 4/10  Baseline: Goal status: MET  2.  Patient will be independent with initial HEP  Baseline:  Goal status: MET    LONG TERM GOALS: Target date: 07/31/2024  Patient to report pain  no greater than 2/10  Baseline:  Goal status: IN PROGRESS 06/17/2024  2.  Patient to be independent with advanced HEP  Baseline:  Goal status: IN PROGRESS 06/17/2024  3.  Patient to be able to stand or walk for at least 15 min without back and hip pain to be able to shower and stand to change her childrens diapers Baseline:  Goal status: MET 06/17/2024  4.  Patient to be able to bend, stoop  and squat with pain no greater than 2/10 to be able to dress and bathe her children and do normal household duties Baseline:  Goal status: IN PROGRESS (still uncomfortable, but form is improving)06/17/2024  5.  ODI to be 12 or less (less than 24%) Baseline:  Goal status: IN PROGRESS (decreased since eval) 06/17/2024  6.  Patient to report 85% improvement in overall symptoms Baseline:  Goal status: IN PROGRESS (60%) 06/17/2024  7.  Patient will be able to walk for at least 30 minutes to be able to grocery shop with minimal back discomfort. Baseline: discomfort at 20 mins Goal status: NEW   PLAN:  PT FREQUENCY: 1-2x/week  PT DURATION: 6 weeks  PLANNED INTERVENTIONS: 97110-Therapeutic exercises, 97530- Therapeutic activity, [id number]- Neuromuscular re-education, 97535- Self Care, [id number]- Manual therapy, [phone]- Gait training, [phone]- Aquatic Therapy, [phone]- Electrical stimulation (unattended), [phone]- Electrical stimulation (manual), [id number]- Ultrasound, [id number]- Traction (mechanical), [id number]- Ionotophoresis 4mg /ml Dexamethasone , Patient/Family education, Balance training, Stair training, Taping, Joint mobilization, Spinal mobilization, Cryotherapy, and Moist heat.  PLAN FOR NEXT SESSION: Core strengthening, lumbar mobility; standing posture, flexibility   Kristeen Sar, PT, DPT 07/14/24 3:35 PM [facility] [address], [address] [place], [place] [address] Phone # [phone] Fax [phone]

## 2024-07-16 ENCOUNTER — Encounter: Admitting: Physical Therapy

## 2024-07-21 ENCOUNTER — Telehealth: Payer: Self-pay | Admitting: Physical Therapy

## 2024-07-21 ENCOUNTER — Ambulatory Visit: Admitting: Physical Therapy

## 2024-07-21 NOTE — Telephone Encounter (Signed)
 Spoke to patient about missed appointment. She got her appointment days missed up. She confirmed next scheduled appointment.  Kristeen Sar, PT, DPT 07/21/24 3:18 PM

## 2024-07-23 ENCOUNTER — Ambulatory Visit: Admitting: Physical Therapy

## 2024-07-23 ENCOUNTER — Encounter: Payer: Self-pay | Admitting: Physical Therapy

## 2024-07-23 DIAGNOSIS — R293 Abnormal posture: Secondary | ICD-10-CM

## 2024-07-23 DIAGNOSIS — R252 Cramp and spasm: Secondary | ICD-10-CM | POA: Diagnosis not present

## 2024-07-23 DIAGNOSIS — R262 Difficulty in walking, not elsewhere classified: Secondary | ICD-10-CM

## 2024-07-23 DIAGNOSIS — M6281 Muscle weakness (generalized): Secondary | ICD-10-CM

## 2024-07-23 DIAGNOSIS — M5459 Other low back pain: Secondary | ICD-10-CM

## 2024-07-23 NOTE — Therapy (Signed)
 OUTPATIENT PHYSICAL THERAPY THORACOLUMBAR TREATMENT   Patient Name: Susan Branch MRN: [id number] DOB:1995/12/14, 28 y.o., female Today's Date: 07/23/2024  END OF SESSION:  PT End of Session - 07/23/24 1530     Visit Number 10    Date for Recertification  07/31/24    Authorization Type [employer] approved 6 visits 06/29/24-08/27/24 auth#[id number]    Authorization Time Period 06/29/24-08/27/24    Authorization - Visit Number 3    Authorization - Number of Visits 6    PT Start Time 1407    PT Stop Time 1455    PT Time Calculation (min) 48 min    Activity Tolerance Patient tolerated treatment well    Behavior During Therapy [facility] for tasks assessed/performed                 Past Medical History:  Diagnosis Date   Allergy    Anxiety    Borderline hypertension    no current med.   Gestational diabetes    Headache    Tonsillar and adenoid hypertrophy 05/2013   snores during sleep, mother denies apnea   Past Surgical History:  Procedure Laterality Date   CESAREAN SECTION N/A 08/15/2022   Procedure: CESAREAN SECTION;  Surgeon: Delana Ted Morrison, DO;  Location: [facility];  Service: Obstetrics;  Laterality: N/A;   CESAREAN SECTION N/A 01/18/2024   Procedure: CESAREAN DELIVERY;  Surgeon: Delana Ted Morrison, DO;  Location: [facility];  Service: Obstetrics;  Laterality: N/A;   CHOLECYSTECTOMY N/A 12/30/2022   Procedure: LAPAROSCOPIC CHOLECYSTECTOMY;  Surgeon: Aron Shoulders, MD;  Location: [facility] OR;  Service: General;  Laterality: N/A;   TONSILLECTOMY AND ADENOIDECTOMY N/A 06/02/2013   Procedure: TONSILLECTOMY AND ADENOIDECTOMY;  Surgeon: Ana LELON Moccasin, MD;  Location: [facility];  Service: ENT;  Laterality: N/A;   TURBINATE REDUCTION Bilateral 06/02/2013   Procedure: TURBINATE REDUCTION BILATERAL;  Surgeon: Ana LELON Moccasin, MD;  Location: [facility];  Service: ENT;  Laterality: Bilateral;   WISDOM TOOTH EXTRACTION     all four 3/24   Patient Active  Problem List   Diagnosis Date Noted   Acute midline low back pain without sciatica 04/16/2024   Previous cesarean delivery affecting pregnancy 01/18/2024   Encounter for induction of labor 08/15/2022   Carrier of spinal muscular atrophy 02/26/2022   Elevated blood pressure reading with diagnosis of hypertension 02/16/2022   GBS bacteriuria 02/16/2022   Supervision of normal first pregnancy 02/13/2022   GAD (generalized anxiety disorder) 10/01/2018   Depression, major, single episode, moderate (HCC) 09/25/2017   Obesity 04/22/2014   Acne vulgaris 04/22/2014   Migraine headache 04/22/2014    PCP: Zarwolo, Gloria, FNP  REFERRING PROVIDER: Delana Ted Morrison, DO  REFERRING DIAG: M54.50 (ICD-10-CM) - Low back pain, unspecified  Rationale for Evaluation and Treatment: Rehabilitation  THERAPY DIAG:  Other low back pain  Muscle weakness (generalized)  Difficulty in walking, not elsewhere classified  Cramp and spasm  Abnormal posture  ONSET DATE: 04/29/2024  SUBJECTIVE:  SUBJECTIVE STATEMENT: Patient reports she has had a little more back pain this week with day to day activities. She does not currently have any pain.  From Eval: Patient reports back pain since the beginning of her first pregnancy around 2 years ago.  This pain worsened after her 2nd c-section.  She reports this is worse with standing or bending fwd to care for her children.  She rates her pain at 2/10 with sitting still and as much as 9/10 with activity.  She describes moments of very sharp pain that take her breath.    PERTINENT HISTORY:  2 pregnancies and both C sections in the past 2 years.   PAIN:  Are you having pain? Yes: NPRS scale: 2/10 sitting still now, but up to 8-9/10 at worst since last visit but I was cleaning  and cooking all day Pain location: lumbar Pain description: sharp at times but generally aching Aggravating factors: standing bending fwd Relieving factors: rest, heat  PRECAUTIONS: None  RED FLAGS: None   WEIGHT BEARING RESTRICTIONS: No  FALLS:  Has patient fallen in last 6 months? 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  DIAGNOSTIC FINDINGS:  none  PATIENT SURVEYS:  Modified Oswestry:  MODIFIED OSWESTRY DISABILITY SCALE  Date: 04/30/24 Score  Pain intensity 4 =  Pain medication provides me with little relief from pain.  2. Personal care (washing, dressing, etc.) 1 =  I can take care of myself normally, but it increases my pain.  3. Lifting 4 = I can lift only very light weights  4. Walking 2 =  Pain prevents me from walking more than  mile.  5. Sitting 1 =  I can only sit in my favorite chair as long as I like.  6. Standing 4 =  Pain prevents me from standing more than 10 minutes.  7. Sleeping 2 =  Even when I take pain medication, I sleep less than 6 hours  8. Social Life 2 = Pain prevents me from participating in more energetic activities (eg. sports, dancing).  9. Traveling 1 =  I can travel anywhere, but it increases my pain.  10. Employment/ Homemaking 1 = My normal homemaking/job activities increase my pain, but I can still perform all that is required of me  Total 22/50   Interpretation of scores: Score Category Description  0-20%  Minimal Disability The patient can cope with most living activities. Usually no treatment is indicated apart from advice on lifting, sitting and exercise  21-40% Moderate Disability The patient experiences more pain and difficulty with sitting, lifting and standing. Travel and social life are more difficult and they may be disabled from work. Personal care, sexual activity and sleeping are not grossly affected, and the patient can usually be managed by conservative means  41-60% Severe Disability Pain remains the main problem in this group, but activities of daily living are affected. These patients require a detailed investigation  61-80% Crippled Back pain impinges on all aspects of the patient's life. Positive intervention is required  81-100% Bed-bound  These patients are either bed-bound or exaggerating their symptoms  Bluford FORBES Zoe DELENA Karon DELENA, et al. Surgery versus conservative management of stable thoracolumbar fracture: the PRESTO feasibility RCT. [place] ([place]): [employer]; 2021 Nov. [facility] Technology Assessment, No. 25.62.) Appendix 3, Oswestry Disability Index category descriptors. Available from: Findjewelers.cz  Minimally Clinically Important Difference (MCID) = 12.8%  06/17/2024 ODI 17/50 34%  COGNITION: Overall cognitive status: Within functional limits for tasks assessed     SENSATION: WFL  MUSCLE LENGTH: Hamstrings: Right 45 deg; Left 45 deg Thomas test: Right pos; Left pos  POSTURE: rounded shoulders and decreased lumbar lordosis   LUMBAR ROM:   AROM eval  Flexion WNL  Extension WNL  Right lateral flexion WNL with pain  Left lateral flexion WNL with pain  Right rotation WNL with pain  Left rotation WNL with pain   (Blank rows = not tested)  LOWER EXTREMITY ROM:     WNL  LOWER EXTREMITY MMT:    Generally 4/5 bilateral LE's with exception of bilateral hip ext 4-/5  LUMBAR SPECIAL TESTS:  Straight leg raise  test: Negative  FUNCTIONAL TESTS:  05/05/2024 5 times sit to stand: 7.18sec no UE support Timed up and go (TUG): 6.83sec  GAIT: Distance walked: 50 feet Assistive device utilized: None Level of assistance: Complete Independence Comments: Normal heel to toe progression but antalgic start up  TREATMENT DATE:  07/23/24:  Recumbent Bike Level 2 5 mins- PT present to discuss status Seated hinge 5# DB 2 x 10 Standing hinge using wall as a cue Standing hinge tracing hands down legs RDL with 5# Dbs x 10 then x 6 (patient verbalized increased low back pain) max cues for form 3 way stability ball stretch x 8 each Box lifts x 5 Prone hip extension (leg straight) x 10 each Quadruped arm raise x 5 each Quadruped fire hydrant 2 x 5 bilateral (harder stabilizing on right) Quadruped leg extension 2 x 5 bilateral  90/90 + shoulder extension with PT anchoring with green TB 2 x 10 Supine bridge with 10# plate 2 x 10 SL clamshell + TA activation 2 x 10 bilateral  Ice at the end of session x 10 mins in hooklying    07/14/24:  NuStep Level 7 - PT present to discuss status Pallof press with blue TB x 12 each direction Trunk rotation with blue TB x 12 each direction Iso pallof press + march x 12 each direction TA activation + shoulder extension with blue TB 2 x 10 Sitting on blue stability ball: LAW x 20 ; March x 20 ; alt hand and knee raise x 20 Single leg RDL with 10# KB 2 x 10 bilateral  3 way stability ball stretch x 10 each side Quadruped arm lifts x 5 each then LE extension x 5 each Bird Dog x 5 each Hooklying figure four 2 x 30 sec bilateral  Supine hip internal/ external rotation x 5 each side holding 10 sec each Single knee to chest 2 x 30 sec bilateral  Supine hamstring stretch 2 x 30sec bilateral     07/08/24: Patient was 10 min late to appointment Recumbent Bike Level 5- 5 mins PT present to discuss status 3 way stability ball stretch x 10 each side Seated figure four  stretch 2 x 30 sec bilateral  Supine hip internal/ external rotation x 5 each side holding 10 sec each Supine hamstring stretch 2 x 30sec bilateral  Hooklying 90/90 toe tap 2 x  20 total with 6# DB overhead  Bridge 2 x 10 with purple ball in between legs  Dying bug x 20 unsupported Side plank x 10 bilateral  Mountain climber on table 2 x 20  Pallof press at cable column blue tband with handles each direction Single arm row using blue tband with handles 2 x 10 bilateral  Standing chop x 12 each with 15 lbs    PATIENT EDUCATION:  Education details: Initiated HEP and educated on proper posture and body mechanics for safe lifting and caring for her newborn and 31 month old.   Person educated: Patient Education method: Explanation, Demonstration, Verbal cues, and Handouts Education comprehension: verbalized understanding, returned demonstration, and verbal cues required  HOME EXERCISE PROGRAM: Access Code: [email or web address] URL: [email or web address] Date: 06/24/2024 Prepared by: Kristeen Sar  Exercises - Standing Hamstring Stretch on Chair  - 1 x daily - 7 x weekly - 1 sets - 3 reps - 30 sec hold - Theatre Manager with Chair and Counter Support  - 1 x daily - 7 x weekly - 1 sets - 3 reps - 30 sec hold - Seated Figure 4 Piriformis Stretch  - 1 x daily - 7 x weekly - 1 sets - 3 reps - 30 sed hold - Supine Posterior Pelvic Tilt  - 1 x daily - 7 x weekly - 2 sets - 10 reps - Supine March with Posterior Pelvic Tilt  - 1 x daily - 7 x weekly - 1 sets - 10 reps - Supine Alternating Knee Taps with Hands  - 1 x daily - 7 x weekly - 1 sets - 10 reps - Standing Anti-Rotation Press with Anchored Resistance  - 1 x daily - 7 x weekly - 1 sets - 15 reps - Resistance Pulldown with March  - 1 x daily - 7 x weekly - 1-2 sets - 10 reps - Supine 90/90 Alternating Heel Touches with Posterior Pelvic Tilt  - 1 x daily - 7 x weekly - 2 sets - 10 reps - Supine Dead Bug with Leg Extension  - 1 x daily - 7  x weekly - 2 sets - 5 reps - Sidelying Forearm Plank with Knees Bent  - 1 x daily - 7 x weekly - 1 sets - 10 reps - [employer] on Counter  - 1 x daily - 7 x weekly - 2 sets - 10 reps ASSESSMENT:  CLINICAL IMPRESSION: Ashaunte verbalized increased back over the last week with bending activities. Treatment session focused on correct hinging and bending technique to protect lumbar spine. PT provided verbal and visual cues for correct performance. Patient verbalized increased back pain after 5 repetitions with added weight of hinging technique. Discussed potential cause could be decreased lumbar stability, so incorporated those exercises today. Overall, patient tolerated treatment session well. Incorporated ice at end of session since patient's back was a little flared up and she tolerated that well. Patient will benefit from skilled PT to address the below impairments and improve overall function.   OBJECTIVE IMPAIRMENTS: decreased knowledge of condition, difficulty walking, decreased ROM, decreased strength, increased fascial restrictions, increased muscle spasms, impaired flexibility, improper body mechanics, postural dysfunction, obesity, and pain.   ACTIVITY LIMITATIONS: carrying, lifting, bending, standing, squatting, sleeping, stairs, transfers, bed mobility, bathing, toileting, dressing, hygiene/grooming, and caring for others  PARTICIPATION LIMITATIONS: meal prep, cleaning, laundry, shopping, and community activity  PERSONAL FACTORS: Fitness and 1-2 comorbidities: diabetes, obesity are also affecting patient's functional outcome.   REHAB POTENTIAL: Good  CLINICAL DECISION MAKING: Stable/uncomplicated  EVALUATION COMPLEXITY: Low   GOALS: Goals reviewed with patient? Yes  SHORT TERM GOALS: Target date: 05/28/2024  Pain report to be no greater than 4/10  Baseline: Goal status: MET  2.  Patient will be independent with initial HEP  Baseline:  Goal status: MET    LONG TERM  GOALS: Target date: 07/31/2024  Patient to report pain no greater than 2/10  Baseline:  Goal status: IN PROGRESS 06/17/2024  2.  Patient to be independent with advanced HEP  Baseline:  Goal status: IN PROGRESS 06/17/2024  3.  Patient to be able to stand or walk for at least 15 min without back and hip pain to be able to shower and stand to change her childrens diapers Baseline:  Goal status: MET 06/17/2024  4.  Patient to be able to bend, stoop and squat with pain no greater than 2/10 to be able to dress and bathe her children and do normal household duties Baseline:  Goal status: IN PROGRESS (still uncomfortable, but form is improving)06/17/2024  5.  ODI to be 12 or less (less than 24%) Baseline:  Goal status: IN PROGRESS (decreased since eval) 06/17/2024  6.  Patient to report 85% improvement in overall symptoms Baseline:  Goal status: IN PROGRESS (60%) 06/17/2024  7.  Patient will be able to walk for at least 30 minutes to be able to grocery shop with minimal back discomfort. Baseline: discomfort at 20 mins Goal status: NEW   PLAN:  PT FREQUENCY: 1-2x/week  PT DURATION: 6 weeks  PLANNED INTERVENTIONS: 97110-Therapeutic exercises, 97530- Therapeutic activity, [id number]- Neuromuscular re-education, 97535- Self Care, [id number]- Manual therapy, [phone]- Gait training, [phone]- Aquatic Therapy, [phone]- Electrical stimulation (unattended), [phone]- Electrical stimulation (manual), [id number]- Ultrasound, [id number]- Traction (mechanical), [id number]- Ionotophoresis 4mg /ml Dexamethasone , Patient/Family education, Balance training, Stair training, Taping, Joint mobilization, Spinal mobilization, Cryotherapy, and Moist heat.  PLAN FOR NEXT SESSION: see how she tolerated treatment session; update HEP with lumbar stability exercises; Core strengthening, lumbar mobility; standing posture, flexibility   Kristeen Sar, PT, DPT 07/23/24 3:31 PM [facility] [address] [place], [place] [address] Phone # [phone] Fax [phone]

## 2024-07-28 ENCOUNTER — Encounter: Payer: Self-pay | Admitting: Physical Therapy

## 2024-07-28 ENCOUNTER — Ambulatory Visit: Admitting: Physical Therapy

## 2024-07-28 DIAGNOSIS — M5459 Other low back pain: Secondary | ICD-10-CM

## 2024-07-28 DIAGNOSIS — R293 Abnormal posture: Secondary | ICD-10-CM | POA: Diagnosis not present

## 2024-07-28 DIAGNOSIS — R262 Difficulty in walking, not elsewhere classified: Secondary | ICD-10-CM | POA: Diagnosis not present

## 2024-07-28 DIAGNOSIS — R252 Cramp and spasm: Secondary | ICD-10-CM

## 2024-07-28 DIAGNOSIS — M6281 Muscle weakness (generalized): Secondary | ICD-10-CM

## 2024-07-28 NOTE — Therapy (Signed)
 OUTPATIENT PHYSICAL THERAPY THORACOLUMBAR TREATMENT   Patient Name: Susan Branch MRN: [id number] DOB:[birth year], 28 y.o., female Today's Date: 07/28/2024  END OF SESSION:  PT End of Session - 07/28/24 1515     Visit Number 11    Date for Recertification  07/31/24    Authorization Type [employer] approved 6 visits 06/29/24-08/27/24 auth#[id number]    Authorization Time Period 06/29/24-08/27/24    Authorization - Visit Number 4    Authorization - Number of Visits 6    PT Start Time 1418   Arrival time   PT Stop Time 1456    PT Time Calculation (min) 38 min    Activity Tolerance Patient tolerated treatment well    Behavior During Therapy WFL for tasks assessed/performed                  Past Medical History:  Diagnosis Date   Allergy    Anxiety    Borderline hypertension    no current med.   Gestational diabetes    Headache    Tonsillar and adenoid hypertrophy 05/2013   snores during sleep, mother denies apnea   Past Surgical History:  Procedure Laterality Date   CESAREAN SECTION N/A 08/15/2022   Procedure: CESAREAN SECTION;  Surgeon: Delana Ted Morrison, DO;  Location: [facility];  Service: Obstetrics;  Laterality: N/A;   CESAREAN SECTION N/A 01/18/2024   Procedure: CESAREAN DELIVERY;  Surgeon: Delana Ted Morrison, DO;  Location: [facility];  Service: Obstetrics;  Laterality: N/A;   CHOLECYSTECTOMY N/A 12/30/2022   Procedure: LAPAROSCOPIC CHOLECYSTECTOMY;  Surgeon: Aron Shoulders, MD;  Location: [facility];  Service: General;  Laterality: N/A;   TONSILLECTOMY AND ADENOIDECTOMY N/A 06/02/2013   Procedure: TONSILLECTOMY AND ADENOIDECTOMY;  Surgeon: Ana LELON Moccasin, MD;  Location: [facility];  Service: ENT;  Laterality: N/A;   TURBINATE REDUCTION Bilateral 06/02/2013   Procedure: TURBINATE REDUCTION BILATERAL;  Surgeon: Ana LELON Moccasin, MD;  Location: [facility];  Service: ENT;  Laterality: Bilateral;   WISDOM TOOTH EXTRACTION     all four 3/24    Patient Active Problem List   Diagnosis Date Noted   Acute midline low back pain without sciatica 04/16/2024   Previous cesarean delivery affecting pregnancy 01/18/2024   Encounter for induction of labor 08/15/2022   Carrier of spinal muscular atrophy 02/26/2022   Elevated blood pressure reading with diagnosis of hypertension 02/16/2022   GBS bacteriuria 02/16/2022   Supervision of normal first pregnancy 02/13/2022   GAD (generalized anxiety disorder) 10/01/2018   Depression, major, single episode, moderate (HCC) 09/25/2017   Obesity 04/22/2014   Acne vulgaris 04/22/2014   Migraine headache 04/22/2014    PCP: Zarwolo, Gloria, FNP  REFERRING PROVIDER: Delana Ted Morrison, DO  REFERRING DIAG: M54.50 (ICD-10-CM) - Low back pain, unspecified  Rationale for Evaluation and Treatment: Rehabilitation  THERAPY DIAG:  Other low back pain  Muscle weakness (generalized)  Difficulty in walking, not elsewhere classified  Cramp and spasm  Abnormal posture  ONSET DATE: 04/29/2024  SUBJECTIVE:  SUBJECTIVE STATEMENT: Patient reports she is good today. No pain currently. After ice last session pain subsided.   From Eval: Patient reports back pain since the beginning of her first pregnancy around 2 years ago.  This pain worsened after her 2nd c-section.  She reports this is worse with standing or bending fwd to care for her children.  She rates her pain at 2/10 with sitting still and as much as 9/10 with activity.  She describes moments of very sharp pain that take her breath.    PERTINENT HISTORY:  2 pregnancies and both C sections in the past 2 years.   PAIN:  Are you having pain? Yes: NPRS scale: 2/10 sitting still now, but up to 8-9/10 at worst since last visit but I was cleaning and cooking all  day Pain location: lumbar Pain description: sharp at times but generally aching Aggravating factors: standing bending fwd Relieving factors: rest, heat  PRECAUTIONS: None  RED FLAGS: None   WEIGHT BEARING RESTRICTIONS: No  FALLS:  Has patient fallen in last 6 months? 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  DIAGNOSTIC FINDINGS:  none  PATIENT SURVEYS:  Modified Oswestry:  MODIFIED OSWESTRY DISABILITY SCALE  Date: 04/30/24 Score  Pain intensity 4 =  Pain medication provides me with little relief from pain.  2. Personal care (washing, dressing, etc.) 1 =  I can take care of myself normally, but it increases my pain.  3. Lifting 4 = I can lift only very light weights  4. Walking 2 =  Pain prevents me from walking more than  mile.  5. Sitting 1 =  I can only sit in my favorite chair as long as I like.  6. Standing 4 =  Pain prevents me from standing more than 10 minutes.  7. Sleeping 2 =  Even when I take pain medication, I sleep less than 6 hours  8. Social Life 2 = Pain prevents me from participating in more energetic activities (eg. sports, dancing).  9. Traveling 1 =  I can travel anywhere, but it increases my pain.  10. Employment/ Homemaking 1 = My normal homemaking/job activities increase my pain, but I can still perform all that is required of me  Total 22/50   Interpretation of scores: Score Category Description  0-20% Minimal Disability  The patient can cope with most living activities. Usually no treatment is indicated apart from advice on lifting, sitting and exercise  21-40% Moderate Disability The patient experiences more pain and difficulty with sitting, lifting and standing. Travel and social life are more difficult and they may be disabled from work. Personal care, sexual activity and sleeping are not grossly affected, and the patient can usually be managed by conservative means  41-60% Severe Disability Pain remains the main problem in this group, but activities of daily living are affected. These patients require a detailed investigation  61-80% Crippled Back pain impinges on all aspects of the patient's life. Positive intervention is required  81-100% Bed-bound  These patients are either bed-bound  or exaggerating their symptoms  Bluford FORBES Zoe DELENA Karon DELENA, et al. Surgery versus conservative management of stable thoracolumbar fracture: the PRESTO feasibility RCT. [place] ([place]): [employer]; 2021 Nov. [facility] Technology Assessment, No. 25.62.) Appendix 3, Oswestry Disability Index category descriptors. Available from: [email or web address].cz  Minimally Clinically Important Difference (MCID) = 12.8%  06/17/2024 ODI 17/50 34%  COGNITION: Overall cognitive status: Within functional limits for tasks assessed     SENSATION: WFL  MUSCLE LENGTH: Hamstrings: Right 45 deg; Left 45 deg Thomas test: Right pos; Left pos  POSTURE: rounded shoulders and decreased lumbar lordosis   LUMBAR ROM:   AROM eval  Flexion WNL  Extension WNL  Right lateral flexion WNL with pain  Left lateral flexion WNL with pain  Right rotation WNL with pain  Left rotation WNL with pain   (Blank rows = not tested)  LOWER EXTREMITY ROM:     WNL  LOWER EXTREMITY MMT:    Generally 4/5 bilateral LE's with exception of bilateral hip ext 4-/5  LUMBAR SPECIAL TESTS:  Straight leg raise test:  Negative  FUNCTIONAL TESTS:  05/05/2024 5 times sit to stand: 7.18sec no UE support Timed up and go (TUG): 6.83sec  GAIT: Distance walked: 50 feet Assistive device utilized: None Level of assistance: Complete Independence Comments: Normal heel to toe progression but antalgic start up  TREATMENT DATE:  07/28/24:  Recumbent Bike Level 2 5 mins- PT present to discuss status Seated hinge 5# DB  x 10 Box lifts x 5 (uncomfortable last rep) Deadlift with red TB 2 x 5 (no back pain) RDL with 6# Dbs 2 x 5 Pallof press with blue TB x 12 each direction Shoulder extension + march with red TB 2 x 5 bilateral  Standing trunk rotation with blue TB x 10 each direction Standing row with blue TB 2 x 10 Quadruped leg extension x 5 bilateral  Quadruped arm raise x 5 each Quadruped fire hydrant x 5 Seated figure four 2 x 30 sec bilateral  Ice at the end of session x 10 mins in hooklying     07/23/24:  Recumbent Bike Level 2 5 mins- PT present to discuss status Seated hinge 5# DB 2 x 10 Standing hinge using wall as a cue Standing hinge tracing hands down legs RDL with 5# Dbs x 10 then x 6 (patient verbalized increased low back pain) max cues for form 3 way stability ball stretch x 8 each Box lifts x 5 Prone hip extension (leg straight) x 10 each Quadruped arm raise x 5 each Quadruped fire hydrant 2 x 5 bilateral (harder stabilizing on right) Quadruped leg extension 2 x 5 bilateral  90/90 + shoulder extension with PT anchoring with green TB 2 x 10 Supine bridge with 10# plate 2 x 10 SL clamshell + TA activation 2 x 10 bilateral  Ice at the end of session x 10 mins in hooklying    07/14/24:  NuStep Level 7 - PT present to discuss status Pallof press with blue TB x 12 each direction Trunk rotation with blue TB x 12 each direction Iso pallof press + march x 12 each direction TA activation + shoulder extension with blue TB 2 x 10 Sitting on blue stability ball: LAW x 20 ; March x 20  ; alt hand and knee raise x 20 Single leg RDL with 10# KB 2 x 10 bilateral  3 way stability ball stretch x 10 each side Quadruped arm lifts x 5 each then LE  extension x 5 each Bird Dog x 5 each Hooklying figure four 2 x 30 sec bilateral  Supine hip internal/ external rotation x 5 each side holding 10 sec each Single knee to chest 2 x 30 sec bilateral  Supine hamstring stretch 2 x 30sec bilateral     07/08/24: Patient was 10 min late to appointment Recumbent Bike Level 5- 5 mins PT present to discuss status 3 way stability ball stretch x 10 each side Seated figure four stretch 2 x 30 sec bilateral  Supine hip internal/ external rotation x 5 each side holding 10 sec each Supine hamstring stretch 2 x 30sec bilateral  Hooklying 90/90 toe tap 2 x 20 total with 6# DB overhead  Bridge 2 x 10 with purple ball in between legs  Dying bug x 20 unsupported Side plank x 10 bilateral  Mountain climber on table 2 x 20  Pallof press at cable column blue tband with handles each direction Single arm row using blue tband with handles 2 x 10 bilateral  Standing chop x 12 each with 15 lbs    PATIENT EDUCATION:  Education details: Initiated HEP and educated on proper posture and body mechanics for safe lifting and caring for her newborn and 58 month old.   Person educated: Patient Education method: Explanation, Demonstration, Verbal cues, and Handouts Education comprehension: verbalized understanding, returned demonstration, and verbal cues required  HOME EXERCISE PROGRAM: Access Code: [email or web address] URL: [email or web address] Date: 07/28/2024 Prepared by: Kristeen Sar  Exercises - Standing Hamstring Stretch on Chair  - 1 x daily - 7 x weekly - 1 sets - 3 reps - 30 sec hold - Theatre Manager with Chair and Counter Support  - 1 x daily - 7 x weekly - 1 sets - 3 reps - 30 sec hold - Seated Figure 4 Piriformis Stretch  - 1 x daily - 7 x weekly - 1 sets - 3 reps - 30 sed hold - Supine  Posterior Pelvic Tilt  - 1 x daily - 7 x weekly - 2 sets - 10 reps - Supine March with Posterior Pelvic Tilt  - 1 x daily - 7 x weekly - 1 sets - 10 reps - Supine Alternating Knee Taps with Hands  - 1 x daily - 7 x weekly - 1 sets - 10 reps - Standing Anti-Rotation Press with Anchored Resistance  - 1 x daily - 7 x weekly - 1 sets - 15 reps - Resistance Pulldown with March  - 1 x daily - 7 x weekly - 1-2 sets - 10 reps - Supine 90/90 Alternating Heel Touches with Posterior Pelvic Tilt  - 1 x daily - 7 x weekly - 2 sets - 10 reps - Supine Dead Bug with Leg Extension  - 1 x daily - 7 x weekly - 2 sets - 5 reps - Sidelying Forearm Plank with Knees Bent  - 1 x daily - 7 x weekly - 1 sets - 10 reps - [employer] on Counter  - 1 x daily - 7 x weekly - 2 sets - 10 reps - Quadruped Alternating Arm Lift  - 1 x daily - 7 x weekly - 1 sets - 5-8 reps - Quadruped Alternating Leg Extensions  - 1 x daily - 7 x weekly - 1 sets - 5-8 reps - Bird Dog  - 1 x daily - 7 x weekly - 1 sets - 10 reps - Quadruped Fire Hydrant  - 1 x daily - 7 x weekly -  1-2 sets - 5-8 reps - Deadlift with Resistance  - 1 x daily - 7 x weekly - 1-2 sets - 5-8 reps - Clamshell  - 1 x daily - 7 x weekly - 2 sets - 10 reps - Supine Bridge  - 1 x daily - 7 x weekly - 2 sets - 10 reps ASSESSMENT:  CLINICAL IMPRESSION: Patient presents with no currently. She had increased back pain during last session, but with ice her pain levels subsided. Today we continued to work on hinging and lifting technique. Decreased repetitions compared to last session and patient did not verbalize any pain. She performed well with v/c to drive through her feet to stand up. Overall, she tolerated treatment session well. She had instances of back discomfort but it was not long lasting.Updated HEP to include exercise progressions.  Patient demonstrates good rehab potential to achieve stated goals through skilled therapy intervention.     OBJECTIVE IMPAIRMENTS:  decreased knowledge of condition, difficulty walking, decreased ROM, decreased strength, increased fascial restrictions, increased muscle spasms, impaired flexibility, improper body mechanics, postural dysfunction, obesity, and pain.   ACTIVITY LIMITATIONS: carrying, lifting, bending, standing, squatting, sleeping, stairs, transfers, bed mobility, bathing, toileting, dressing, hygiene/grooming, and caring for others  PARTICIPATION LIMITATIONS: meal prep, cleaning, laundry, shopping, and community activity  PERSONAL FACTORS: Fitness and 1-2 comorbidities: diabetes, obesity are also affecting patient's functional outcome.   REHAB POTENTIAL: Good  CLINICAL DECISION MAKING: Stable/uncomplicated  EVALUATION COMPLEXITY: Low   GOALS: Goals reviewed with patient? Yes  SHORT TERM GOALS: Target date: 05/28/2024  Pain report to be no greater than 4/10  Baseline: Goal status: MET  2.  Patient will be independent with initial HEP  Baseline:  Goal status: MET    LONG TERM GOALS: Target date: 07/31/2024  Patient to report pain no greater than 2/10  Baseline:  Goal status: IN PROGRESS 06/17/2024  2.  Patient to be independent with advanced HEP  Baseline:  Goal status: IN PROGRESS 06/17/2024  3.  Patient to be able to stand or walk for at least 15 min without back and hip pain to be able to shower and stand to change her childrens diapers Baseline:  Goal status: MET 06/17/2024  4.  Patient to be able to bend, stoop and squat with pain no greater than 2/10 to be able to dress and bathe her children and do normal household duties Baseline:  Goal status: IN PROGRESS (still uncomfortable, but form is improving)06/17/2024  5.  ODI to be 12 or less (less than 24%) Baseline:  Goal status: IN PROGRESS (decreased since eval) 06/17/2024  6.  Patient to report 85% improvement in overall symptoms Baseline:  Goal status: IN PROGRESS (60%) 06/17/2024  7.  Patient will be able to walk for at least 30  minutes to be able to grocery shop with minimal back discomfort. Baseline: discomfort at 20 mins Goal status: NEW   PLAN:  PT FREQUENCY: 1-2x/week  PT DURATION: 6 weeks  PLANNED INTERVENTIONS: 97110-Therapeutic exercises, 97530- Therapeutic activity, [id number]- Neuromuscular re-education, 97535- Self Care, [id number]- Manual therapy, [phone]- Gait training, [phone]- Aquatic Therapy, [phone]- Electrical stimulation (unattended), [id number]- Electrical stimulation (manual), [id number]- Ultrasound, [id number]- Traction (mechanical), [id number]- Ionotophoresis 4mg /ml Dexamethasone , Patient/Family education, Balance training, Stair training, Taping, Joint mobilization, Spinal mobilization, Cryotherapy, and Moist heat.  PLAN FOR NEXT SESSION: recert next session to cover last insurance visit (08/27/2024); Core strengthening, lumbar mobility; standing posture, flexibility   Kristeen Sar, PT, DPT 07/28/24 3:16 PM [facility] [address]  [address] [place], [place] [address] Phone # [phone] Fax [phone]

## 2024-07-30 ENCOUNTER — Encounter: Payer: Self-pay | Admitting: Physical Therapy

## 2024-07-30 ENCOUNTER — Ambulatory Visit: Admitting: Physical Therapy

## 2024-07-30 DIAGNOSIS — M6281 Muscle weakness (generalized): Secondary | ICD-10-CM

## 2024-07-30 DIAGNOSIS — R293 Abnormal posture: Secondary | ICD-10-CM

## 2024-07-30 DIAGNOSIS — R262 Difficulty in walking, not elsewhere classified: Secondary | ICD-10-CM | POA: Diagnosis not present

## 2024-07-30 DIAGNOSIS — M5459 Other low back pain: Secondary | ICD-10-CM | POA: Diagnosis not present

## 2024-07-30 DIAGNOSIS — R252 Cramp and spasm: Secondary | ICD-10-CM

## 2024-07-30 NOTE — Therapy (Signed)
 OUTPATIENT PHYSICAL THERAPY THORACOLUMBAR TREATMENT   Patient Name: Susan Branch MRN: [id number] DOB:1996/08/22, 28 y.o., female Today's Date: 07/30/2024  END OF SESSION:  PT End of Session - 07/30/24 1447     Visit Number 12    Date for Recertification  08/28/24    Authorization Type [employer] approved 6 visits 06/29/24-08/27/24 auth#[id number]    Authorization Time Period 06/29/24-08/27/24    Authorization - Visit Number 5    Authorization - Number of Visits 6    PT Start Time 1410    PT Stop Time 1446    PT Time Calculation (min) 36 min    Activity Tolerance Patient tolerated treatment well    Behavior During Therapy WFL for tasks assessed/performed                   Past Medical History:  Diagnosis Date   Allergy    Anxiety    Borderline hypertension    no current med.   Gestational diabetes    Headache    Tonsillar and adenoid hypertrophy 05/2013   snores during sleep, mother denies apnea   Past Surgical History:  Procedure Laterality Date   CESAREAN SECTION N/A 08/15/2022   Procedure: CESAREAN SECTION;  Surgeon: Delana Ted Morrison, DO;  Location: [facility];  Service: Obstetrics;  Laterality: N/A;   CESAREAN SECTION N/A 01/18/2024   Procedure: CESAREAN DELIVERY;  Surgeon: Delana Ted Morrison, DO;  Location: [facility];  Service: Obstetrics;  Laterality: N/A;   CHOLECYSTECTOMY N/A 12/30/2022   Procedure: LAPAROSCOPIC CHOLECYSTECTOMY;  Surgeon: Aron Shoulders, MD;  Location: [facility];  Service: General;  Laterality: N/A;   TONSILLECTOMY AND ADENOIDECTOMY N/A 06/02/2013   Procedure: TONSILLECTOMY AND ADENOIDECTOMY;  Surgeon: Ana LELON Moccasin, MD;  Location: [facility];  Service: ENT;  Laterality: N/A;   TURBINATE REDUCTION Bilateral 06/02/2013   Procedure: TURBINATE REDUCTION BILATERAL;  Surgeon: Ana LELON Moccasin, MD;  Location: [facility];  Service: ENT;  Laterality: Bilateral;   WISDOM TOOTH EXTRACTION     all four 3/24   Patient Active  Problem List   Diagnosis Date Noted   Acute midline low back pain without sciatica 04/16/2024   Previous cesarean delivery affecting pregnancy 01/18/2024   Encounter for induction of labor 08/15/2022   Carrier of spinal muscular atrophy 02/26/2022   Elevated blood pressure reading with diagnosis of hypertension 02/16/2022   GBS bacteriuria 02/16/2022   Supervision of normal first pregnancy 02/13/2022   GAD (generalized anxiety disorder) 10/01/2018   Depression, major, single episode, moderate (HCC) 09/25/2017   Obesity 04/22/2014   Acne vulgaris 04/22/2014   Migraine headache 04/22/2014    PCP: Zarwolo, Gloria, FNP  REFERRING PROVIDER: Delana Ted Morrison, DO  REFERRING DIAG: M54.50 (ICD-10-CM) - Low back pain, unspecified  Rationale for Evaluation and Treatment: Rehabilitation  THERAPY DIAG:  Other low back pain  Muscle weakness (generalized)  Difficulty in walking, not elsewhere classified  Cramp and spasm  Abnormal posture  ONSET DATE: 04/29/2024  SUBJECTIVE:  SUBJECTIVE STATEMENT: Patient reports she feeling 40% better since starting skilled therapy. No pain currently  From Eval: Patient reports back pain since the beginning of her first pregnancy around 2 years ago.  This pain worsened after her 2nd c-section.  She reports this is worse with standing or bending fwd to care for her children.  She rates her pain at 2/10 with sitting still and as much as 9/10 with activity.  She describes moments of very sharp pain that take her breath.    PERTINENT HISTORY:  2 pregnancies and both C sections in the past 2 years.   PAIN:  Are you having pain? Yes: NPRS scale: 2/10 sitting still now, but up to 8-9/10 at worst since last visit but I was cleaning and cooking all day Pain location:  lumbar Pain description: sharp at times but generally aching Aggravating factors: standing bending fwd Relieving factors: rest, heat  PRECAUTIONS: None  RED FLAGS: None   WEIGHT BEARING RESTRICTIONS: No  FALLS:  Has patient fallen in last 6 months? 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  DIAGNOSTIC FINDINGS:  none  PATIENT SURVEYS:  Modified Oswestry:  MODIFIED OSWESTRY DISABILITY SCALE  Date: 04/30/24 Score  Pain intensity 4 =  Pain medication provides me with little relief from pain.  2. Personal care (washing, dressing, etc.) 1 =  I can take care of myself normally, but it increases my pain.  3. Lifting 4 = I can lift only very light weights  4. Walking 2 =  Pain prevents me from walking more than  mile.  5. Sitting 1 =  I can only sit in my favorite chair as long as I like.  6. Standing 4 =  Pain prevents me from standing more than 10 minutes.  7. Sleeping 2 =  Even when I take pain medication, I sleep less than 6 hours  8. Social Life 2 = Pain prevents me from participating in more energetic activities (eg. sports, dancing).  9. Traveling 1 =  I can travel anywhere, but it increases my pain.  10. Employment/ Homemaking 1 = My normal homemaking/job activities increase my pain, but I can still perform all that is required of me  Total 22/50   Interpretation of scores: Score Category Description  0-20% Minimal Disability The patient can cope  with most living activities. Usually no treatment is indicated apart from advice on lifting, sitting and exercise  21-40% Moderate Disability The patient experiences more pain and difficulty with sitting, lifting and standing. Travel and social life are more difficult and they may be disabled from work. Personal care, sexual activity and sleeping are not grossly affected, and the patient can usually be managed by conservative means  41-60% Severe Disability Pain remains the main problem in this group, but activities of daily living are affected. These patients require a detailed investigation  61-80% Crippled Back pain impinges on all aspects of the patient's life. Positive intervention is required  81-100% Bed-bound  These patients are either bed-bound or exaggerating their  symptoms  Bluford BRAVO, [place] DELENA Karon DELENA, et al. Surgery versus conservative management of stable thoracolumbar fracture: the PRESTO feasibility RCT. [place] ([place]): [employer]; 2021 Nov. [facility] Technology Assessment, No. 25.62.) Appendix 3, Oswestry Disability Index category descriptors. Available from: [email or web address].cz  Minimally Clinically Important Difference (MCID) = 12.8%  06/17/2024 ODI 17/50 34%  07/30/2024 ODI 20/50 40%  COGNITION: Overall cognitive status: Within functional limits for tasks assessed     SENSATION: WFL  MUSCLE LENGTH: Hamstrings: Right 45 deg; Left 45 deg Thomas test: Right pos; Left pos  POSTURE: rounded shoulders and decreased lumbar lordosis   LUMBAR ROM:   AROM eval 07/30/2024  Flexion WNL   Extension WNL   Right lateral flexion WNL with pain WNL no pain today  Left lateral flexion WNL with pain WNL no pain today  Right rotation WNL with pain WNL no pain today  Left rotation WNL with pain WNL no pain today   (Blank rows = not tested)  LOWER EXTREMITY ROM:     WNL  LOWER EXTREMITY MMT:    Generally 4/5 bilateral LE's with exception  of bilateral hip ext 4-/5  LUMBAR SPECIAL TESTS:  Straight leg raise test: Negative  FUNCTIONAL TESTS:  05/05/2024 5 times sit to stand: 7.18sec no UE support Timed up and go (TUG): 6.83sec  GAIT: Distance walked: 50 feet Assistive device utilized: None Level of assistance: Complete Independence Comments: Normal heel to toe progression but antalgic start up  TREATMENT DATE:  07/30/24:  Recumbent Bike Level 2 5 mins- PT present to discuss status ODI :20/50 40% ROM assessment Quadruped TA activation x 10  Quadruped fire hydrant x 10 Quadruped leg extension x 10 bilateral  Bird dog 2 x 5 each  Half kneeling pallof press with blue TB x 12 each Hooklying 90/90 + shoulder extension (PT anchoring) 2 x 10 Bridges with 25# 2 x 10 SL clamshell with red 2 x 10 bilateral  Standing row with blue TB 2 x 10 with TA activation  Standing extension  with blue TB 2 x 10 with TA activation  Standing hamstring stretch at stair 2 x 30 sec bilateral     07/28/24:  Recumbent Bike Level 2 5 mins- PT present to discuss status Seated hinge 5# DB  x 10 Box lifts x 5 (uncomfortable last rep) Deadlift with red TB 2 x 5 (no back pain) RDL with 6# Dbs 2 x 5 Pallof press with blue TB x 12 each direction Shoulder extension + march with red TB 2 x 5 bilateral  Standing trunk rotation with blue TB x 10 each direction Standing row with blue TB 2 x 10 Quadruped leg extension x 5 bilateral  Quadruped arm raise x 5 each Quadruped fire hydrant x 5 Seated figure four 2 x 30 sec bilateral  Ice at the end of session x 10 mins in hooklying     07/23/24:  Recumbent Bike Level 2 5 mins- PT present to discuss status Seated hinge 5# DB 2 x 10 Standing hinge using wall as a cue Standing hinge tracing hands down legs RDL with 5# Dbs x 10 then x 6 (patient verbalized increased low back pain) max cues for form 3 way stability ball stretch x 8 each Box lifts x 5 Prone hip extension (leg straight) x 10  each Quadruped arm raise x 5 each Quadruped fire hydrant 2 x 5 bilateral (harder stabilizing on right) Quadruped leg extension 2 x 5 bilateral  90/90 + shoulder extension  with PT anchoring with green TB 2 x 10 Supine bridge with 10# plate 2 x 10 SL clamshell + TA activation 2 x 10 bilateral  Ice at the end of session x 10 mins in hooklying     PATIENT EDUCATION:  Education details: Initiated HEP and educated on proper posture and body mechanics for safe lifting and caring for her newborn and 44 month old.   Person educated: Patient Education method: Explanation, Demonstration, Verbal cues, and Handouts Education comprehension: verbalized understanding, returned demonstration, and verbal cues required  HOME EXERCISE PROGRAM: Access Code: [email or web address] URL: [email or web address] Date: 07/28/2024 Prepared by: Kristeen Sar  Exercises - Standing Hamstring Stretch on Chair  - 1 x daily - 7 x weekly - 1 sets - 3 reps - 30 sec hold - Theatre Manager with Chair and Counter Support  - 1 x daily - 7 x weekly - 1 sets - 3 reps - 30 sec hold - Seated Figure 4 Piriformis Stretch  - 1 x daily - 7 x weekly - 1 sets - 3 reps - 30 sed hold - Supine Posterior Pelvic Tilt  - 1 x daily - 7 x weekly - 2 sets - 10 reps - Supine March with Posterior Pelvic Tilt  - 1 x daily - 7 x weekly - 1 sets - 10 reps - Supine Alternating Knee Taps with Hands  - 1 x daily - 7 x weekly - 1 sets - 10 reps - Standing Anti-Rotation Press with Anchored Resistance  - 1 x daily - 7 x weekly - 1 sets - 15 reps - Resistance Pulldown with March  - 1 x daily - 7 x weekly - 1-2 sets - 10 reps - Supine 90/90 Alternating Heel Touches with Posterior Pelvic Tilt  - 1 x daily - 7 x weekly - 2 sets - 10 reps - Supine Dead Bug with Leg Extension  - 1 x daily - 7 x weekly - 2 sets - 5 reps - Sidelying Forearm Plank with Knees Bent  - 1 x daily - 7 x weekly - 1 sets - 10 reps - [employer] on Counter  - 1 x daily - 7 x  weekly - 2 sets - 10 reps - Quadruped Alternating Arm Lift  - 1 x daily - 7 x weekly - 1 sets - 5-8 reps - Quadruped Alternating Leg Extensions  - 1 x daily - 7 x weekly - 1 sets - 5-8 reps - Bird Dog  - 1 x daily - 7 x weekly - 1 sets - 10 reps - Quadruped Fire Hydrant  - 1 x daily - 7 x weekly - 1-2 sets - 5-8 reps - Deadlift with Resistance  - 1 x daily - 7 x weekly - 1-2 sets - 5-8 reps - Clamshell  - 1 x daily - 7 x weekly - 2 sets - 10 reps - Supine Bridge  - 1 x daily - 7 x weekly - 2 sets - 10 reps ASSESSMENT:  CLINICAL IMPRESSION: Susan Branch verbalized feeling 40% better today, but previous assessment she felt 60% better.She still has increased back pain with grocery shopping and with bending movements. Over the last few sessions we have been working on correct hinging/ lifting technique. She is progressing well with this and verbalizes less pain with lower repetitions. Educated patient on activity modifications and demonstrated a variety of postures when completing house chores. Plan to finalize HEP next session and discharge patient.     OBJECTIVE IMPAIRMENTS: decreased  knowledge of condition, difficulty walking, decreased ROM, decreased strength, increased fascial restrictions, increased muscle spasms, impaired flexibility, improper body mechanics, postural dysfunction, obesity, and pain.   ACTIVITY LIMITATIONS: carrying, lifting, bending, standing, squatting, sleeping, stairs, transfers, bed mobility, bathing, toileting, dressing, hygiene/grooming, and caring for others  PARTICIPATION LIMITATIONS: meal prep, cleaning, laundry, shopping, and community activity  PERSONAL FACTORS: Fitness and 1-2 comorbidities: diabetes, obesity are also affecting patient's functional outcome.   REHAB POTENTIAL: Good  CLINICAL DECISION MAKING: Stable/uncomplicated  EVALUATION COMPLEXITY: Low   GOALS: Goals reviewed with patient? Yes  SHORT TERM GOALS: Target date: 05/28/2024  Pain report to be  no greater than 4/10  Baseline: Goal status: MET  2.  Patient will be independent with initial HEP  Baseline:  Goal status: MET    LONG TERM GOALS: Target date: 08/28/2024  Patient to report pain no greater than 2/10  Baseline:  Goal status: MET 07/30/2024  2.  Patient to be independent with advanced HEP  Baseline:  Goal status: IN PROGRESS 06/17/2024  3.  Patient to be able to stand or walk for at least 15 min without back and hip pain to be able to shower and stand to change her childrens diapers Baseline:  Goal status: MET 06/17/2024  4.  Patient to be able to bend, stoop and squat with pain no greater than 2/10 to be able to dress and bathe her children and do normal household duties Baseline:  Goal status: IN PROGRESS (still uncomfortable, but form is improving)07/30/2024  5.  ODI to be 12 or less (less than 24%) Baseline:  Goal status: IN PROGRESS (decreased since eval) 06/17/2024  6.  Patient to report 85% improvement in overall symptoms Baseline:  Goal status: IN PROGRESS (40%) 07/30/2024  7.  Patient will be able to walk for at least 30 minutes to be able to grocery shop with minimal back discomfort. Baseline: discomfort at 20 mins Goal status: IN PROGRESS 07/30/2024   PLAN:  PT FREQUENCY: 1-2x/week  PT DURATION: 4 weeks  PLANNED INTERVENTIONS: 97110-Therapeutic exercises, 97530- Therapeutic activity, 97112- Neuromuscular re-education, 97535- Self Care, [id number]- Manual therapy, [phone]- Gait training, [phone]- Aquatic Therapy, [phone]- Electrical stimulation (unattended), [phone]- Electrical stimulation (manual), [id number]- Ultrasound, [id number]- Traction (mechanical), [id number]- Ionotophoresis 4mg /ml Dexamethasone , Patient/Family education, Balance training, Stair training, Taping, Joint mobilization, Spinal mobilization, Cryotherapy, and Moist heat.  PLAN FOR NEXT SESSION: discharge patient; review and update HEP    Kristeen Sar, PT, DPT 07/30/24 2:47 PM [facility] [address] [place], [place] [address] Phone # [phone] Fax [phone]

## 2024-08-27 ENCOUNTER — Encounter: Payer: Self-pay | Admitting: Physical Therapy

## 2024-08-27 ENCOUNTER — Ambulatory Visit: Payer: Self-pay | Attending: Obstetrics and Gynecology | Admitting: Physical Therapy

## 2024-08-27 DIAGNOSIS — M5459 Other low back pain: Secondary | ICD-10-CM | POA: Diagnosis present

## 2024-08-27 DIAGNOSIS — R252 Cramp and spasm: Secondary | ICD-10-CM | POA: Insufficient documentation

## 2024-08-27 DIAGNOSIS — M6281 Muscle weakness (generalized): Secondary | ICD-10-CM | POA: Insufficient documentation

## 2024-08-27 DIAGNOSIS — R293 Abnormal posture: Secondary | ICD-10-CM | POA: Diagnosis present

## 2024-08-27 DIAGNOSIS — R262 Difficulty in walking, not elsewhere classified: Secondary | ICD-10-CM | POA: Insufficient documentation

## 2024-08-27 NOTE — Therapy (Signed)
 OUTPATIENT PHYSICAL THERAPY THORACOLUMBAR TREATMENT/ DISCHARGE NOTE   Patient Name: Susan Branch MRN: [id number] DOB:09/09/1996, 28 y.o., female Today's Date: 08/27/2024  END OF SESSION:  PT End of Session - 08/27/24 1323     Visit Number 13    Date for Recertification  08/28/24    Authorization Type [employer] approved 6 visits 06/29/24-08/27/24 auth#[id number]    Authorization Time Period 06/29/24-08/27/24    Authorization - Visit Number 6    Authorization - Number of Visits 6    PT Start Time 1252   arrival time   PT Stop Time 1317    PT Time Calculation (min) 25 min    Activity Tolerance Patient tolerated treatment well    Behavior During Therapy [facility] for tasks assessed/performed                    Past Medical History:  Diagnosis Date   Allergy    Anxiety    Borderline hypertension    no current med.   Gestational diabetes    Headache    Tonsillar and adenoid hypertrophy 05/2013   snores during sleep, mother denies apnea   Past Surgical History:  Procedure Laterality Date   CESAREAN SECTION N/A 08/15/2022   Procedure: CESAREAN SECTION;  Surgeon: Delana Ted Morrison, DO;  Location: [facility];  Service: Obstetrics;  Laterality: N/A;   CESAREAN SECTION N/A 01/18/2024   Procedure: CESAREAN DELIVERY;  Surgeon: Delana Ted Morrison, DO;  Location: [facility];  Service: Obstetrics;  Laterality: N/A;   CHOLECYSTECTOMY N/A 12/30/2022   Procedure: LAPAROSCOPIC CHOLECYSTECTOMY;  Surgeon: Aron Shoulders, MD;  Location: [facility] OR;  Service: General;  Laterality: N/A;   TONSILLECTOMY AND ADENOIDECTOMY N/A 06/02/2013   Procedure: TONSILLECTOMY AND ADENOIDECTOMY;  Surgeon: Ana LELON Moccasin, MD;  Location: [facility];  Service: ENT;  Laterality: N/A;   TURBINATE REDUCTION Bilateral 06/02/2013   Procedure: TURBINATE REDUCTION BILATERAL;  Surgeon: Ana LELON Moccasin, MD;  Location: [facility];  Service: ENT;  Laterality: Bilateral;   WISDOM TOOTH EXTRACTION      all four 3/24   Patient Active Problem List   Diagnosis Date Noted   Acute midline low back pain without sciatica 04/16/2024   Previous cesarean delivery affecting pregnancy 01/18/2024   Encounter for induction of labor 08/15/2022   Carrier of spinal muscular atrophy 02/26/2022   Elevated blood pressure reading with diagnosis of hypertension 02/16/2022   GBS bacteriuria 02/16/2022   Supervision of normal first pregnancy 02/13/2022   GAD (generalized anxiety disorder) 10/01/2018   Depression, major, single episode, moderate (HCC) 09/25/2017   Obesity 04/22/2014   Acne vulgaris 04/22/2014   Migraine headache 04/22/2014    PCP: Zarwolo, Gloria, FNP  REFERRING PROVIDER: Delana Ted Morrison, DO  REFERRING DIAG: M54.50 (ICD-10-CM) - Low back pain, unspecified  Rationale for Evaluation and Treatment: Rehabilitation  THERAPY DIAG:  Other low back pain  Muscle weakness (generalized)  Difficulty in walking, not elsewhere classified  Cramp and spasm  Abnormal posture  ONSET DATE: 04/29/2024  SUBJECTIVE:  SUBJECTIVE STATEMENT: Patient reports she is doing good today. She feels the exercises have been working. She feels she is able to do more.   From Eval: Patient reports back pain since the beginning of her first pregnancy around 2 years ago.  This pain worsened after her 2nd c-section.  She reports this is worse with standing or bending fwd to care for her children.  She rates her pain at 2/10 with sitting still and as much as 9/10 with activity.  She describes moments of very sharp pain that take her breath.    PERTINENT HISTORY:  2 pregnancies and both C sections in the past 2 years.   PAIN:  Are you having pain? Yes: NPRS scale: 2/10 sitting still now, but up to 8-9/10 at worst since last  visit but I was cleaning and cooking all day Pain location: lumbar Pain description: sharp at times but generally aching Aggravating factors: standing bending fwd Relieving factors: rest, heat  PRECAUTIONS: None  RED FLAGS: None   WEIGHT BEARING RESTRICTIONS: No  FALLS:  Has patient fallen in last 6 months? 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  DIAGNOSTIC FINDINGS:  none  PATIENT SURVEYS:  Modified Oswestry:  MODIFIED OSWESTRY DISABILITY SCALE  Date: 04/30/24 Score  Pain intensity 4 =  Pain medication provides me with little relief from pain.  2. Personal care (washing, dressing, etc.) 1 =  I can take care of myself normally, but it increases my pain.  3. Lifting 4 = I can lift only very light weights  4. Walking 2 =  Pain prevents me from walking more than  mile.  5. Sitting 1 =  I can only sit in my favorite chair as long as I like.  6. Standing 4 =  Pain prevents me from standing more than 10 minutes.  7. Sleeping 2 =  Even when I take pain medication, I sleep less than 6 hours  8. Social Life 2 = Pain prevents me from participating in more energetic activities (eg. sports, dancing).  9. Traveling 1 =  I can travel anywhere, but it increases my pain.  10. Employment/ Homemaking 1 = My normal homemaking/job activities increase my pain, but I can still perform all that is required of me  Total 22/50   Interpretation of scores: Score  Category Description  0-20% Minimal Disability The patient can cope with most living activities. Usually no treatment is indicated apart from advice on lifting, sitting and exercise  21-40% Moderate Disability The patient experiences more pain and difficulty with sitting, lifting and standing. Travel and social life are more difficult and they may be disabled from work. Personal care, sexual activity and sleeping are not grossly affected, and the patient can usually be managed by conservative means  41-60% Severe Disability Pain remains the main problem in this group, but activities of daily living are affected. These patients require a detailed investigation  61-80% Crippled Back pain impinges on all aspects of the patients life. Positive intervention is required  81-100%  Bed-bound  These patients are either bed-bound or exaggerating their symptoms  Bluford FORBES Zoe DELENA Karon DELENA, et al. Surgery versus conservative management of stable thoracolumbar fracture: the PRESTO feasibility RCT. [place] ([place]): [employer]; 2021 Nov. [facility] Technology Assessment, No. 25.62.) Appendix 3, Oswestry Disability Index category descriptors. Available from: [email or web address].cz  Minimally Clinically Important Difference (MCID) = 12.8%  06/17/2024 ODI 17/50 34%  07/30/2024 ODI 20/50 40%  COGNITION: Overall cognitive status: Within functional limits for tasks assessed     SENSATION: WFL  MUSCLE LENGTH: Hamstrings: Right 45 deg; Left 45 deg Thomas test: Right pos; Left pos  POSTURE: rounded shoulders and decreased lumbar lordosis   LUMBAR ROM:   AROM eval 07/30/2024  Flexion WNL   Extension WNL   Right lateral flexion WNL with pain WNL no pain today  Left lateral flexion WNL with pain WNL no pain today  Right rotation WNL with pain WNL no pain today  Left rotation WNL with pain WNL no pain today   (Blank rows = not tested)  LOWER EXTREMITY ROM:      WNL  LOWER EXTREMITY MMT:    Generally 4/5 bilateral LE's with exception of bilateral hip ext 4-/5  LUMBAR SPECIAL TESTS:  Straight leg raise test: Negative  FUNCTIONAL TESTS:  05/05/2024 5 times sit to stand: 7.18sec no UE support Timed up and go (TUG): 6.83sec  GAIT: Distance walked: 50 feet Assistive device utilized: None Level of assistance: Complete Independence Comments: Normal heel to toe progression but antalgic start up  TREATMENT DATE:  08/27/24: Patient was late to appointment Recumbent Bike Level 2 5 mins- PT present to discuss status ODI :8/50 16% Hinge to wall x 10 then no wall x 10 RDL with 8# DB 2 x 8 (decreased to 6 reps. She verbalized pain towards the end) Forward T x 6 each side Pallof press with black TB x 5 each side Review and updated of HEP    07/30/24:  Recumbent Bike Level 2 5 mins- PT present to discuss status ODI :20/50 40% ROM assessment Quadruped TA activation x 10  Quadruped fire hydrant x 10 Quadruped leg extension x 10 bilateral  Bird dog 2 x 5 each  Half kneeling pallof press with blue TB x 12 each Hooklying 90/90 + shoulder extension (PT anchoring) 2 x 10 Bridges with 25# 2 x 10 SL clamshell with red 2 x 10 bilateral  Standing row with blue TB 2 x 10 with TA activation  Standing extension  with blue TB 2 x 10 with TA activation  Standing hamstring stretch at stair 2 x 30 sec bilateral     07/28/24:  Recumbent Bike Level 2 5 mins- PT present to discuss status Seated hinge 5# DB  x 10 Box lifts x 5 (uncomfortable last rep) Deadlift with red TB 2 x 5 (no back pain) RDL with 6# Dbs 2 x 5 Pallof press with blue TB x 12 each direction Shoulder extension + march with red TB 2 x 5 bilateral  Standing trunk rotation with blue TB x 10 each direction Standing row with blue TB 2 x 10 Quadruped leg extension x 5 bilateral  Quadruped arm raise x 5 each Quadruped fire hydrant x 5 Seated figure four 2 x 30 sec bilateral  Ice at the  end of session x 10 mins in hooklying       PATIENT EDUCATION:  Education details: Initiated HEP and educated on proper posture and body mechanics for safe lifting and  caring for her newborn and 53 month old.   Person educated: Patient Education method: Explanation, Demonstration, Verbal cues, and Handouts Education comprehension: verbalized understanding, returned demonstration, and verbal cues required  HOME EXERCISE PROGRAM: Access Code: [email or web address] URL: [email or web address] Date: 08/27/2024 Prepared by: Kristeen Sar  Exercises - Standing Hamstring Stretch on Chair  - 1 x daily - 7 x weekly - 1 sets - 3 reps - 30 sec hold - Theatre Manager with Chair and Counter Support  - 1 x daily - 7 x weekly - 1 sets - 3 reps - 30 sec hold - Seated Figure 4 Piriformis Stretch  - 1 x daily - 7 x weekly - 1 sets - 3 reps - 30 sed hold - Supine Posterior Pelvic Tilt  - 1 x daily - 7 x weekly - 2 sets - 10 reps - Supine March with Posterior Pelvic Tilt  - 1 x daily - 7 x weekly - 1 sets - 10 reps - Supine Alternating Knee Taps with Hands  - 1 x daily - 7 x weekly - 1 sets - 10 reps - Standing Anti-Rotation Press with Anchored Resistance  - 1 x daily - 7 x weekly - 1 sets - 15 reps - Resistance Pulldown with March  - 1 x daily - 7 x weekly - 1-2 sets - 10 reps - Supine 90/90 Alternating Heel Touches with Posterior Pelvic Tilt  - 1 x daily - 7 x weekly - 2 sets - 10 reps - Supine Dead Bug with Leg Extension  - 1 x daily - 7 x weekly - 2 sets - 5 reps - Sidelying Forearm Plank with Knees Bent  - 1 x daily - 7 x weekly - 1 sets - 10 reps - [employer] on Counter  - 1 x daily - 7 x weekly - 2 sets - 10 reps - Quadruped Alternating Arm Lift  - 1 x daily - 7 x weekly - 1 sets - 5-8 reps - Quadruped Alternating Leg Extensions  - 1 x daily - 7 x weekly - 1 sets - 5-8 reps - Bird Dog  - 1 x daily - 7 x weekly - 1 sets - 10 reps - Quadruped Fire Hydrant  - 1 x daily - 7 x weekly - 1-2  sets - 5-8 reps - Deadlift with Resistance  - 1 x daily - 7 x weekly - 1-2 sets - 5-8 reps - Clamshell  - 1 x daily - 7 x weekly - 2 sets - 10 reps - Supine Bridge  - 1 x daily - 7 x weekly - 2 sets - 10 reps - Standing Hip Hinge  - 1 x daily - 7 x weekly - 3 sets - 10 reps - Kettlebell Deadlift  - 1 x daily - 7 x weekly - 2 sets - 6 reps - Forward T  - 1 x daily - 7 x weekly - 1 sets - 6 reps ASSESSMENT:  CLINICAL IMPRESSION: Susan Branch verbalized feeling 60% better today. She feels she is getting back to her normal physical activity level and has the ability to perform house chores and grocery shopping with minimal back pain. She has been compliant with HEP and feels those exercises help. Reviewed proper form and posture for lifting and educated patient on different postures when carrying for her children. Reviewed and updated HEP. Majority of goals met at this time. Patient to discharge home with HEP.   OBJECTIVE IMPAIRMENTS: decreased knowledge of condition, difficulty walking,  decreased ROM, decreased strength, increased fascial restrictions, increased muscle spasms, impaired flexibility, improper body mechanics, postural dysfunction, obesity, and pain.   ACTIVITY LIMITATIONS: carrying, lifting, bending, standing, squatting, sleeping, stairs, transfers, bed mobility, bathing, toileting, dressing, hygiene/grooming, and caring for others  PARTICIPATION LIMITATIONS: meal prep, cleaning, laundry, shopping, and community activity  PERSONAL FACTORS: Fitness and 1-2 comorbidities: diabetes, obesity are also affecting patient's functional outcome.   REHAB POTENTIAL: Good  CLINICAL DECISION MAKING: Stable/uncomplicated  EVALUATION COMPLEXITY: Low   GOALS: Goals reviewed with patient? Yes  SHORT TERM GOALS: Target date: 05/28/2024  Pain report to be no greater than 4/10  Baseline: Goal status: MET  2.  Patient will be independent with initial HEP  Baseline:  Goal status: MET    LONG  TERM GOALS: Target date: 08/28/2024  Patient to report pain no greater than 2/10  Baseline:  Goal status: MET 07/30/2024  2.  Patient to be independent with advanced HEP  Baseline:  Goal status: MET 08/27/2024  3.  Patient to be able to stand or walk for at least 15 min without back and hip pain to be able to shower and stand to change her childrens diapers Baseline:  Goal status: MET 06/17/2024  4.  Patient to be able to bend, stoop and squat with pain no greater than 2/10 to be able to dress and bathe her children and do normal household duties Baseline:  Goal status: MET 08/27/2024  5.  ODI to be 12 or less (less than 24%) Baseline:  Goal status: MET 08/27/2024  6.  Patient to report 85% improvement in overall symptoms Baseline:  Goal status:NOT MET (60%) 08/27/2024  7.  Patient will be able to walk for at least 30 minutes to be able to grocery shop with minimal back discomfort. Baseline: discomfort at 20 mins Goal status: MET 08/27/2024   PLAN:  PT FREQUENCY: 1-2x/week  PT DURATION: 4 weeks  PLANNED INTERVENTIONS: 97110-Therapeutic exercises, 97530- Therapeutic activity, 97112- Neuromuscular re-education, 97535- Self Care, [id number]- Manual therapy, [phone]- Gait training, [phone]- Aquatic Therapy, [phone]- Electrical stimulation (unattended), [phone]- Electrical stimulation (manual), [id number]- Ultrasound, [id number]- Traction (mechanical), [id number]- Ionotophoresis 4mg /ml Dexamethasone , Patient/Family education, Balance training, Stair training, Taping, Joint mobilization, Spinal mobilization, Cryotherapy, and Moist heat.  PLAN FOR NEXT SESSION: patient to discharge home with HEP  Kristeen Sar, PT, DPT 08/27/2024 1:24 PM [facility] [address] [place], [place] [address] Phone # [phone] Fax [phone]    PHYSICAL THERAPY DISCHARGE SUMMARY  Visits from Start of Care: 13  Current functional level related to goals / functional outcomes: See  above   Remaining deficits: Occasional back pain   Education / Equipment: See above   Patient agrees to discharge. Patient goals were partially met. Patient is being discharged due to being pleased with the current functional level.
